# Patient Record
Sex: Female | Born: 1950
Health system: Southern US, Community
[De-identification: ages and names within clinical notes are randomized; demographics above are authoritative.]

## PROBLEM LIST (undated history)

## (undated) DIAGNOSIS — F329 Major depressive disorder, single episode, unspecified: Secondary | ICD-10-CM

## (undated) DIAGNOSIS — F4310 Post-traumatic stress disorder, unspecified: Secondary | ICD-10-CM

## (undated) DIAGNOSIS — I10 Essential (primary) hypertension: Secondary | ICD-10-CM

## (undated) DIAGNOSIS — N189 Chronic kidney disease, unspecified: Secondary | ICD-10-CM

## (undated) DIAGNOSIS — K859 Acute pancreatitis without necrosis or infection, unspecified: Secondary | ICD-10-CM

## (undated) DIAGNOSIS — R001 Bradycardia, unspecified: Secondary | ICD-10-CM

## (undated) DIAGNOSIS — I509 Heart failure, unspecified: Secondary | ICD-10-CM

## (undated) DIAGNOSIS — I495 Sick sinus syndrome: Secondary | ICD-10-CM

## (undated) DIAGNOSIS — C801 Malignant (primary) neoplasm, unspecified: Secondary | ICD-10-CM

## (undated) DIAGNOSIS — F32A Depression, unspecified: Secondary | ICD-10-CM

## (undated) HISTORY — DX: Major depressive disorder, single episode, unspecified: F32.9

## (undated) HISTORY — DX: Chronic kidney disease, unspecified: N18.9

## (undated) HISTORY — PX: APPENDECTOMY: SHX54

## (undated) HISTORY — DX: Post-traumatic stress disorder, unspecified: F43.10

## (undated) HISTORY — DX: Depression, unspecified: F32.A

## (undated) HISTORY — DX: Sick sinus syndrome: I49.5

## (undated) HISTORY — PX: ABDOMINAL HYSTERECTOMY: SHX81

---

## 2011-03-08 HISTORY — PX: PACEMAKER IMPLANT: EP1218

## 2014-07-26 DIAGNOSIS — N10 Acute tubulo-interstitial nephritis: Secondary | ICD-10-CM | POA: Insufficient documentation

## 2015-07-06 DIAGNOSIS — M2041 Other hammer toe(s) (acquired), right foot: Secondary | ICD-10-CM | POA: Diagnosis not present

## 2015-07-06 DIAGNOSIS — M79671 Pain in right foot: Secondary | ICD-10-CM | POA: Diagnosis not present

## 2015-07-06 DIAGNOSIS — M7741 Metatarsalgia, right foot: Secondary | ICD-10-CM | POA: Diagnosis not present

## 2015-07-07 DIAGNOSIS — M79671 Pain in right foot: Secondary | ICD-10-CM | POA: Diagnosis not present

## 2015-07-09 DIAGNOSIS — I495 Sick sinus syndrome: Secondary | ICD-10-CM | POA: Diagnosis not present

## 2015-07-09 DIAGNOSIS — I10 Essential (primary) hypertension: Secondary | ICD-10-CM | POA: Diagnosis not present

## 2015-08-04 DIAGNOSIS — F419 Anxiety disorder, unspecified: Secondary | ICD-10-CM | POA: Diagnosis not present

## 2015-08-04 DIAGNOSIS — Z950 Presence of cardiac pacemaker: Secondary | ICD-10-CM | POA: Diagnosis not present

## 2015-08-04 DIAGNOSIS — Z720 Tobacco use: Secondary | ICD-10-CM | POA: Diagnosis not present

## 2015-08-04 DIAGNOSIS — N200 Calculus of kidney: Secondary | ICD-10-CM | POA: Diagnosis not present

## 2015-08-04 DIAGNOSIS — F329 Major depressive disorder, single episode, unspecified: Secondary | ICD-10-CM | POA: Diagnosis not present

## 2015-08-04 DIAGNOSIS — I10 Essential (primary) hypertension: Secondary | ICD-10-CM | POA: Diagnosis not present

## 2015-08-04 DIAGNOSIS — M545 Low back pain: Secondary | ICD-10-CM | POA: Diagnosis not present

## 2015-08-04 DIAGNOSIS — M25532 Pain in left wrist: Secondary | ICD-10-CM | POA: Diagnosis not present

## 2015-09-08 DIAGNOSIS — F419 Anxiety disorder, unspecified: Secondary | ICD-10-CM | POA: Diagnosis not present

## 2015-09-08 DIAGNOSIS — R739 Hyperglycemia, unspecified: Secondary | ICD-10-CM | POA: Diagnosis not present

## 2015-09-08 DIAGNOSIS — F319 Bipolar disorder, unspecified: Secondary | ICD-10-CM | POA: Diagnosis not present

## 2015-09-08 DIAGNOSIS — N200 Calculus of kidney: Secondary | ICD-10-CM | POA: Diagnosis not present

## 2015-09-08 DIAGNOSIS — I10 Essential (primary) hypertension: Secondary | ICD-10-CM | POA: Diagnosis not present

## 2015-10-01 DIAGNOSIS — M899 Disorder of bone, unspecified: Secondary | ICD-10-CM | POA: Diagnosis not present

## 2015-10-01 DIAGNOSIS — M79671 Pain in right foot: Secondary | ICD-10-CM | POA: Diagnosis not present

## 2016-01-29 DIAGNOSIS — M25522 Pain in left elbow: Secondary | ICD-10-CM | POA: Diagnosis not present

## 2016-01-29 DIAGNOSIS — M545 Low back pain: Secondary | ICD-10-CM | POA: Diagnosis not present

## 2016-01-29 DIAGNOSIS — Z950 Presence of cardiac pacemaker: Secondary | ICD-10-CM | POA: Diagnosis not present

## 2016-01-29 DIAGNOSIS — E785 Hyperlipidemia, unspecified: Secondary | ICD-10-CM | POA: Diagnosis not present

## 2016-01-29 DIAGNOSIS — M25562 Pain in left knee: Secondary | ICD-10-CM | POA: Diagnosis not present

## 2016-01-29 DIAGNOSIS — T148 Other injury of unspecified body region: Secondary | ICD-10-CM | POA: Diagnosis not present

## 2016-01-29 DIAGNOSIS — W010XXA Fall on same level from slipping, tripping and stumbling without subsequent striking against object, initial encounter: Secondary | ICD-10-CM | POA: Diagnosis not present

## 2016-01-29 DIAGNOSIS — M25532 Pain in left wrist: Secondary | ICD-10-CM | POA: Diagnosis not present

## 2016-01-29 DIAGNOSIS — Z743 Need for continuous supervision: Secondary | ICD-10-CM | POA: Diagnosis not present

## 2016-01-29 DIAGNOSIS — F17210 Nicotine dependence, cigarettes, uncomplicated: Secondary | ICD-10-CM | POA: Diagnosis not present

## 2016-01-29 DIAGNOSIS — F419 Anxiety disorder, unspecified: Secondary | ICD-10-CM | POA: Diagnosis not present

## 2016-01-29 DIAGNOSIS — I10 Essential (primary) hypertension: Secondary | ICD-10-CM | POA: Diagnosis not present

## 2016-01-29 DIAGNOSIS — M25511 Pain in right shoulder: Secondary | ICD-10-CM | POA: Diagnosis not present

## 2016-01-29 DIAGNOSIS — T1490 Injury, unspecified: Secondary | ICD-10-CM | POA: Diagnosis not present

## 2016-02-07 DIAGNOSIS — M545 Low back pain: Secondary | ICD-10-CM | POA: Diagnosis not present

## 2016-02-07 DIAGNOSIS — J0100 Acute maxillary sinusitis, unspecified: Secondary | ICD-10-CM | POA: Diagnosis not present

## 2016-02-07 DIAGNOSIS — M25522 Pain in left elbow: Secondary | ICD-10-CM | POA: Diagnosis not present

## 2016-02-07 DIAGNOSIS — R109 Unspecified abdominal pain: Secondary | ICD-10-CM | POA: Diagnosis not present

## 2016-02-07 DIAGNOSIS — R4182 Altered mental status, unspecified: Secondary | ICD-10-CM | POA: Diagnosis not present

## 2016-02-07 DIAGNOSIS — S63502A Unspecified sprain of left wrist, initial encounter: Secondary | ICD-10-CM | POA: Diagnosis not present

## 2016-02-07 DIAGNOSIS — Z950 Presence of cardiac pacemaker: Secondary | ICD-10-CM | POA: Diagnosis not present

## 2016-02-07 DIAGNOSIS — F17200 Nicotine dependence, unspecified, uncomplicated: Secondary | ICD-10-CM | POA: Diagnosis not present

## 2016-02-07 DIAGNOSIS — I10 Essential (primary) hypertension: Secondary | ICD-10-CM | POA: Diagnosis not present

## 2016-02-07 DIAGNOSIS — W19XXXA Unspecified fall, initial encounter: Secondary | ICD-10-CM | POA: Diagnosis not present

## 2016-02-07 DIAGNOSIS — M25532 Pain in left wrist: Secondary | ICD-10-CM | POA: Diagnosis not present

## 2016-02-18 DIAGNOSIS — Z23 Encounter for immunization: Secondary | ICD-10-CM | POA: Diagnosis not present

## 2016-03-10 DIAGNOSIS — R079 Chest pain, unspecified: Secondary | ICD-10-CM | POA: Diagnosis not present

## 2016-03-10 DIAGNOSIS — F17210 Nicotine dependence, cigarettes, uncomplicated: Secondary | ICD-10-CM | POA: Diagnosis not present

## 2016-03-10 DIAGNOSIS — I10 Essential (primary) hypertension: Secondary | ICD-10-CM | POA: Diagnosis not present

## 2016-03-10 DIAGNOSIS — R0602 Shortness of breath: Secondary | ICD-10-CM | POA: Diagnosis not present

## 2016-03-24 DIAGNOSIS — I10 Essential (primary) hypertension: Secondary | ICD-10-CM | POA: Diagnosis not present

## 2016-03-24 DIAGNOSIS — R079 Chest pain, unspecified: Secondary | ICD-10-CM | POA: Diagnosis not present

## 2016-03-24 DIAGNOSIS — R0602 Shortness of breath: Secondary | ICD-10-CM | POA: Diagnosis not present

## 2016-03-29 DIAGNOSIS — R918 Other nonspecific abnormal finding of lung field: Secondary | ICD-10-CM | POA: Diagnosis not present

## 2016-03-29 DIAGNOSIS — I10 Essential (primary) hypertension: Secondary | ICD-10-CM | POA: Insufficient documentation

## 2016-03-29 DIAGNOSIS — Z8541 Personal history of malignant neoplasm of cervix uteri: Secondary | ICD-10-CM | POA: Insufficient documentation

## 2016-03-29 DIAGNOSIS — M79602 Pain in left arm: Secondary | ICD-10-CM | POA: Diagnosis not present

## 2016-03-29 DIAGNOSIS — N200 Calculus of kidney: Secondary | ICD-10-CM | POA: Insufficient documentation

## 2016-03-29 DIAGNOSIS — Z950 Presence of cardiac pacemaker: Secondary | ICD-10-CM | POA: Diagnosis not present

## 2016-04-06 DIAGNOSIS — Z1211 Encounter for screening for malignant neoplasm of colon: Secondary | ICD-10-CM | POA: Diagnosis not present

## 2016-04-06 DIAGNOSIS — R933 Abnormal findings on diagnostic imaging of other parts of digestive tract: Secondary | ICD-10-CM | POA: Diagnosis not present

## 2016-04-06 DIAGNOSIS — R1084 Generalized abdominal pain: Secondary | ICD-10-CM | POA: Diagnosis not present

## 2016-04-07 DIAGNOSIS — R0602 Shortness of breath: Secondary | ICD-10-CM | POA: Diagnosis not present

## 2016-04-07 DIAGNOSIS — Z950 Presence of cardiac pacemaker: Secondary | ICD-10-CM | POA: Diagnosis not present

## 2016-04-07 DIAGNOSIS — R918 Other nonspecific abnormal finding of lung field: Secondary | ICD-10-CM | POA: Diagnosis not present

## 2016-04-07 DIAGNOSIS — I10 Essential (primary) hypertension: Secondary | ICD-10-CM | POA: Diagnosis not present

## 2016-04-07 DIAGNOSIS — Z01810 Encounter for preprocedural cardiovascular examination: Secondary | ICD-10-CM | POA: Diagnosis not present

## 2016-04-14 DIAGNOSIS — Z79891 Long term (current) use of opiate analgesic: Secondary | ICD-10-CM | POA: Diagnosis not present

## 2016-05-04 DIAGNOSIS — R933 Abnormal findings on diagnostic imaging of other parts of digestive tract: Secondary | ICD-10-CM | POA: Diagnosis not present

## 2016-05-04 DIAGNOSIS — R1084 Generalized abdominal pain: Secondary | ICD-10-CM | POA: Diagnosis not present

## 2016-05-07 DIAGNOSIS — R109 Unspecified abdominal pain: Secondary | ICD-10-CM | POA: Diagnosis not present

## 2016-05-07 DIAGNOSIS — Z87442 Personal history of urinary calculi: Secondary | ICD-10-CM | POA: Diagnosis not present

## 2016-05-07 DIAGNOSIS — K859 Acute pancreatitis without necrosis or infection, unspecified: Secondary | ICD-10-CM | POA: Diagnosis not present

## 2016-05-07 DIAGNOSIS — K861 Other chronic pancreatitis: Secondary | ICD-10-CM | POA: Diagnosis not present

## 2016-05-07 DIAGNOSIS — M5136 Other intervertebral disc degeneration, lumbar region: Secondary | ICD-10-CM | POA: Diagnosis present

## 2016-05-07 DIAGNOSIS — I2510 Atherosclerotic heart disease of native coronary artery without angina pectoris: Secondary | ICD-10-CM | POA: Diagnosis present

## 2016-05-07 DIAGNOSIS — K8681 Exocrine pancreatic insufficiency: Secondary | ICD-10-CM | POA: Diagnosis not present

## 2016-05-07 DIAGNOSIS — K529 Noninfective gastroenteritis and colitis, unspecified: Secondary | ICD-10-CM | POA: Diagnosis not present

## 2016-05-07 DIAGNOSIS — C539 Malignant neoplasm of cervix uteri, unspecified: Secondary | ICD-10-CM | POA: Diagnosis not present

## 2016-05-07 DIAGNOSIS — R918 Other nonspecific abnormal finding of lung field: Secondary | ICD-10-CM | POA: Diagnosis present

## 2016-05-07 DIAGNOSIS — F419 Anxiety disorder, unspecified: Secondary | ICD-10-CM | POA: Diagnosis present

## 2016-05-07 DIAGNOSIS — K8689 Other specified diseases of pancreas: Secondary | ICD-10-CM | POA: Diagnosis not present

## 2016-05-07 DIAGNOSIS — R079 Chest pain, unspecified: Secondary | ICD-10-CM | POA: Diagnosis not present

## 2016-05-07 DIAGNOSIS — N2889 Other specified disorders of kidney and ureter: Secondary | ICD-10-CM | POA: Diagnosis not present

## 2016-05-07 DIAGNOSIS — K589 Irritable bowel syndrome without diarrhea: Secondary | ICD-10-CM | POA: Diagnosis not present

## 2016-05-07 DIAGNOSIS — F17200 Nicotine dependence, unspecified, uncomplicated: Secondary | ICD-10-CM | POA: Diagnosis present

## 2016-05-07 DIAGNOSIS — I10 Essential (primary) hypertension: Secondary | ICD-10-CM | POA: Diagnosis present

## 2016-05-07 DIAGNOSIS — R51 Headache: Secondary | ICD-10-CM | POA: Diagnosis not present

## 2016-05-07 DIAGNOSIS — Z90710 Acquired absence of both cervix and uterus: Secondary | ICD-10-CM | POA: Diagnosis not present

## 2016-05-24 DIAGNOSIS — I10 Essential (primary) hypertension: Secondary | ICD-10-CM | POA: Diagnosis not present

## 2016-05-24 DIAGNOSIS — N200 Calculus of kidney: Secondary | ICD-10-CM | POA: Diagnosis not present

## 2016-05-24 DIAGNOSIS — R109 Unspecified abdominal pain: Secondary | ICD-10-CM | POA: Diagnosis not present

## 2016-05-25 DIAGNOSIS — Z882 Allergy status to sulfonamides status: Secondary | ICD-10-CM | POA: Diagnosis not present

## 2016-05-25 DIAGNOSIS — F17200 Nicotine dependence, unspecified, uncomplicated: Secondary | ICD-10-CM | POA: Diagnosis not present

## 2016-05-25 DIAGNOSIS — S99911A Unspecified injury of right ankle, initial encounter: Secondary | ICD-10-CM | POA: Diagnosis not present

## 2016-05-25 DIAGNOSIS — X501XXA Overexertion from prolonged static or awkward postures, initial encounter: Secondary | ICD-10-CM | POA: Diagnosis not present

## 2016-05-25 DIAGNOSIS — S93601A Unspecified sprain of right foot, initial encounter: Secondary | ICD-10-CM | POA: Diagnosis not present

## 2016-05-25 DIAGNOSIS — S93401A Sprain of unspecified ligament of right ankle, initial encounter: Secondary | ICD-10-CM | POA: Diagnosis not present

## 2016-05-25 DIAGNOSIS — Z8541 Personal history of malignant neoplasm of cervix uteri: Secondary | ICD-10-CM | POA: Diagnosis not present

## 2016-05-25 DIAGNOSIS — Y92410 Unspecified street and highway as the place of occurrence of the external cause: Secondary | ICD-10-CM | POA: Diagnosis not present

## 2016-05-25 DIAGNOSIS — S99921A Unspecified injury of right foot, initial encounter: Secondary | ICD-10-CM | POA: Diagnosis not present

## 2016-06-16 DIAGNOSIS — K8689 Other specified diseases of pancreas: Secondary | ICD-10-CM | POA: Diagnosis not present

## 2016-06-16 DIAGNOSIS — Z8719 Personal history of other diseases of the digestive system: Secondary | ICD-10-CM | POA: Diagnosis not present

## 2016-06-16 DIAGNOSIS — K859 Acute pancreatitis without necrosis or infection, unspecified: Secondary | ICD-10-CM | POA: Diagnosis not present

## 2016-06-22 DIAGNOSIS — R933 Abnormal findings on diagnostic imaging of other parts of digestive tract: Secondary | ICD-10-CM | POA: Diagnosis not present

## 2016-06-22 DIAGNOSIS — R1084 Generalized abdominal pain: Secondary | ICD-10-CM | POA: Diagnosis not present

## 2016-06-27 DIAGNOSIS — R109 Unspecified abdominal pain: Secondary | ICD-10-CM | POA: Diagnosis not present

## 2016-06-27 DIAGNOSIS — Z01810 Encounter for preprocedural cardiovascular examination: Secondary | ICD-10-CM | POA: Diagnosis not present

## 2016-06-27 DIAGNOSIS — R918 Other nonspecific abnormal finding of lung field: Secondary | ICD-10-CM | POA: Diagnosis not present

## 2016-06-27 DIAGNOSIS — I10 Essential (primary) hypertension: Secondary | ICD-10-CM | POA: Diagnosis not present

## 2016-06-27 DIAGNOSIS — E7800 Pure hypercholesterolemia, unspecified: Secondary | ICD-10-CM | POA: Diagnosis not present

## 2016-06-27 DIAGNOSIS — R0602 Shortness of breath: Secondary | ICD-10-CM | POA: Diagnosis not present

## 2016-06-27 DIAGNOSIS — Z01818 Encounter for other preprocedural examination: Secondary | ICD-10-CM | POA: Diagnosis not present

## 2016-07-01 DIAGNOSIS — I10 Essential (primary) hypertension: Secondary | ICD-10-CM | POA: Diagnosis not present

## 2016-07-01 DIAGNOSIS — Z8541 Personal history of malignant neoplasm of cervix uteri: Secondary | ICD-10-CM | POA: Diagnosis not present

## 2016-07-01 DIAGNOSIS — F17210 Nicotine dependence, cigarettes, uncomplicated: Secondary | ICD-10-CM | POA: Diagnosis not present

## 2016-07-01 DIAGNOSIS — K8689 Other specified diseases of pancreas: Secondary | ICD-10-CM | POA: Diagnosis not present

## 2016-07-01 DIAGNOSIS — K2950 Unspecified chronic gastritis without bleeding: Secondary | ICD-10-CM | POA: Diagnosis not present

## 2016-07-01 DIAGNOSIS — K838 Other specified diseases of biliary tract: Secondary | ICD-10-CM | POA: Diagnosis not present

## 2016-07-01 DIAGNOSIS — Z950 Presence of cardiac pacemaker: Secondary | ICD-10-CM | POA: Diagnosis not present

## 2016-07-01 DIAGNOSIS — Z90710 Acquired absence of both cervix and uterus: Secondary | ICD-10-CM | POA: Diagnosis not present

## 2016-07-01 DIAGNOSIS — K2970 Gastritis, unspecified, without bleeding: Secondary | ICD-10-CM | POA: Diagnosis not present

## 2016-07-11 DIAGNOSIS — M79602 Pain in left arm: Secondary | ICD-10-CM | POA: Diagnosis not present

## 2016-07-11 DIAGNOSIS — M25529 Pain in unspecified elbow: Secondary | ICD-10-CM | POA: Diagnosis not present

## 2016-07-11 DIAGNOSIS — M79609 Pain in unspecified limb: Secondary | ICD-10-CM | POA: Diagnosis not present

## 2016-07-11 DIAGNOSIS — M545 Low back pain: Secondary | ICD-10-CM | POA: Diagnosis not present

## 2016-08-01 DIAGNOSIS — I10 Essential (primary) hypertension: Secondary | ICD-10-CM | POA: Diagnosis not present

## 2016-08-01 DIAGNOSIS — K869 Disease of pancreas, unspecified: Secondary | ICD-10-CM | POA: Diagnosis not present

## 2016-08-01 DIAGNOSIS — R1033 Periumbilical pain: Secondary | ICD-10-CM | POA: Diagnosis not present

## 2016-08-01 DIAGNOSIS — N200 Calculus of kidney: Secondary | ICD-10-CM | POA: Diagnosis not present

## 2016-08-08 DIAGNOSIS — R200 Anesthesia of skin: Secondary | ICD-10-CM | POA: Diagnosis not present

## 2016-08-08 DIAGNOSIS — M25529 Pain in unspecified elbow: Secondary | ICD-10-CM | POA: Diagnosis not present

## 2016-08-08 DIAGNOSIS — M545 Low back pain: Secondary | ICD-10-CM | POA: Diagnosis not present

## 2016-08-08 DIAGNOSIS — M779 Enthesopathy, unspecified: Secondary | ICD-10-CM | POA: Diagnosis not present

## 2016-10-07 ENCOUNTER — Ambulatory Visit (INDEPENDENT_AMBULATORY_CARE_PROVIDER_SITE_OTHER)
Admission: EM | Admit: 2016-10-07 | Discharge: 2016-10-07 | Disposition: A | Discharge status: Home / Self Care | Payer: Medicare Other | Source: Home / Self Care | Attending: Family Medicine | Admitting: Family Medicine

## 2016-10-07 ENCOUNTER — Encounter: Payer: Self-pay | Admitting: *Deleted

## 2016-10-07 ENCOUNTER — Emergency Department
Admission: EM | Admit: 2016-10-07 | Discharge: 2016-10-07 | Disposition: A | Discharge status: Home / Self Care | Payer: Medicare Other | Attending: Emergency Medicine | Admitting: Emergency Medicine

## 2016-10-07 DIAGNOSIS — Z951 Presence of aortocoronary bypass graft: Secondary | ICD-10-CM | POA: Insufficient documentation

## 2016-10-07 DIAGNOSIS — F17210 Nicotine dependence, cigarettes, uncomplicated: Secondary | ICD-10-CM | POA: Insufficient documentation

## 2016-10-07 DIAGNOSIS — Z950 Presence of cardiac pacemaker: Secondary | ICD-10-CM | POA: Insufficient documentation

## 2016-10-07 DIAGNOSIS — G8929 Other chronic pain: Secondary | ICD-10-CM

## 2016-10-07 DIAGNOSIS — I10 Essential (primary) hypertension: Secondary | ICD-10-CM | POA: Insufficient documentation

## 2016-10-07 DIAGNOSIS — K859 Acute pancreatitis without necrosis or infection, unspecified: Secondary | ICD-10-CM | POA: Insufficient documentation

## 2016-10-07 DIAGNOSIS — B9789 Other viral agents as the cause of diseases classified elsewhere: Secondary | ICD-10-CM

## 2016-10-07 DIAGNOSIS — R05 Cough: Secondary | ICD-10-CM | POA: Insufficient documentation

## 2016-10-07 DIAGNOSIS — Z79899 Other long term (current) drug therapy: Secondary | ICD-10-CM | POA: Diagnosis not present

## 2016-10-07 DIAGNOSIS — Z859 Personal history of malignant neoplasm, unspecified: Secondary | ICD-10-CM | POA: Diagnosis not present

## 2016-10-07 DIAGNOSIS — R0981 Nasal congestion: Secondary | ICD-10-CM | POA: Insufficient documentation

## 2016-10-07 DIAGNOSIS — K8500 Idiopathic acute pancreatitis without necrosis or infection: Secondary | ICD-10-CM | POA: Insufficient documentation

## 2016-10-07 DIAGNOSIS — J069 Acute upper respiratory infection, unspecified: Secondary | ICD-10-CM

## 2016-10-07 DIAGNOSIS — R109 Unspecified abdominal pain: Secondary | ICD-10-CM

## 2016-10-07 DIAGNOSIS — R1030 Lower abdominal pain, unspecified: Secondary | ICD-10-CM | POA: Diagnosis present

## 2016-10-07 HISTORY — DX: Essential (primary) hypertension: I10

## 2016-10-07 HISTORY — DX: Acute pancreatitis without necrosis or infection, unspecified: K85.90

## 2016-10-07 HISTORY — DX: Malignant (primary) neoplasm, unspecified: C80.1

## 2016-10-07 LAB — URINALYSIS, COMPLETE (UACMP) WITH MICROSCOPIC
BACTERIA UA: NONE SEEN
BILIRUBIN URINE: NEGATIVE
Bacteria, UA: NONE SEEN
Bilirubin Urine: NEGATIVE
Glucose, UA: NEGATIVE mg/dL
Glucose, UA: NEGATIVE mg/dL
HGB URINE DIPSTICK: NEGATIVE
HGB URINE DIPSTICK: NEGATIVE
Ketones, ur: NEGATIVE mg/dL
Ketones, ur: NEGATIVE mg/dL
Leukocytes, UA: NEGATIVE
Leukocytes, UA: NEGATIVE
Nitrite: NEGATIVE
Nitrite: NEGATIVE
PH: 5 (ref 5.0–8.0)
PROTEIN: NEGATIVE mg/dL
Protein, ur: 100 mg/dL — AB
Specific Gravity, Urine: 1.015 (ref 1.005–1.030)
Specific Gravity, Urine: 1.021 (ref 1.005–1.030)
WBC UA: NONE SEEN WBC/hpf (ref 0–5)
pH: 6 (ref 5.0–8.0)

## 2016-10-07 LAB — COMPREHENSIVE METABOLIC PANEL
ALT: 21 U/L (ref 14–54)
ANION GAP: 6 (ref 5–15)
AST: 25 U/L (ref 15–41)
Albumin: 3.9 g/dL (ref 3.5–5.0)
Alkaline Phosphatase: 89 U/L (ref 38–126)
BUN: 16 mg/dL (ref 6–20)
CHLORIDE: 107 mmol/L (ref 101–111)
CO2: 28 mmol/L (ref 22–32)
Calcium: 9.2 mg/dL (ref 8.9–10.3)
Creatinine, Ser: 0.98 mg/dL (ref 0.44–1.00)
GFR, EST NON AFRICAN AMERICAN: 58 mL/min — AB (ref >60)
Glucose, Bld: 93 mg/dL (ref 65–99)
POTASSIUM: 3.7 mmol/L (ref 3.5–5.1)
Sodium: 141 mmol/L (ref 135–145)
Total Bilirubin: 0.5 mg/dL (ref 0.3–1.2)
Total Protein: 7.2 g/dL (ref 6.5–8.1)

## 2016-10-07 LAB — CBC
HEMATOCRIT: 42.2 % (ref 35.0–47.0)
HEMOGLOBIN: 14.2 g/dL (ref 12.0–16.0)
MCH: 30 pg (ref 26.0–34.0)
MCHC: 33.7 g/dL (ref 32.0–36.0)
MCV: 89.1 fL (ref 80.0–100.0)
Platelets: 240 10*3/uL (ref 150–440)
RBC: 4.74 MIL/uL (ref 3.80–5.20)
RDW: 13.9 % (ref 11.5–14.5)
WBC: 7.8 10*3/uL (ref 3.6–11.0)

## 2016-10-07 LAB — LIPASE, BLOOD: LIPASE: 61 U/L — AB (ref 11–51)

## 2016-10-07 MED ORDER — HYDROMORPHONE HCL 1 MG/ML IJ SOLN
1.0000 mg | Freq: Once | INTRAMUSCULAR | Status: AC
  Administered 2016-10-07: 1 mg via INTRAMUSCULAR
  Filled 2016-10-07 (×2): qty 1

## 2016-10-07 MED ORDER — OXYCODONE-ACETAMINOPHEN 5-325 MG PO TABS: 2.0000 | ORAL_TABLET | Freq: Four times a day (QID) | ORAL | 0 refills | Status: DC | PRN

## 2016-10-07 NOTE — ED Provider Notes (Signed)
Lori Orr       Time seen: ----------------------------------------- 9:06 PM on 10/07/2016 -----------------------------------------     I have reviewed the triage vital signs and the nursing notes.   HISTORY   Chief Complaint Abdominal Pain    HPI Lori Orr is a 66 y.o. female who presents to the ED for abdominal pain. Patient reports a history of pancreatitis she currently has some lower abdominal pain. She was in an urgent care today and sent here for further evaluation. She has had some nausea and vomiting but denies any currently. Patient was hospitalized recently in [place] pancreatitis. She sees a GI Dr. Marlis Orr where she is from. She denies fevers, chills or other complaints. Currently pain is 10 out of 10.   Past Medical History:  Diagnosis Date  . Cancer ([place])   . Hypertension   . Pacemaker   . Pancreatitis     There are no active problems to display for this patient.   Past Surgical History:  Procedure Laterality Date  . ABDOMINAL HYSTERECTOMY    . APPENDECTOMY    . CARDIAC SURGERY      Allergies Sulfa antibiotics  Social History Social History  Substance Use Topics  . Smoking status: Current Every Day Smoker    Packs/day: 0.50    Types: Cigarettes  . Smokeless tobacco: Never Used  . Alcohol use No    Review of Systems Constitutional: Negative for fever. Eyes: Negative for vision changes ENT:  Negative for congestion, sore throat Cardiovascular: Negative for chest pain. Respiratory: Negative for shortness of breath. Gastrointestinal: Positive for abdominal pain, vomiting Genitourinary: Negative for dysuria. Musculoskeletal: Negative for back pain. Skin: Negative for rash. Neurological: Negative for headaches, focal weakness or numbness.  All systems negative/normal/unremarkable except as stated in the HPI  ____________________________________________   PHYSICAL  EXAM:  VITAL SIGNS: ED Triage Vitals [10/07/16 1840]  Enc Vitals Group     BP 138/73     Pulse Rate (!) 58     Resp 20     Temp 98.4 F (36.9 C)     Temp Source Oral     SpO2 97 %     Weight      Height      Head Circumference      Peak Flow      Pain Score 10     Pain Loc      Pain Edu?      Excl. in [place]?     Constitutional: Alert and oriented. Mild distress Eyes: Conjunctivae are normal. PERRL. Normal extraocular movements. ENT   Head: Normocephalic and atraumatic.   Nose: No congestion/rhinnorhea.   Mouth/Throat: Mucous membranes are moist.   Neck: No stridor. Cardiovascular: Normal rate, regular rhythm. No murmurs, rubs, or gallops. Respiratory: Normal respiratory effort without tachypnea nor retractions. Breath sounds are clear and equal bilaterally. No wheezes/rales/rhonchi. Gastrointestinal: Mild abdominal tenderness, no rebound or guarding. Normal bowel sounds. Musculoskeletal: Nontender with normal range of motion in extremities. No lower extremity tenderness nor edema. Neurologic:  Normal speech and language. No gross focal neurologic deficits are appreciated.  Skin:  Skin is warm, dry and intact. No rash noted. Psychiatric: Mood and affect are normal. Speech and behavior are normal.  ____________________________________________  ED COURSE:  Pertinent labs & imaging results that were available during my care of the patient were reviewed by me and considered in my medical decision making (see chart for details). Patient presents for abdominal pain, we will  assess with labs and imaging as indicated.   Procedures ____________________________________________   LABS (pertinent positives/negatives)  Labs Reviewed  LIPASE, BLOOD - Abnormal; Notable for the following:       Result Value   Lipase 61 (*)    All other components within normal limits  COMPREHENSIVE METABOLIC PANEL - Abnormal; Notable for the following:    GFR calc non Af Amer 58 (*)     All other components within normal limits  URINALYSIS, COMPLETE (UACMP) WITH MICROSCOPIC - Abnormal; Notable for the following:    Color, Urine YELLOW (*)    APPearance CLEAR (*)    Protein, ur 100 (*)    Squamous Epithelial / LPF 0-5 (*)    All other components within normal limits  CBC    ____________________________________________  FINAL ASSESSMENT AND PLAN  Mild pancreatitis  Plan: Patient's labs were dictated above. Patient had presented for abdominal pain with known history of pancreatitis for which she sees gastroenterology. Her labs are consistent with mild pancreatitis at this time. Patient states she has not nor has she ever had alcohol abuse or had significant alcohol intake. I will prescribe a short supply pain medicine and refer her to local GI for follow-up.   Lori Newport, MD   Orr: This Orr was generated in part or whole with voice recognition software. Voice recognition is usually quite accurate but there are transcription errors that can and very often do occur. I apologize for any typographical errors that were not detected and corrected.     Lori Newport, MD 10/07/16 2108

## 2016-10-07 NOTE — ED Notes (Signed)
Called patient x 3, no answer 

## 2016-10-07 NOTE — ED Triage Notes (Signed)
Patient has had symptoms of nasal congestion, drainage, and lower abdominal pain for over a month. Patient has extensive cardiac and pancreatitis.

## 2016-10-07 NOTE — ED Triage Notes (Signed)
Pt states hx of pancreatitis. Pt states lower abd pain. Seen at [facility] today and sent here. Pt states some emesis. States she saw blood in urine, but UC stated no blood in urine. Pt states was hospitalized recently for pancreatitis. Had colonoscopy scheduled but canceled d/t family issues. Sees GI doc in [place] where she is from. Alert, oriented, ambulatory. Has taken tylenol, ibuprofen.

## 2016-10-07 NOTE — ED Provider Notes (Signed)
[facility]    CSN: [id number] Arrival date & time: 10/07/16  1444     History   Chief Complaint Chief Complaint  Patient presents with  . Abdominal Pain  . Nasal Congestion    HPI Carlene Bickley is a 66 y.o. female.   The history is provided by the patient.  URI  Presenting symptoms: congestion, fatigue and rhinorrhea   Severity:  Moderate Onset quality:  Sudden Duration:  1 week (abdominal pain symptoms are chronic (for months)) Timing:  Constant Chronicity:  New Relieved by:  Nothing Worsened by:  Nothing Associated symptoms: no arthralgias and no headaches   Associated symptoms comment:  Abdominal pains Risk factors: not elderly, no chronic cardiac disease, no chronic kidney disease, no chronic respiratory disease, no diabetes mellitus, no immunosuppression, no recent illness, no recent travel and no sick contacts     Past Medical History:  Diagnosis Date  . Cancer ([place])   . Hypertension   . Pacemaker   . Pancreatitis     There are no active problems to display for this patient.   Past Surgical History:  Procedure Laterality Date  . ABDOMINAL HYSTERECTOMY    . APPENDECTOMY    . CARDIAC SURGERY      OB History    No data available       Home Medications    Prior to Admission medications   Medication Sig Start Date End Date Taking? Authorizing Provider  amLODipine (NORVASC) 10 MG tablet Take 10 mg by mouth daily.   Yes Historical Provider, MD  lisinopril (PRINIVIL,ZESTRIL) 40 MG tablet Take 40 mg by mouth daily.   Yes Historical Provider, MD  metoprolol (TOPROL-XL) 200 MG 24 hr tablet Take 200 mg by mouth daily.   Yes Historical Provider, MD    Family History Family History  Problem Relation Age of Onset  . Dementia Mother   . Heart attack Father     Social History Social History  Substance Use Topics  . Smoking status: Current Every Day Smoker    Packs/day: 0.50    Types: Cigarettes  . Smokeless tobacco: Never Used  .  Alcohol use No     Allergies   Sulfa antibiotics   Review of Systems Review of Systems  Constitutional: Positive for fatigue.  HENT: Positive for congestion and rhinorrhea.   Musculoskeletal: Negative for arthralgias.  Neurological: Negative for headaches.     Physical Exam Triage Vital Signs ED Triage Vitals  Enc Vitals Group     BP 10/07/16 1459 140/79     Pulse Rate 10/07/16 1459 60     Resp 10/07/16 1459 16     Temp 10/07/16 1459 98.5 F (36.9 C)     Temp Source 10/07/16 1459 Oral     SpO2 10/07/16 1459 98 %     Weight 10/07/16 1500 140 lb (63.5 kg)     Height 10/07/16 1500 5\' 2"  (1.575 m)     Head Circumference --      Peak Flow --      Pain Score 10/07/16 1503 9     Pain Loc --      Pain Edu? --      Excl. in [place]? --    No data found.   Updated Vital Signs BP 140/79 (BP Location: Left Arm)   Pulse 60   Temp 98.5 F (36.9 C) (Oral)   Resp 16   Ht 5\' 2"  (1.575 m)   Wt 140 lb (63.5 kg)  SpO2 98%   BMI 25.61 kg/m   Visual Acuity Right Eye Distance:   Left Eye Distance:   Bilateral Distance:    Right Eye Near:   Left Eye Near:    Bilateral Near:     Physical Exam  Constitutional: She is oriented to person, place, and time. She appears well-developed and well-nourished. No distress.  HENT:  Head: Normocephalic.  Right Ear: Tympanic membrane, external ear and ear canal normal.  Left Ear: Tympanic membrane, external ear and ear canal normal.  Nose: Nose normal.  Mouth/Throat: Oropharynx is clear and moist and mucous membranes are normal.  Eyes: Conjunctivae and EOM are normal. Pupils are equal, round, and reactive to light. Right eye exhibits no discharge. Left eye exhibits no discharge. No scleral icterus.  Neck: Normal range of motion. Neck supple. No JVD present. No tracheal deviation present. No thyromegaly present.  Cardiovascular: Normal rate, regular rhythm, normal heart sounds and intact distal pulses.   No murmur heard. Pulmonary/Chest:  Effort normal and breath sounds normal. No stridor. No respiratory distress. She has no wheezes. She has no rales. She exhibits no tenderness.  Abdominal: Soft. Bowel sounds are normal. She exhibits no distension and no mass. There is no tenderness. There is no rebound and no guarding.  Musculoskeletal: She exhibits no edema or tenderness.  Lymphadenopathy:    She has no cervical adenopathy.  Neurological: She is alert and oriented to person, place, and time. She has normal reflexes.  Skin: Skin is warm and dry. No rash noted. She is not diaphoretic. No erythema. No pallor.  Nursing note and vitals reviewed.    UC Treatments / Results  Labs (all labs ordered are listed, but only abnormal results are displayed) Labs Reviewed  URINALYSIS, COMPLETE (UACMP) WITH MICROSCOPIC - Abnormal; Notable for the following:       Result Value   Squamous Epithelial / LPF 0-5 (*)    All other components within normal limits    EKG  EKG Interpretation None       Radiology No results found.  Procedures Procedures (including critical care time)  Medications Ordered in UC Medications - No data to display   Initial Impression / Assessment and Plan / UC Course  I have reviewed the triage vital signs and the nursing notes.  Pertinent labs & imaging results that were available during my care of the patient were reviewed by me and considered in my medical decision making (see chart for details).       Final Clinical Impressions(s) / UC Diagnoses   Final diagnoses:  Viral URI with cough  Chronic abdominal pain    New Prescriptions Discharge Medication List as of 10/07/2016  3:41 PM     1. Labs results and diagnosis reviewed with patient 2. Recommend supportive treatment with otc acetaminophen, fluids,rest 3. Follow-up prn if symptoms worsen or don't improve   Norval Gable, MD 10/07/16 1715

## 2016-10-07 NOTE — ED Notes (Signed)
Pt discharged to home.  Family member driving.  Discharge instructions reviewed.  Verbalized understanding.  No questions or concerns at this time.  Teach back verified.  Pt in NAD.  No items left in ED.   

## 2016-11-17 ENCOUNTER — Ambulatory Visit: Payer: Medicare Other | Admitting: Gastroenterology

## 2016-11-17 ENCOUNTER — Encounter: Payer: Self-pay | Admitting: Gastroenterology

## 2016-12-30 DIAGNOSIS — E785 Hyperlipidemia, unspecified: Secondary | ICD-10-CM | POA: Diagnosis not present

## 2016-12-30 DIAGNOSIS — F17210 Nicotine dependence, cigarettes, uncomplicated: Secondary | ICD-10-CM | POA: Diagnosis not present

## 2016-12-30 DIAGNOSIS — F0390 Unspecified dementia without behavioral disturbance: Secondary | ICD-10-CM | POA: Diagnosis not present

## 2016-12-30 DIAGNOSIS — R109 Unspecified abdominal pain: Secondary | ICD-10-CM | POA: Diagnosis not present

## 2016-12-30 DIAGNOSIS — I10 Essential (primary) hypertension: Secondary | ICD-10-CM | POA: Diagnosis not present

## 2016-12-30 DIAGNOSIS — F319 Bipolar disorder, unspecified: Secondary | ICD-10-CM | POA: Diagnosis not present

## 2016-12-30 DIAGNOSIS — R1013 Epigastric pain: Secondary | ICD-10-CM | POA: Diagnosis not present

## 2016-12-30 DIAGNOSIS — N200 Calculus of kidney: Secondary | ICD-10-CM | POA: Diagnosis not present

## 2016-12-30 DIAGNOSIS — F418 Other specified anxiety disorders: Secondary | ICD-10-CM | POA: Diagnosis not present

## 2016-12-30 DIAGNOSIS — E039 Hypothyroidism, unspecified: Secondary | ICD-10-CM | POA: Diagnosis not present

## 2017-01-05 DIAGNOSIS — Z79899 Other long term (current) drug therapy: Secondary | ICD-10-CM | POA: Diagnosis not present

## 2017-01-05 DIAGNOSIS — I10 Essential (primary) hypertension: Secondary | ICD-10-CM | POA: Diagnosis not present

## 2017-01-05 DIAGNOSIS — R1012 Left upper quadrant pain: Secondary | ICD-10-CM | POA: Diagnosis not present

## 2017-01-05 DIAGNOSIS — R079 Chest pain, unspecified: Secondary | ICD-10-CM | POA: Diagnosis not present

## 2017-01-05 DIAGNOSIS — F0390 Unspecified dementia without behavioral disturbance: Secondary | ICD-10-CM | POA: Diagnosis not present

## 2017-01-05 DIAGNOSIS — Z87442 Personal history of urinary calculi: Secondary | ICD-10-CM | POA: Diagnosis not present

## 2017-01-05 DIAGNOSIS — F17210 Nicotine dependence, cigarettes, uncomplicated: Secondary | ICD-10-CM | POA: Diagnosis not present

## 2017-01-05 DIAGNOSIS — E785 Hyperlipidemia, unspecified: Secondary | ICD-10-CM | POA: Diagnosis not present

## 2017-01-05 DIAGNOSIS — E039 Hypothyroidism, unspecified: Secondary | ICD-10-CM | POA: Diagnosis not present

## 2017-01-05 DIAGNOSIS — E278 Other specified disorders of adrenal gland: Secondary | ICD-10-CM | POA: Diagnosis not present

## 2017-01-05 DIAGNOSIS — Z950 Presence of cardiac pacemaker: Secondary | ICD-10-CM | POA: Diagnosis not present

## 2017-01-05 DIAGNOSIS — R109 Unspecified abdominal pain: Secondary | ICD-10-CM | POA: Diagnosis not present

## 2017-01-05 DIAGNOSIS — K861 Other chronic pancreatitis: Secondary | ICD-10-CM | POA: Diagnosis not present

## 2017-01-05 DIAGNOSIS — N200 Calculus of kidney: Secondary | ICD-10-CM | POA: Diagnosis not present

## 2017-01-05 DIAGNOSIS — R918 Other nonspecific abnormal finding of lung field: Secondary | ICD-10-CM | POA: Diagnosis not present

## 2017-01-05 DIAGNOSIS — F419 Anxiety disorder, unspecified: Secondary | ICD-10-CM | POA: Diagnosis not present

## 2017-01-05 DIAGNOSIS — R0789 Other chest pain: Secondary | ICD-10-CM | POA: Diagnosis not present

## 2017-01-05 DIAGNOSIS — R112 Nausea with vomiting, unspecified: Secondary | ICD-10-CM | POA: Diagnosis not present

## 2017-01-05 DIAGNOSIS — E279 Disorder of adrenal gland, unspecified: Secondary | ICD-10-CM | POA: Diagnosis not present

## 2017-01-06 DIAGNOSIS — K861 Other chronic pancreatitis: Secondary | ICD-10-CM | POA: Diagnosis not present

## 2017-01-06 DIAGNOSIS — R918 Other nonspecific abnormal finding of lung field: Secondary | ICD-10-CM | POA: Diagnosis not present

## 2017-01-06 DIAGNOSIS — E279 Disorder of adrenal gland, unspecified: Secondary | ICD-10-CM | POA: Diagnosis not present

## 2017-01-06 DIAGNOSIS — R079 Chest pain, unspecified: Secondary | ICD-10-CM | POA: Diagnosis not present

## 2017-01-06 DIAGNOSIS — N200 Calculus of kidney: Secondary | ICD-10-CM | POA: Diagnosis not present

## 2017-01-06 DIAGNOSIS — R109 Unspecified abdominal pain: Secondary | ICD-10-CM | POA: Diagnosis not present

## 2017-01-07 DIAGNOSIS — K861 Other chronic pancreatitis: Secondary | ICD-10-CM | POA: Diagnosis not present

## 2017-01-07 DIAGNOSIS — N200 Calculus of kidney: Secondary | ICD-10-CM | POA: Diagnosis not present

## 2017-01-07 DIAGNOSIS — R079 Chest pain, unspecified: Secondary | ICD-10-CM | POA: Diagnosis not present

## 2017-01-07 DIAGNOSIS — E279 Disorder of adrenal gland, unspecified: Secondary | ICD-10-CM | POA: Diagnosis not present

## 2017-01-07 DIAGNOSIS — R918 Other nonspecific abnormal finding of lung field: Secondary | ICD-10-CM | POA: Diagnosis not present

## 2017-01-07 DIAGNOSIS — R109 Unspecified abdominal pain: Secondary | ICD-10-CM | POA: Diagnosis not present

## 2017-01-08 DIAGNOSIS — F0390 Unspecified dementia without behavioral disturbance: Secondary | ICD-10-CM | POA: Diagnosis not present

## 2017-01-08 DIAGNOSIS — F17210 Nicotine dependence, cigarettes, uncomplicated: Secondary | ICD-10-CM | POA: Diagnosis not present

## 2017-01-08 DIAGNOSIS — E039 Hypothyroidism, unspecified: Secondary | ICD-10-CM | POA: Diagnosis not present

## 2017-01-08 DIAGNOSIS — Z87442 Personal history of urinary calculi: Secondary | ICD-10-CM | POA: Diagnosis not present

## 2017-01-08 DIAGNOSIS — Z90710 Acquired absence of both cervix and uterus: Secondary | ICD-10-CM | POA: Diagnosis not present

## 2017-01-08 DIAGNOSIS — I2510 Atherosclerotic heart disease of native coronary artery without angina pectoris: Secondary | ICD-10-CM | POA: Diagnosis not present

## 2017-01-08 DIAGNOSIS — G8929 Other chronic pain: Secondary | ICD-10-CM | POA: Diagnosis not present

## 2017-01-08 DIAGNOSIS — Z950 Presence of cardiac pacemaker: Secondary | ICD-10-CM | POA: Diagnosis not present

## 2017-01-08 DIAGNOSIS — F319 Bipolar disorder, unspecified: Secondary | ICD-10-CM | POA: Diagnosis not present

## 2017-01-08 DIAGNOSIS — Z882 Allergy status to sulfonamides status: Secondary | ICD-10-CM | POA: Diagnosis not present

## 2017-01-08 DIAGNOSIS — M545 Low back pain: Secondary | ICD-10-CM | POA: Diagnosis not present

## 2017-01-08 DIAGNOSIS — E785 Hyperlipidemia, unspecified: Secondary | ICD-10-CM | POA: Diagnosis not present

## 2017-01-08 DIAGNOSIS — M25532 Pain in left wrist: Secondary | ICD-10-CM | POA: Diagnosis not present

## 2017-01-08 DIAGNOSIS — R109 Unspecified abdominal pain: Secondary | ICD-10-CM | POA: Diagnosis not present

## 2017-01-08 DIAGNOSIS — R1013 Epigastric pain: Secondary | ICD-10-CM | POA: Diagnosis not present

## 2017-01-08 DIAGNOSIS — N179 Acute kidney failure, unspecified: Secondary | ICD-10-CM | POA: Diagnosis not present

## 2017-01-08 DIAGNOSIS — E7800 Pure hypercholesterolemia, unspecified: Secondary | ICD-10-CM | POA: Diagnosis not present

## 2017-01-08 DIAGNOSIS — F419 Anxiety disorder, unspecified: Secondary | ICD-10-CM | POA: Diagnosis not present

## 2017-01-08 DIAGNOSIS — K861 Other chronic pancreatitis: Secondary | ICD-10-CM | POA: Diagnosis not present

## 2017-01-08 DIAGNOSIS — I10 Essential (primary) hypertension: Secondary | ICD-10-CM | POA: Diagnosis not present

## 2017-01-09 DIAGNOSIS — Z950 Presence of cardiac pacemaker: Secondary | ICD-10-CM | POA: Diagnosis not present

## 2017-01-09 DIAGNOSIS — R1013 Epigastric pain: Secondary | ICD-10-CM | POA: Diagnosis not present

## 2017-01-09 DIAGNOSIS — I10 Essential (primary) hypertension: Secondary | ICD-10-CM | POA: Diagnosis not present

## 2017-01-09 DIAGNOSIS — N179 Acute kidney failure, unspecified: Secondary | ICD-10-CM | POA: Diagnosis not present

## 2017-01-09 DIAGNOSIS — K861 Other chronic pancreatitis: Secondary | ICD-10-CM | POA: Diagnosis not present

## 2017-01-10 DIAGNOSIS — K861 Other chronic pancreatitis: Secondary | ICD-10-CM | POA: Diagnosis not present

## 2017-01-10 DIAGNOSIS — I10 Essential (primary) hypertension: Secondary | ICD-10-CM | POA: Diagnosis not present

## 2017-01-10 DIAGNOSIS — N179 Acute kidney failure, unspecified: Secondary | ICD-10-CM | POA: Diagnosis not present

## 2017-01-10 DIAGNOSIS — R1013 Epigastric pain: Secondary | ICD-10-CM | POA: Diagnosis not present

## 2017-01-10 DIAGNOSIS — R109 Unspecified abdominal pain: Secondary | ICD-10-CM | POA: Diagnosis not present

## 2017-01-10 DIAGNOSIS — Z950 Presence of cardiac pacemaker: Secondary | ICD-10-CM | POA: Diagnosis not present

## 2017-01-27 ENCOUNTER — Emergency Department: Payer: Medicare Other

## 2017-01-27 ENCOUNTER — Emergency Department
Admission: EM | Admit: 2017-01-27 | Discharge: 2017-01-27 | Disposition: A | Discharge status: Home / Self Care | Payer: Medicare Other | Attending: Student in an Organized Health Care Education/Training Program | Admitting: Student in an Organized Health Care Education/Training Program

## 2017-01-27 ENCOUNTER — Encounter: Payer: Self-pay | Admitting: Radiology

## 2017-01-27 DIAGNOSIS — R1084 Generalized abdominal pain: Secondary | ICD-10-CM | POA: Insufficient documentation

## 2017-01-27 DIAGNOSIS — Z950 Presence of cardiac pacemaker: Secondary | ICD-10-CM | POA: Insufficient documentation

## 2017-01-27 DIAGNOSIS — F17210 Nicotine dependence, cigarettes, uncomplicated: Secondary | ICD-10-CM | POA: Diagnosis not present

## 2017-01-27 DIAGNOSIS — I10 Essential (primary) hypertension: Secondary | ICD-10-CM | POA: Diagnosis not present

## 2017-01-27 DIAGNOSIS — Z79899 Other long term (current) drug therapy: Secondary | ICD-10-CM | POA: Diagnosis not present

## 2017-01-27 DIAGNOSIS — R1013 Epigastric pain: Secondary | ICD-10-CM | POA: Diagnosis present

## 2017-01-27 DIAGNOSIS — R079 Chest pain, unspecified: Secondary | ICD-10-CM | POA: Diagnosis not present

## 2017-01-27 DIAGNOSIS — N200 Calculus of kidney: Secondary | ICD-10-CM | POA: Diagnosis not present

## 2017-01-27 DIAGNOSIS — R1012 Left upper quadrant pain: Secondary | ICD-10-CM | POA: Diagnosis not present

## 2017-01-27 LAB — BASIC METABOLIC PANEL
ANION GAP: 5 (ref 5–15)
BUN: 16 mg/dL (ref 6–20)
CHLORIDE: 109 mmol/L (ref 101–111)
CO2: 27 mmol/L (ref 22–32)
CREATININE: 0.63 mg/dL (ref 0.44–1.00)
Calcium: 9 mg/dL (ref 8.9–10.3)
GFR calc non Af Amer: >60 mL/min (ref >60)
Glucose, Bld: 109 mg/dL — ABNORMAL HIGH (ref 65–99)
Potassium: 3.8 mmol/L (ref 3.5–5.1)
SODIUM: 141 mmol/L (ref 135–145)

## 2017-01-27 LAB — HEPATIC FUNCTION PANEL
ALT: 14 U/L (ref 14–54)
AST: 20 U/L (ref 15–41)
Albumin: 3.5 g/dL (ref 3.5–5.0)
Alkaline Phosphatase: 66 U/L (ref 38–126)
BILIRUBIN TOTAL: 0.4 mg/dL (ref 0.3–1.2)
Bilirubin, Direct: <0.1 mg/dL — ABNORMAL LOW (ref 0.1–0.5)
Total Protein: 6.2 g/dL — ABNORMAL LOW (ref 6.5–8.1)

## 2017-01-27 LAB — TROPONIN I: Troponin I: <0.03 ng/mL (ref <0.03)

## 2017-01-27 LAB — CBC
HCT: 40.5 % (ref 35.0–47.0)
HEMOGLOBIN: 13.7 g/dL (ref 12.0–16.0)
MCH: 30.3 pg (ref 26.0–34.0)
MCHC: 33.8 g/dL (ref 32.0–36.0)
MCV: 89.7 fL (ref 80.0–100.0)
PLATELETS: 197 10*3/uL (ref 150–440)
RBC: 4.52 MIL/uL (ref 3.80–5.20)
RDW: 12.9 % (ref 11.5–14.5)
WBC: 7.9 10*3/uL (ref 3.6–11.0)

## 2017-01-27 LAB — URINALYSIS, COMPLETE (UACMP) WITH MICROSCOPIC
BACTERIA UA: NONE SEEN
BILIRUBIN URINE: NEGATIVE
Glucose, UA: NEGATIVE mg/dL
HGB URINE DIPSTICK: NEGATIVE
KETONES UR: NEGATIVE mg/dL
NITRITE: NEGATIVE
PROTEIN: NEGATIVE mg/dL
SPECIFIC GRAVITY, URINE: 1.008 (ref 1.005–1.030)
pH: 7 (ref 5.0–8.0)

## 2017-01-27 LAB — LIPASE, BLOOD: Lipase: 41 U/L (ref 11–51)

## 2017-01-27 MED ORDER — IBUPROFEN 600 MG PO TABS
ORAL_TABLET | ORAL | Status: AC
  Filled 2017-01-27: qty 1

## 2017-01-27 MED ORDER — SODIUM CHLORIDE 0.9 % IV BOLUS (SEPSIS)
1000.0000 mL | Freq: Once | INTRAVENOUS | Status: AC
Start: 2017-01-27 — End: 2017-01-27
  Administered 2017-01-27: 1000 mL via INTRAVENOUS

## 2017-01-27 MED ORDER — ONDANSETRON HCL 4 MG/2ML IJ SOLN
4.0000 mg | Freq: Once | INTRAMUSCULAR | Status: AC
  Administered 2017-01-27: 4 mg via INTRAVENOUS
  Filled 2017-01-27: qty 2

## 2017-01-27 MED ORDER — TRAMADOL HCL 50 MG PO TABS: 50.0000 mg | ORAL_TABLET | Freq: Four times a day (QID) | ORAL | 0 refills | Status: DC | PRN

## 2017-01-27 MED ORDER — HYDROCODONE-ACETAMINOPHEN 5-325 MG PO TABS
1.0000 | ORAL_TABLET | Freq: Once | ORAL | Status: AC
  Administered 2017-01-27: 1 via ORAL
  Filled 2017-01-27: qty 1

## 2017-01-27 MED ORDER — PROMETHAZINE HCL 12.5 MG PO TABS: 12.5000 mg | ORAL_TABLET | Freq: Four times a day (QID) | ORAL | 0 refills | Status: DC | PRN

## 2017-01-27 MED ORDER — MORPHINE SULFATE (PF) 4 MG/ML IV SOLN: 4.0000 mg | INTRAVENOUS | Status: DC | PRN

## 2017-01-27 MED ORDER — FENTANYL CITRATE (PF) 100 MCG/2ML IJ SOLN
50.0000 ug | INTRAMUSCULAR | Status: DC | PRN
  Administered 2017-01-27: 50 ug via INTRAVENOUS
  Filled 2017-01-27: qty 2

## 2017-01-27 MED ORDER — IOPAMIDOL (ISOVUE-300) INJECTION 61%
100.0000 mL | Freq: Once | INTRAVENOUS | Status: AC | PRN
  Administered 2017-01-27: 100 mL via INTRAVENOUS

## 2017-01-27 NOTE — ED Provider Notes (Signed)
[facility] Emergency Department Provider Note    First MD Initiated Contact with Patient 01/27/17 1720     (approximate)  I have reviewed the triage vital signs and the nursing notes.   HISTORY  Chief Complaint Chest Pain and Abdominal Pain    HPI Lori Orr is a 66 y.o. female presents with 3 days of diffuse epigastric pain radiating through to her back associated with nausea and nonbloody nonbilious emesis. Also has had some shortness of breath and chest pain with this after the vomiting. Patient states she has a history of chronic pancreatitis and this feels similar to this. Has not had any relief of the pain for the past 3 days since she came to the ER. Since that she just recently moved back to the area from [place] to live with her daughter. Review of medical records patient was here in April for identical symptoms and then said that she was moving to the area to live with her daughter permanently.   Past Medical History:  Diagnosis Date  . Cancer ([place])   . Hypertension   . Pacemaker   . Pancreatitis    Family History  Problem Relation Age of Onset  . Dementia Mother   . Heart attack Father    Past Surgical History:  Procedure Laterality Date  . ABDOMINAL HYSTERECTOMY    . APPENDECTOMY    . CARDIAC SURGERY     Patient Active Problem List   Diagnosis Date Noted  . Cardiac pacemaker in situ 03/29/2016  . History of malignant neoplasm of cervix 03/29/2016  . Hypertensive disorder 03/29/2016  . Kidney stone 03/29/2016  . Multiple nodules of lung 03/29/2016      Prior to Admission medications   Medication Sig Start Date End Date Taking? Authorizing Provider  amLODipine (NORVASC) 10 MG tablet Take 10 mg by mouth daily.    [provider]  gabapentin (NEURONTIN) 300 MG capsule gabapentin 300 mg capsule    [provider]  HYDROcodone-acetaminophen (NORCO) 10-325 MG tablet hydrocodone 10 mg-acetaminophen 325 mg  tablet    [provider]  isosorbide dinitrate (ISORDIL) 10 MG tablet isosorbide dinitrate 10 mg tablet    [provider]  lisinopril (PRINIVIL,ZESTRIL) 40 MG tablet Take 40 mg by mouth daily.    [provider]  metoprolol (TOPROL-XL) 200 MG 24 hr tablet Take 200 mg by mouth daily.    [provider]  oxyCODONE-acetaminophen (PERCOCET) 5-325 MG tablet Take 2 tablets by mouth every 6 (six) hours as needed for moderate pain or severe pain. 10/07/16   Earleen Newport, MD  traMADol Veatrice Bourbon) 50 MG tablet take 1 tablet by mouth every 6 hours if needed for severe pain 09/14/16   [provider]    Allergies Sulfa antibiotics    Social History Social History  Substance Use Topics  . Smoking status: Current Every Day Smoker    Packs/day: 0.50    Types: Cigarettes  . Smokeless tobacco: Never Used  . Alcohol use No    Review of Systems Patient denies headaches, rhinorrhea, blurry vision, numbness, shortness of breath, chest pain, edema, cough, abdominal pain, nausea, vomiting, diarrhea, dysuria, fevers, rashes or hallucinations unless otherwise stated above in HPI. ____________________________________________   PHYSICAL EXAM:  VITAL SIGNS: Vitals:   01/27/17 1937 01/27/17 2105  BP: (!) 177/91 (!) 208/74  Pulse: (!) 58 60  Resp: 16 18  Temp:    SpO2: 98% 100%    Constitutional: Alert and oriented.  in no acute distress. Eyes: Conjunctivae are normal.  Head: Atraumatic. Nose: No congestion/rhinnorhea. Mouth/Throat: Mucous membranes are moist.   Neck: No stridor. Painless ROM.  Cardiovascular: Normal rate, regular rhythm. Grossly normal heart sounds.  Good peripheral circulation. Respiratory: Normal respiratory effort.  No retractions. Lungs CTAB. Gastrointestinal: Soft with mild ttp in LUQ. No distention. No abdominal bruits. No CVA tenderness. Genitourinary:  Musculoskeletal: No lower extremity tenderness nor edema.  No joint  effusions. Neurologic:  Normal speech and language. No gross focal neurologic deficits are appreciated. No facial droop Skin:  Skin is warm, dry and intact. No rash noted. Psychiatric: Mood and affect are normal. Speech and behavior are normal.  ____________________________________________   LABS (all labs ordered are listed, but only abnormal results are displayed)  Results for orders placed or performed during the hospital encounter of 01/27/17 (from the past 24 hour(s))  Basic metabolic panel     Status: Abnormal   Collection Time: 01/27/17  7:29 PM  Result Value Ref Range   Sodium 141 135 - 145 mmol/L   Potassium 3.8 3.5 - 5.1 mmol/L   Chloride 109 101 - 111 mmol/L   CO2 27 22 - 32 mmol/L   Glucose, Bld 109 (H) 65 - 99 mg/dL   BUN 16 6 - 20 mg/dL   Creatinine, Ser 0.63 0.44 - 1.00 mg/dL   Calcium 9.0 8.9 - 10.3 mg/dL   GFR calc non Af Amer >60 >60 mL/min   GFR calc Af Amer >60 >60 mL/min   Anion gap 5 5 - 15  CBC     Status: None   Collection Time: 01/27/17  7:29 PM  Result Value Ref Range   WBC 7.9 3.6 - 11.0 K/uL   RBC 4.52 3.80 - 5.20 MIL/uL   Hemoglobin 13.7 12.0 - 16.0 g/dL   HCT 40.5 35.0 - 47.0 %   MCV 89.7 80.0 - 100.0 fL   MCH 30.3 26.0 - 34.0 pg   MCHC 33.8 32.0 - 36.0 g/dL   RDW 12.9 11.5 - 14.5 %   Platelets 197 150 - 440 K/uL  Troponin I     Status: None   Collection Time: 01/27/17  7:29 PM  Result Value Ref Range   Troponin I <0.03 <0.03 ng/mL  Hepatic function panel     Status: Abnormal   Collection Time: 01/27/17  7:29 PM  Result Value Ref Range   Total Protein 6.2 (L) 6.5 - 8.1 g/dL   Albumin 3.5 3.5 - 5.0 g/dL   AST 20 15 - 41 U/L   ALT 14 14 - 54 U/L   Alkaline Phosphatase 66 38 - 126 U/L   Total Bilirubin 0.4 0.3 - 1.2 mg/dL   Bilirubin, Direct <0.1 (L) 0.1 - 0.5 mg/dL   Indirect Bilirubin NOT CALCULATED 0.3 - 0.9 mg/dL  Lipase, blood     Status: None   Collection Time: 01/27/17  7:29 PM  Result Value Ref Range   Lipase 41 11 - 51 U/L    Urinalysis, Complete w Microscopic     Status: Abnormal   Collection Time: 01/27/17  8:23 PM  Result Value Ref Range   Color, Urine STRAW (A) YELLOW   APPearance CLEAR (A) CLEAR   Specific Gravity, Urine 1.008 1.005 - 1.030   pH 7.0 5.0 - 8.0   Glucose, UA NEGATIVE NEGATIVE mg/dL   Hgb urine dipstick NEGATIVE NEGATIVE   Bilirubin Urine NEGATIVE NEGATIVE   Ketones, ur NEGATIVE NEGATIVE mg/dL   Protein, ur  NEGATIVE NEGATIVE mg/dL   Nitrite NEGATIVE NEGATIVE   Leukocytes, UA SMALL (A) NEGATIVE   RBC / HPF 0-5 0 - 5 RBC/hpf   WBC, UA 0-5 0 - 5 WBC/hpf   Bacteria, UA NONE SEEN NONE SEEN   Squamous Epithelial / LPF 0-5 (A) NONE SEEN   ____________________________________________  EKG My review and personal interpretation at Time: 17:26   Indication: chest pain  Rate: 60  Rhythm: a paced Axis: normal Other: normal intervals, no stemi ____________________________________________  RADIOLOGY  I personally reviewed all radiographic images ordered to evaluate for the above acute complaints and reviewed radiology reports and findings.  These findings were personally discussed with the patient.  Please see medical record for radiology report.  ____________________________________________   PROCEDURES  Procedure(s) performed:  Procedures    Critical Care performed: no ____________________________________________   INITIAL IMPRESSION / ASSESSMENT AND PLAN / ED COURSE  Pertinent labs & imaging results that were available during my care of the patient were reviewed by me and considered in my medical decision making (see chart for details).  DDX: pancreatitis, sbo, enteritis, gastritis, unlikely acs, pna, ptx   Lori Orr is a 66 y.o. who presents to the ED with chest and abdominal pain as described above. Patient afebrile and hemodynamic stable. Extensive workup ordered based on patient's complaints and age and risk factors. Blood work is reassuring. CT imaging shows no  evidence of acute intra-abdominal process. Discussed results of CT with patient. She is not having any evidence of ACS. She tolerating oral hydration. Workup at this point is very reassuring. I do feel patient is stable for follow-up with PCP.      ____________________________________________   FINAL CLINICAL IMPRESSION(S) / ED DIAGNOSES  Final diagnoses:  Generalized abdominal pain      NEW MEDICATIONS STARTED DURING THIS VISIT:  New Prescriptions   No medications on file     Note:  This document was prepared using Dragon voice recognition software and may include unintentional dictation errors.    Merlyn Lot, MD 01/27/17 2350

## 2017-01-27 NOTE — ED Triage Notes (Signed)
PT to [facility] reporting 3 days of LUQ pain with hx of pancreatitis. PT reports NVD with increased pain when eating and upon palpation. Pt reports she called EMS because today she started having left sided CP with SOB, lightheadedness and diaphoresis. PT denies having a cough and is no longer SOB upon arrival to ED. No fever at this time.

## 2017-01-27 NOTE — Discharge Instructions (Signed)
You have been seen in the emergency department for emergency care. It is important that you contact your own doctor, specialist or the closest clinic for follow-up care. Please bring this instruction sheet, all medications and X-ray copies with you when you are seen for follow-up care.  Determining the exact cause for all patients with abdominal pain is extremely difficult in the emergency department. Our primary focus is to rule-out immediate life-threatening diseases. If no immediate source of pain is found the definitive diagnosis frequently needs to be determined over time.Many times your primary care physician can determine the cause by following the symptoms over time. Sometimes, specialist are required such as Gastroenterologists, Gynecologists, Urologists or Surgeons. Please return immediately to the Emergency Department for fever>101, Vomiting or Intractable Pain. You should return to the emergency department or see your primary care provider in 12-24hrs if your pain is no better and sooner if your pain becomes worse.      IMPRESSION:  1. No CT evidence for acute intra-abdominal or pelvic abnormality.  2. 18 mm right adrenal gland mass, possible adenoma but suggest  nonemergent adrenal CT for further evaluation  3. Nonobstructing stone lower pole left kidney.  4. Multiple hypodense lesions in the left kidney most of which are  consistent with cysts. Midpole lesion is indeterminate and MRI,  nonemergent, could be obtained for further evaluation  5. Small right base lung nodules. No follow-up needed if patient is  low-risk (and has no known or suspected primary neoplasm).  Non-contrast chest CT can be considered in 12 months if patient is  high-risk. This recommendation follows the consensus statement:  Guidelines for Management of Incidental Pulmonary Nodules Detected  on CT Images: From the Fleischner Society 2017; Radiology 2017;  284:228-243.  6. Nonspecific 3 mm hypodense focus in  the spleen

## 2017-01-27 NOTE — ED Notes (Signed)
RN called lab to get an update on blood work and was told no blood could be found but they would keep looking. RN sent second round of blood to lab for testing and updated pt and doctor.

## 2017-01-27 NOTE — ED Notes (Signed)
IV charted in error 

## 2017-02-25 ENCOUNTER — Encounter: Payer: Self-pay | Admitting: Medical Oncology

## 2017-02-25 ENCOUNTER — Emergency Department: Payer: Medicare Other

## 2017-02-25 ENCOUNTER — Observation Stay
Admission: EM | Admit: 2017-02-25 | Discharge: 2017-02-26 | Disposition: A | Discharge status: Home / Self Care | Payer: Medicare Other | Attending: Internal Medicine | Admitting: Internal Medicine

## 2017-02-25 DIAGNOSIS — I110 Hypertensive heart disease with heart failure: Secondary | ICD-10-CM | POA: Diagnosis not present

## 2017-02-25 DIAGNOSIS — Z8541 Personal history of malignant neoplasm of cervix uteri: Secondary | ICD-10-CM | POA: Insufficient documentation

## 2017-02-25 DIAGNOSIS — R911 Solitary pulmonary nodule: Secondary | ICD-10-CM | POA: Diagnosis not present

## 2017-02-25 DIAGNOSIS — Z950 Presence of cardiac pacemaker: Secondary | ICD-10-CM | POA: Insufficient documentation

## 2017-02-25 DIAGNOSIS — R778 Other specified abnormalities of plasma proteins: Secondary | ICD-10-CM

## 2017-02-25 DIAGNOSIS — Z79899 Other long term (current) drug therapy: Secondary | ICD-10-CM | POA: Diagnosis not present

## 2017-02-25 DIAGNOSIS — I700 Atherosclerosis of aorta: Secondary | ICD-10-CM | POA: Diagnosis not present

## 2017-02-25 DIAGNOSIS — R112 Nausea with vomiting, unspecified: Secondary | ICD-10-CM | POA: Diagnosis not present

## 2017-02-25 DIAGNOSIS — R1013 Epigastric pain: Principal | ICD-10-CM | POA: Insufficient documentation

## 2017-02-25 DIAGNOSIS — Z90710 Acquired absence of both cervix and uterus: Secondary | ICD-10-CM | POA: Diagnosis not present

## 2017-02-25 DIAGNOSIS — R7989 Other specified abnormal findings of blood chemistry: Secondary | ICD-10-CM | POA: Diagnosis not present

## 2017-02-25 DIAGNOSIS — R1032 Left lower quadrant pain: Secondary | ICD-10-CM | POA: Diagnosis not present

## 2017-02-25 DIAGNOSIS — R748 Abnormal levels of other serum enzymes: Secondary | ICD-10-CM | POA: Diagnosis not present

## 2017-02-25 DIAGNOSIS — Z882 Allergy status to sulfonamides status: Secondary | ICD-10-CM | POA: Diagnosis not present

## 2017-02-25 DIAGNOSIS — F17210 Nicotine dependence, cigarettes, uncomplicated: Secondary | ICD-10-CM | POA: Diagnosis not present

## 2017-02-25 DIAGNOSIS — R0789 Other chest pain: Secondary | ICD-10-CM | POA: Insufficient documentation

## 2017-02-25 DIAGNOSIS — N289 Disorder of kidney and ureter, unspecified: Secondary | ICD-10-CM | POA: Insufficient documentation

## 2017-02-25 DIAGNOSIS — I10 Essential (primary) hypertension: Secondary | ICD-10-CM | POA: Diagnosis not present

## 2017-02-25 DIAGNOSIS — J449 Chronic obstructive pulmonary disease, unspecified: Secondary | ICD-10-CM | POA: Insufficient documentation

## 2017-02-25 DIAGNOSIS — R9431 Abnormal electrocardiogram [ECG] [EKG]: Secondary | ICD-10-CM | POA: Diagnosis not present

## 2017-02-25 DIAGNOSIS — R279 Unspecified lack of coordination: Secondary | ICD-10-CM | POA: Diagnosis not present

## 2017-02-25 DIAGNOSIS — I5030 Unspecified diastolic (congestive) heart failure: Secondary | ICD-10-CM | POA: Insufficient documentation

## 2017-02-25 DIAGNOSIS — K861 Other chronic pancreatitis: Secondary | ICD-10-CM | POA: Insufficient documentation

## 2017-02-25 HISTORY — DX: Bradycardia, unspecified: R00.1

## 2017-02-25 HISTORY — DX: Heart failure, unspecified: I50.9

## 2017-02-25 LAB — COMPREHENSIVE METABOLIC PANEL
ALK PHOS: 71 U/L (ref 38–126)
ALT: 22 U/L (ref 14–54)
ANION GAP: 6 (ref 5–15)
AST: 24 U/L (ref 15–41)
Albumin: 4.1 g/dL (ref 3.5–5.0)
BILIRUBIN TOTAL: 0.7 mg/dL (ref 0.3–1.2)
BUN: 23 mg/dL — ABNORMAL HIGH (ref 6–20)
CALCIUM: 9.5 mg/dL (ref 8.9–10.3)
CO2: 28 mmol/L (ref 22–32)
Chloride: 107 mmol/L (ref 101–111)
Creatinine, Ser: 0.79 mg/dL (ref 0.44–1.00)
GLUCOSE: 102 mg/dL — AB (ref 65–99)
Potassium: 4.3 mmol/L (ref 3.5–5.1)
Sodium: 141 mmol/L (ref 135–145)
TOTAL PROTEIN: 7.5 g/dL (ref 6.5–8.1)

## 2017-02-25 LAB — URINALYSIS, COMPLETE (UACMP) WITH MICROSCOPIC
Bilirubin Urine: NEGATIVE
Glucose, UA: NEGATIVE mg/dL
Hgb urine dipstick: NEGATIVE
Ketones, ur: NEGATIVE mg/dL
Nitrite: NEGATIVE
PH: 7 (ref 5.0–8.0)
Protein, ur: NEGATIVE mg/dL
SPECIFIC GRAVITY, URINE: 1.004 — AB (ref 1.005–1.030)

## 2017-02-25 LAB — CBC
HCT: 48.6 % — ABNORMAL HIGH (ref 35.0–47.0)
HEMOGLOBIN: 16.2 g/dL — AB (ref 12.0–16.0)
MCH: 29.9 pg (ref 26.0–34.0)
MCHC: 33.4 g/dL (ref 32.0–36.0)
MCV: 89.4 fL (ref 80.0–100.0)
Platelets: 238 10*3/uL (ref 150–440)
RBC: 5.44 MIL/uL — AB (ref 3.80–5.20)
RDW: 12.9 % (ref 11.5–14.5)
WBC: 8.1 10*3/uL (ref 3.6–11.0)

## 2017-02-25 LAB — TROPONIN I
TROPONIN I: 0.04 ng/mL — AB (ref <0.03)
Troponin I: <0.03 ng/mL (ref <0.03)

## 2017-02-25 LAB — LIPASE, BLOOD: Lipase: 33 U/L (ref 11–51)

## 2017-02-25 MED ORDER — SODIUM CHLORIDE 0.9 % IV SOLN
INTRAVENOUS | Status: DC
  Administered 2017-02-25: 1000 mL via INTRAVENOUS
  Administered 2017-02-26: 01:00:00 via INTRAVENOUS

## 2017-02-25 MED ORDER — MORPHINE SULFATE (PF) 2 MG/ML IV SOLN
2.0000 mg | Freq: Once | INTRAVENOUS | Status: AC
  Administered 2017-02-25: 2 mg via INTRAVENOUS
  Filled 2017-02-25: qty 1

## 2017-02-25 MED ORDER — ACETAMINOPHEN 325 MG PO TABS: 650.0000 mg | ORAL_TABLET | Freq: Four times a day (QID) | ORAL | Status: DC | PRN

## 2017-02-25 MED ORDER — ACETAMINOPHEN 650 MG RE SUPP: 650.0000 mg | Freq: Four times a day (QID) | RECTAL | Status: DC | PRN

## 2017-02-25 MED ORDER — ONDANSETRON HCL 4 MG PO TABS: 4.0000 mg | ORAL_TABLET | Freq: Four times a day (QID) | ORAL | Status: DC | PRN

## 2017-02-25 MED ORDER — IOPAMIDOL (ISOVUE-300) INJECTION 61%
75.0000 mL | Freq: Once | INTRAVENOUS | Status: AC | PRN
  Administered 2017-02-25: 75 mL via INTRAVENOUS

## 2017-02-25 MED ORDER — LISINOPRIL 20 MG PO TABS
40.0000 mg | ORAL_TABLET | Freq: Every day | ORAL | Status: DC
  Administered 2017-02-25 – 2017-02-26 (×2): 40 mg via ORAL
  Filled 2017-02-25 (×2): qty 2

## 2017-02-25 MED ORDER — ONDANSETRON HCL 4 MG/2ML IJ SOLN
4.0000 mg | Freq: Once | INTRAMUSCULAR | Status: AC
  Administered 2017-02-25: 4 mg via INTRAVENOUS
  Filled 2017-02-25: qty 2

## 2017-02-25 MED ORDER — HEPARIN SODIUM (PORCINE) 5000 UNIT/ML IJ SOLN
5000.0000 [IU] | Freq: Three times a day (TID) | INTRAMUSCULAR | Status: DC
  Administered 2017-02-25 – 2017-02-26 (×2): 5000 [IU] via SUBCUTANEOUS
  Filled 2017-02-25 (×2): qty 1

## 2017-02-25 MED ORDER — IOPAMIDOL (ISOVUE-300) INJECTION 61%
15.0000 mL | INTRAVENOUS | Status: AC
  Administered 2017-02-25: 30 mL via ORAL

## 2017-02-25 MED ORDER — SODIUM CHLORIDE 0.9 % IV BOLUS (SEPSIS)
500.0000 mL | Freq: Once | INTRAVENOUS | Status: AC
  Administered 2017-02-25: 500 mL via INTRAVENOUS

## 2017-02-25 MED ORDER — ASPIRIN EC 81 MG PO TBEC
81.0000 mg | DELAYED_RELEASE_TABLET | Freq: Every day | ORAL | Status: DC
  Administered 2017-02-26: 81 mg via ORAL
  Filled 2017-02-25: qty 1

## 2017-02-25 MED ORDER — PANTOPRAZOLE SODIUM 40 MG PO TBEC
40.0000 mg | DELAYED_RELEASE_TABLET | Freq: Two times a day (BID) | ORAL | Status: DC
  Administered 2017-02-25 – 2017-02-26 (×2): 40 mg via ORAL
  Filled 2017-02-25 (×2): qty 1

## 2017-02-25 MED ORDER — ASPIRIN 81 MG PO CHEW
324.0000 mg | CHEWABLE_TABLET | Freq: Once | ORAL | Status: AC
  Administered 2017-02-25: 324 mg via ORAL
  Filled 2017-02-25: qty 4

## 2017-02-25 MED ORDER — MORPHINE SULFATE (PF) 2 MG/ML IV SOLN
2.0000 mg | INTRAVENOUS | Status: DC | PRN
  Administered 2017-02-25 – 2017-02-26 (×5): 2 mg via INTRAVENOUS
  Filled 2017-02-25 (×5): qty 1

## 2017-02-25 MED ORDER — DOCUSATE SODIUM 100 MG PO CAPS
100.0000 mg | ORAL_CAPSULE | Freq: Two times a day (BID) | ORAL | Status: DC
  Administered 2017-02-25 – 2017-02-26 (×2): 100 mg via ORAL
  Filled 2017-02-25 (×2): qty 1

## 2017-02-25 MED ORDER — BISACODYL 10 MG RE SUPP
10.0000 mg | Freq: Every day | RECTAL | Status: DC | PRN
Start: 2017-02-25 — End: 2017-02-26

## 2017-02-25 MED ORDER — NITROGLYCERIN 0.4 MG SL SUBL: 0.4000 mg | SUBLINGUAL_TABLET | SUBLINGUAL | Status: DC | PRN

## 2017-02-25 MED ORDER — CYCLOBENZAPRINE HCL 10 MG PO TABS: 5.0000 mg | ORAL_TABLET | Freq: Three times a day (TID) | ORAL | Status: DC | PRN

## 2017-02-25 MED ORDER — METOPROLOL SUCCINATE ER 100 MG PO TB24
200.0000 mg | ORAL_TABLET | Freq: Every day | ORAL | Status: DC
  Administered 2017-02-25 – 2017-02-26 (×2): 200 mg via ORAL
  Filled 2017-02-25 (×2): qty 2

## 2017-02-25 MED ORDER — MORPHINE SULFATE (PF) 4 MG/ML IV SOLN
4.0000 mg | Freq: Once | INTRAVENOUS | Status: AC
  Administered 2017-02-25: 4 mg via INTRAVENOUS
  Filled 2017-02-25: qty 1

## 2017-02-25 MED ORDER — AMLODIPINE BESYLATE 10 MG PO TABS
10.0000 mg | ORAL_TABLET | Freq: Every day | ORAL | Status: DC
  Administered 2017-02-25 – 2017-02-26 (×2): 10 mg via ORAL
  Filled 2017-02-25 (×2): qty 1

## 2017-02-25 MED ORDER — NITROGLYCERIN 2 % TD OINT
1.0000 [in_us] | TOPICAL_OINTMENT | Freq: Four times a day (QID) | TRANSDERMAL | Status: DC
  Filled 2017-02-25: qty 1

## 2017-02-25 MED ORDER — ONDANSETRON HCL 4 MG/2ML IJ SOLN
4.0000 mg | Freq: Four times a day (QID) | INTRAMUSCULAR | Status: DC | PRN
  Administered 2017-02-25: 4 mg via INTRAVENOUS
  Filled 2017-02-25: qty 2

## 2017-02-25 NOTE — H&P (Signed)
History and Physical    Lori Orr BPZ:[id number] DOB: February 26, 1951 DOA: 02/25/2017  Referring physician: Dr. Jacqualine Code PCP: Patient, No Pcp Per  Specialists: none  Chief Complaint: abdominal pain with N/V  HPI: Lori Orr is a 66 y.o. female has a past medical history significant for CHB s/p pacer, HTN, COPD, and chronic pancreatitis who presents to ER with worsening of her chronic abdominal pain associated with N/V. While in the ER, pt also c/o "heart pains". Lipase and abdominal CT stable. BP elevated. Troponin mildly elevated and EKG with inferior and lateral T-wave inversion. She is now admitted. Currently without CP.  Review of Systems: The patient denies anorexia, fever, weight loss,, vision loss, decreased hearing, hoarseness, syncope, dyspnea on exertion, peripheral edema, balance deficits, hemoptysis, melena, hematochezia, severe indigestion/heartburn, hematuria, incontinence, genital sores, muscle weakness, suspicious skin lesions, transient blindness, difficulty walking, depression, unusual weight change, abnormal bleeding, enlarged lymph nodes, angioedema, and breast masses.   Past Medical History:  Diagnosis Date  . Cancer ([place])   . CHF (congestive heart failure) ([place])   . Hypertension   . Pacemaker   . Pancreatitis    Past Surgical History:  Procedure Laterality Date  . ABDOMINAL HYSTERECTOMY    . APPENDECTOMY    . CARDIAC SURGERY     Social History:  reports that she has been smoking Cigarettes.  She has been smoking about 0.50 packs per day. She has never used smokeless tobacco. She reports that she does not drink alcohol or use drugs.  Allergies  Allergen Reactions  . Sulfa Antibiotics Rash    Family History  Problem Relation Age of Onset  . Dementia Mother   . Heart attack Father     Prior to Admission medications   Medication Sig Start Date End Date Taking? Authorizing Provider  cyclobenzaprine (FLEXERIL) 5 MG tablet Take 1 tablet by mouth daily.  01/29/16  Yes [provider]  ibuprofen (ADVIL,MOTRIN) 600 MG tablet Take 1 tablet by mouth 3 (three) times daily as needed. 01/29/16  Yes [provider]  amLODipine (NORVASC) 10 MG tablet Take 10 mg by mouth daily.    [provider]  lisinopril (PRINIVIL,ZESTRIL) 40 MG tablet Take 40 mg by mouth daily.    [provider]  metoprolol (TOPROL-XL) 200 MG 24 hr tablet Take 200 mg by mouth daily.    [provider]  oxyCODONE-acetaminophen (PERCOCET) 5-325 MG tablet Take 2 tablets by mouth every 6 (six) hours as needed for moderate pain or severe pain. Patient not taking: Reported on 01/27/2017 10/07/16   Earleen Newport, MD  promethazine (PHENERGAN) 12.5 MG tablet Take 1 tablet (12.5 mg total) by mouth every 6 (six) hours as needed for nausea or vomiting. 01/27/17   Merlyn Lot, MD  traMADol (ULTRAM) 50 MG tablet Take 1 tablet (50 mg total) by mouth every 6 (six) hours as needed. 01/27/17 01/27/18  Merlyn Lot, MD   Physical Exam: Vitals:   02/25/17 1133 02/25/17 1200 02/25/17 1300 02/25/17 1500  BP: (!) 181/108 (!) 186/109 (!) 157/95 (!) 159/88  Pulse: 60 60 (!) 59 (!) 59  Resp: 18  15 16   Temp: 98.3 F (36.8 C)     TempSrc: Oral     SpO2: 99% 97% 95% 96%  Weight:      Height:         General:  No apparent distress, WDWN, [place]/AT  Eyes: PERRL, EOMI, no scleral icterus, conjunctiva clear  ENT: moist oropharynx without exudate, TM's benign, dentition  fair  Neck: supple, no lymphadenopathy.No bruits or thyromegaly  Cardiovascular: regular rate without MRG; 2+ peripheral pulses, no JVD, no peripheral edema  Respiratory: CTA biL, good air movement without wheezing, rhonchi or crackled. Respiratory effort normal  Abdomen: soft, non tender to palpation, positive bowel sounds, no guarding, no rebound  Skin: no rashes or lesions  Musculoskeletal: normal bulk and tone, no joint swelling  Psychiatric: normal mood and affect,  A&OX3  Neurologic: CN 2-12 grossly intact, Motor strength 5/5 in all 4 groups with symmetric DTR's and non-focal sensory exam  Labs on Admission:  Basic Metabolic Panel:  Recent Labs Lab 02/25/17 1140  NA 141  K 4.3  CL 107  CO2 28  GLUCOSE 102*  BUN 23*  CREATININE 0.79  CALCIUM 9.5   Liver Function Tests:  Recent Labs Lab 02/25/17 1140  AST 24  ALT 22  ALKPHOS 71  BILITOT 0.7  PROT 7.5  ALBUMIN 4.1    Recent Labs Lab 02/25/17 1140  LIPASE 33   No results for input(s): AMMONIA in the last 168 hours. CBC:  Recent Labs Lab 02/25/17 1140  WBC 8.1  HGB 16.2*  HCT 48.6*  MCV 89.4  PLT 238   Cardiac Enzymes:  Recent Labs Lab 02/25/17 1140  TROPONINI 0.04*    BNP (last 3 results) No results for input(s): BNP in the last 8760 hours.  ProBNP (last 3 results) No results for input(s): PROBNP in the last 8760 hours.  CBG: No results for input(s): GLUCAP in the last 168 hours.  Radiological Exams on Admission: Ct Abdomen Pelvis W Contrast  Result Date: 02/25/2017 CLINICAL DATA:  Left lower quadrant pain. Patient states she has pancreatitis. EXAM: CT ABDOMEN AND PELVIS WITH CONTRAST TECHNIQUE: Multidetector CT imaging of the abdomen and pelvis was performed using the standard protocol following bolus administration of intravenous contrast. CONTRAST:  6mL ISOVUE-300 IOPAMIDOL (ISOVUE-300) INJECTION 61% COMPARISON:  January 27, 2017 FINDINGS: Lower chest: A nodule abutting the right hemidiaphragm on series 4, image 9 measures 5 mm, unchanged. Possible nodule in the left lung base measuring 4 mm, not included on the previous study. No acute abnormalities identified in the lung bases. Cardiomegaly is noted. Hepatobiliary: Hepatic steatosis. The liver, gallbladder, and portal vein are otherwise normal. Pancreas: Dilatation of the pancreatic duct, primarily within the head, neck, and proximal body of the pancreas is more pronounced in the interval measuring up to 6  mm. There is a stone within or adjacent to the pancreatic duct of the tail on coronal image 25 which is stable. There is a calcification in the head of the pancreas on coronal image 31 either within or adjacent to the distal pancreatic duct. Spleen: Normal in size without focal abnormality. Adrenals/Urinary Tract: The nodule in the right adrenal gland measures 18 mm on both studies with an attenuation of 45 Hounsfield units. The left adrenal gland is normal. A cortical calcification is seen in the left kidney. Vascular calcifications are associated with the left greater than right kidney. There is a stone in the lower pole left kidney. A tiny probable cyst is seen in the right kidney. Multiple cysts are seen on the left. There is a lesion on series 2, image 31 which is stable. It is indeterminate however with an attenuation of greater than 20 Hounsfield units. Both kidneys demonstrate an extrarenal pelvis with no acute hydronephrosis or perinephric stranding. The ureters are normal in appearance. The bladder is unremarkable. Stomach/Bowel: The stomach and small bowel are normal. Scattered  colonic diverticula are identified without diverticulitis. Patient is status post appendectomy. Vascular/Lymphatic: Atherosclerotic changes are seen in the nonaneurysmal aorta, iliac vessels common femoral vessels. No adenopathy. Reproductive: Status post hysterectomy. No adnexal masses. Other: No free air or free fluid. Musculoskeletal: Degenerative changes are seen in the lumbar spine. IMPRESSION: 1. No evidence of acute pancreatitis. The pancreatic duct is dilated however extending from the distal body into the head. This dilatation has worsened in the interval. There are 2 tiny calcifications either in or immediately adjacent to the pancreatic duct which are stable. No definitive cause for the increasing caliber of the pancreatic duct in the interval. 2. Stable 5 mm nodule in the right lung base. No follow-up needed if patient  is low-risk. Non-contrast chest CT can be considered in 12 months if patient is high-risk. This recommendation follows the consensus statement: Guidelines for Management of Incidental Pulmonary Nodules Detected on CT Images: From the Fleischner Society 2017; Radiology 2017; 284:228-243. 3. Indeterminate left renal lesion described on previous CT scan is stable. Recommend MRI for further characterization as previously recommended. 4. Stable right adrenal nodule. Recommend attention to this nodule on the MRI to assess the renal lesion. Aortic Atherosclerosis (ICD10-I70.0). Electronically Signed   By: Dorise Bullion III M.D   On: 02/25/2017 14:47    EKG: Independently reviewed.  Assessment/Plan Active Problems:   Hypertensive disorder   Chronic pancreatitis (HCC)   Atypical chest pain   Abnormal EKG   Will observe on floor with telemetry and follow enzymes. Clear liquid diet, advance as tolerated. Consult Cardiology and GI. Plan D/C home if enzymes negative for outpatient w/u  Diet: clear liquids Fluids: NS@100  DVT Prophylaxis: SQ Heparin  Code Status: FULL  Family Communication: none  Disposition Plan: home  Time spent: 50 min

## 2017-02-25 NOTE — ED Notes (Signed)
Patient transported to CT 

## 2017-02-25 NOTE — ED Notes (Signed)
Pt provided turkey sandwich tray upon request. 

## 2017-02-25 NOTE — ED Triage Notes (Signed)
Pt reports that she began having abd pain with nausea and vomiting yesterday. Hx of pancreatitis and feels similar.

## 2017-02-25 NOTE — ED Notes (Signed)
Cardiologist to bedside at this time.

## 2017-02-25 NOTE — ED Notes (Signed)
Pt ambulatory to toilet without difficulty. 

## 2017-02-25 NOTE — ED Provider Notes (Signed)
[facility] Emergency Department Provider Note  ____________________________________________   First MD Initiated Contact with Patient 02/25/17 1151     (approximate)  I have reviewed the triage vital signs and the nursing notes.   HISTORY  Chief Complaint Abdominal Pain and Emesis    HPI Lori Orr is a 66 y.o. female here for evaluation of upper abdominal pain  Patient reports she has a history of chronic pancreatitis. She was seen and treated in [place] previous, since then she's had flareups from time to time. Reports same symptoms today. Reports on Thursday she began expressing pain in her upper abdomen. She associates nausea and occasional vomiting of yellow acid with it. Last vomited this morning. Continues to move her bowels normally and pass gas. Reports she does not know what caused her pancreatitis in the past, and gastroenterologist in [place] do not know either. She does not drink alcohol.  Reports moderate nausea, ongoing moderate to severe sharp pain in the upper abdomen. Points to her epigastrium. Denies chest pain or trouble breathing   Past Medical History:  Diagnosis Date  . Bradycardia   . Cancer ([place])   . CHF (congestive heart failure) ([place])   . Hypertension   . Pancreatitis     Patient Active Problem List   Diagnosis Date Noted  . Chronic pancreatitis ([place]) 02/25/2017  . Atypical chest pain 02/25/2017  . Abnormal EKG 02/25/2017  . Chest pain 02/25/2017  . Cardiac pacemaker in situ 03/29/2016  . History of malignant neoplasm of cervix 03/29/2016  . Hypertensive disorder 03/29/2016  . Kidney stone 03/29/2016  . Multiple nodules of lung 03/29/2016    Past Surgical History:  Procedure Laterality Date  . ABDOMINAL HYSTERECTOMY    . APPENDECTOMY    . PACEMAKER IMPLANT     [employer]     Prior to Admission medications   Medication Sig Start Date End Date Taking? Authorizing Provider  amLODipine (NORVASC)  10 MG tablet Take 10 mg by mouth daily.   Yes [provider]  lisinopril (PRINIVIL,ZESTRIL) 40 MG tablet Take 40 mg by mouth daily.   Yes [provider]  metoprolol (TOPROL-XL) 200 MG 24 hr tablet Take 200 mg by mouth daily.   Yes [provider]  promethazine (PHENERGAN) 12.5 MG tablet Take 1 tablet (12.5 mg total) by mouth every 6 (six) hours as needed for nausea or vomiting. 01/27/17  Yes Merlyn Lot, MD  cyclobenzaprine (FLEXERIL) 5 MG tablet Take 1 tablet by mouth daily. 01/29/16   [provider]  ibuprofen (ADVIL,MOTRIN) 600 MG tablet Take 1 tablet by mouth 3 (three) times daily as needed. 01/29/16   [provider]  oxyCODONE-acetaminophen (PERCOCET) 5-325 MG tablet Take 2 tablets by mouth every 6 (six) hours as needed for moderate pain or severe pain. Patient not taking: Reported on 01/27/2017 10/07/16   Earleen Newport, MD  traMADol (ULTRAM) 50 MG tablet Take 1 tablet (50 mg total) by mouth every 6 (six) hours as needed. Patient not taking: Reported on 02/25/2017 01/27/17 01/27/18  Merlyn Lot, MD    Allergies Sulfa antibiotics  Family History  Problem Relation Age of Onset  . Dementia Mother   . Heart attack Father        Died age 54  . Heart disease Sister        Congential  . Heart disease Sister        "Some kind of heart surgery."    Social History Social History  Substance Use Topics  . Smoking status: Current Every Day Smoker    Packs/day: 0.50    Types: Cigarettes  . Smokeless tobacco: Never Used  . Alcohol use No    Review of Systems Constitutional: No fever/chills Eyes: No visual changes. ENT: No sore throat. Cardiovascular: Denies chest pain. Respiratory: Denies shortness of breath. Gastrointestinal:   No diarrhea.  No constipation. Genitourinary: Negative for dysuria. Musculoskeletal: Negative for back pain. Skin: Negative for rash. Neurological: Negative for headaches, focal weakness or  numbness.    ____________________________________________   PHYSICAL EXAM:  VITAL SIGNS: ED Triage Vitals  Enc Vitals Group     BP 02/25/17 1133 (!) 181/108     Pulse Rate 02/25/17 1133 60     Resp 02/25/17 1133 18     Temp 02/25/17 1133 98.3 F (36.8 C)     Temp Source 02/25/17 1133 Oral     SpO2 02/25/17 1133 99 %     Weight 02/25/17 1130 105 lb (47.6 kg)     Height 02/25/17 1130 5\' 2"  (1.575 m)     Head Circumference --      Peak Flow --      Pain Score 02/25/17 1129 10     Pain Loc --      Pain Edu? --      Excl. in [place]? --     Constitutional: Alert and oriented. Well appearing and in no acute distress. Eyes: Conjunctivae are normal. Head: Atraumatic. Nose: No congestion/rhinnorhea. Mouth/Throat: Mucous membranes are moist. Neck: No stridor.   Cardiovascular: Normal rate, regular rhythm. Grossly normal heart sounds.  Good peripheral circulation. Respiratory: Normal respiratory effort.  No retractions. Lungs CTAB. Gastrointestinal: Soft and mild to moderate tenderness in the epigastrium without rebound or guarding. No tenderness in the lower abdomen No distention. Musculoskeletal: No lower extremity tenderness nor edema. Neurologic:  Normal speech and language. No gross focal neurologic deficits are appreciated.  Skin:  Skin is warm, dry and intact. No rash noted. Psychiatric: Mood and affect are normal. Speech and behavior are normal.  ____________________________________________   LABS (all labs ordered are listed, but only abnormal results are displayed)  Labs Reviewed  COMPREHENSIVE METABOLIC PANEL - Abnormal; Notable for the following:       Result Value   Glucose, Bld 102 (*)    BUN 23 (*)    All other components within normal limits  CBC - Abnormal; Notable for the following:    RBC 5.44 (*)    Hemoglobin 16.2 (*)    HCT 48.6 (*)    All other components within normal limits  URINALYSIS, COMPLETE (UACMP) WITH MICROSCOPIC - Abnormal; Notable for the  following:    Color, Urine STRAW (*)    APPearance CLEAR (*)    Specific Gravity, Urine 1.004 (*)    Leukocytes, UA MODERATE (*)    Bacteria, UA RARE (*)    Squamous Epithelial / LPF 6-30 (*)    All other components within normal limits  TROPONIN I - Abnormal; Notable for the following:    Troponin I 0.04 (*)    All other components within normal limits  URINE CULTURE  LIPASE, BLOOD  TROPONIN I  TROPONIN I  COMPREHENSIVE METABOLIC PANEL  CBC  LIPASE, BLOOD  TROPONIN I   ____________________________________________  EKG  reviewed and interpreted by me atrial pacing, heart rate 60 Mild ST abnormality versus repolarization seen notably in V2 and V3 She has new T-wave inversions and in inferior and lateral distribution Compared  with previous, ST abnormality noted in V2 and V3 appears chronic, but slightly more prominent in V2 today, T-wave inversions in inferior and lateral region are new  This EKG was reviewed with Dr. Percival Spanish. ____________________________________________  RADIOLOGY  Ct Abdomen Pelvis W Contrast  Result Date: 02/25/2017 CLINICAL DATA:  Left lower quadrant pain. Patient states she has pancreatitis. EXAM: CT ABDOMEN AND PELVIS WITH CONTRAST TECHNIQUE: Multidetector CT imaging of the abdomen and pelvis was performed using the standard protocol following bolus administration of intravenous contrast. CONTRAST:  14mL ISOVUE-300 IOPAMIDOL (ISOVUE-300) INJECTION 61% COMPARISON:  January 27, 2017 FINDINGS: Lower chest: A nodule abutting the right hemidiaphragm on series 4, image 9 measures 5 mm, unchanged. Possible nodule in the left lung base measuring 4 mm, not included on the previous study. No acute abnormalities identified in the lung bases. Cardiomegaly is noted. Hepatobiliary: Hepatic steatosis. The liver, gallbladder, and portal vein are otherwise normal. Pancreas: Dilatation of the pancreatic duct, primarily within the head, neck, and proximal body of the pancreas  is more pronounced in the interval measuring up to 6 mm. There is a stone within or adjacent to the pancreatic duct of the tail on coronal image 25 which is stable. There is a calcification in the head of the pancreas on coronal image 31 either within or adjacent to the distal pancreatic duct. Spleen: Normal in size without focal abnormality. Adrenals/Urinary Tract: The nodule in the right adrenal gland measures 18 mm on both studies with an attenuation of 45 Hounsfield units. The left adrenal gland is normal. A cortical calcification is seen in the left kidney. Vascular calcifications are associated with the left greater than right kidney. There is a stone in the lower pole left kidney. A tiny probable cyst is seen in the right kidney. Multiple cysts are seen on the left. There is a lesion on series 2, image 31 which is stable. It is indeterminate however with an attenuation of greater than 20 Hounsfield units. Both kidneys demonstrate an extrarenal pelvis with no acute hydronephrosis or perinephric stranding. The ureters are normal in appearance. The bladder is unremarkable. Stomach/Bowel: The stomach and small bowel are normal. Scattered colonic diverticula are identified without diverticulitis. Patient is status post appendectomy. Vascular/Lymphatic: Atherosclerotic changes are seen in the nonaneurysmal aorta, iliac vessels common femoral vessels. No adenopathy. Reproductive: Status post hysterectomy. No adnexal masses. Other: No free air or free fluid. Musculoskeletal: Degenerative changes are seen in the lumbar spine. IMPRESSION: 1. No evidence of acute pancreatitis. The pancreatic duct is dilated however extending from the distal body into the head. This dilatation has worsened in the interval. There are 2 tiny calcifications either in or immediately adjacent to the pancreatic duct which are stable. No definitive cause for the increasing caliber of the pancreatic duct in the interval. 2. Stable 5 mm nodule in  the right lung base. No follow-up needed if patient is low-risk. Non-contrast chest CT can be considered in 12 months if patient is high-risk. This recommendation follows the consensus statement: Guidelines for Management of Incidental Pulmonary Nodules Detected on CT Images: From the Fleischner Society 2017; Radiology 2017; 284:228-243. 3. Indeterminate left renal lesion described on previous CT scan is stable. Recommend MRI for further characterization as previously recommended. 4. Stable right adrenal nodule. Recommend attention to this nodule on the MRI to assess the renal lesion. Aortic Atherosclerosis (ICD10-I70.0). Electronically Signed   By: Dorise Bullion III M.D   On: 02/25/2017 14:47    ____________________________________________   PROCEDURES  Procedure(s)  performed: None  Procedures  Critical Care performed: No  ____________________________________________   INITIAL IMPRESSION / ASSESSMENT AND PLAN / ED COURSE  Pertinent labs & imaging results that were available during my care of the patient were reviewed by me and considered in my medical decision making (see chart for details).  Differential diagnosis includes but is not limited to, abdominal perforation, aortic dissection, cholecystitis, appendicitis, diverticulitis, colitis, esophagitis/gastritis, kidney stone, pyelonephritis, urinary tract infection, aortic aneurysm. All are considered in decision and treatment plan. Based upon the patient's presentation and risk factors, patient has a history of chronic pancreatic disease and this symptomatology sounds consistent with previous. We will further evaluate with labs, proceed with CT scan of further evaluate for acute pancreatic or intra-abdominal pathology. Denies any cardiac or pulmonary symptoms  Of note, patient's EKG does appear abnormal. However she complains of no chest pain.   Clinical Course as of Feb 25 2149  Sat Feb 25, 2017  1423 EKG changes noted from  previous. Notably inversions now in inferior and lateral distribution. V2 and V3 appear consistent with previous.  Troponin is minimally elevated at 0.04. She continues to deny any chest pain or trouble breathing. Modest improvement reported in upper abdominal pain. Ct pending  [MQ]  1510 Discussed with Dr. Percival Spanish, advises to trend troponins, give aspirin. Reviewed EKG with him and priors, he reviewed via [facility] and notes not consitent with a STEMI. I would agree. Advises admit for rule out.  at present, patient reports pain is mild, still in the epigastrium. Continues to deny any chest symptoms or chest pain  CT reviewed, pancreatic ductal dilatation is noted but LFTs are normal. I doubt acute ductal obstruction present at this time, but will continue to be monitored as an inpatient  [MQ]    Clinical Course User Index [MQ] Delman Kitten, MD     ____________________________________________   FINAL CLINICAL IMPRESSION(S) / ED DIAGNOSES  Final diagnoses:  Epigastric abdominal pain  EKG abnormalities  Troponin level elevated  Lung nodule      NEW MEDICATIONS STARTED DURING THIS VISIT:  Current Discharge Medication List       Note:  This document was prepared using Dragon voice recognition software and may include unintentional dictation errors.     Delman Kitten, MD 02/25/17 2150

## 2017-02-25 NOTE — Consult Note (Signed)
CARDIOLOGY CONSULT NOTE  Patient ID: Lori Orr MRN: [id number] DOB/AGE: [birth year] 66 y.o.  Admit date: 02/25/2017 Primary Physician   Changing to primary MD here,  was in [facility] Cardiologist  Cardiologist in [place] Complaint   Abdominal pain Requesting  Dr. Jacqualine Code  HPI:  The patient presented with abdominal pain.  She thinks this feels like her previous pancreatitis. She was noted however, to have an abnormal EKG and minimally elevated troponin and we are consulted.  She denies any new chest pain.  She does get some fleeting chest pain at times.  However, this is chronic and not associated with other cardiovascular complaints.  She is able to walk and be active with out bringing on chest pain or SOB.  The pain she is having currently is a lower to mid abdominal pain.  She has had no change in her bowel movements.  She felt chilled but does not report fevers.  The patient denies any new symptoms such as neck or arm discomfort. There has been no new shortness of breath, PND or orthopnea. There have been no reported palpitations, presyncope or syncope.  Of note the patient has a past history of pacemaker for "slow heart rate".  This was placed in [place].  She was told that she has had an MI but does not report a cath.  There might have been a stress test in the past.     Past Medical History:  Diagnosis Date  . Cancer ([place])   . CHF (congestive heart failure) ([place])   . Hypertension   . Pacemaker   . Pancreatitis     Past Surgical History:  Procedure Laterality Date  . ABDOMINAL HYSTERECTOMY    . APPENDECTOMY    . CARDIAC SURGERY      Allergies  Allergen Reactions  . Sulfa Antibiotics Rash   Prior to Admission medications   Medication Sig Start Date End Date Taking? Authorizing Provider  amLODipine (NORVASC) 10 MG tablet Take 10 mg by mouth daily.   Yes [provider]  lisinopril (PRINIVIL,ZESTRIL) 40 MG tablet Take 40 mg by mouth daily.   Yes  [provider]  metoprolol (TOPROL-XL) 200 MG 24 hr tablet Take 200 mg by mouth daily.   Yes [provider]  promethazine (PHENERGAN) 12.5 MG tablet Take 1 tablet (12.5 mg total) by mouth every 6 (six) hours as needed for nausea or vomiting. 01/27/17  Yes Merlyn Lot, MD  cyclobenzaprine (FLEXERIL) 5 MG tablet Take 1 tablet by mouth daily. 01/29/16   [provider]  ibuprofen (ADVIL,MOTRIN) 600 MG tablet Take 1 tablet by mouth 3 (three) times daily as needed. 01/29/16   [provider]  oxyCODONE-acetaminophen (PERCOCET) 5-325 MG tablet Take 2 tablets by mouth every 6 (six) hours as needed for moderate pain or severe pain. Patient not taking: Reported on 01/27/2017 10/07/16   Earleen Newport, MD  traMADol (ULTRAM) 50 MG tablet Take 1 tablet (50 mg total) by mouth every 6 (six) hours as needed. Patient not taking: Reported on 02/25/2017 01/27/17 01/27/18  Merlyn Lot, MD     (Not in a hospital admission) Family History  Problem Relation Age of Onset  . Dementia Mother   . Heart attack Father     Social History   Social History  . Marital status: Divorced    Spouse name: N/A  . Number of children: N/A  . Years of education: N/A   Occupational History  . Not on file.  Social History Main Topics  . Smoking status: Current Every Day Smoker    Packs/day: 0.50    Types: Cigarettes  . Smokeless tobacco: Never Used  . Alcohol use No  . Drug use: No  . Sexual activity: Not on file   Other Topics Concern  . Not on file   Social History Narrative  . No narrative on file     ROS:    As stated in the HPI and negative for all other systems.  Physical Exam: Blood pressure (!) 159/88, pulse (!) 59, temperature 98.3 F (36.8 C), temperature source Oral, resp. rate 16, height 5\' 2"  (1.575 m), weight 105 lb (47.6 kg), SpO2 96 %.  GENERAL:  Well appearing HEENT:  Pupils equal round and reactive, fundi not visualized, oral mucosa  unremarkable NECK:  No jugular venous distention, waveform within normal limits, carotid upstroke brisk and symmetric, no bruits, no thyromegaly LYMPHATICS:  No cervical, inguinal adenopathy LUNGS:  Clear to auscultation bilaterally BACK:  No CVA tenderness CHEST:  Unremarkable HEART:  PMI not displaced or sustained,S1 and S2 within normal limits, no S3, no S4, no clicks, no rubs, no murmurs ABD:  Flat, positive bowel sounds normal in frequency in pitch, no bruits, no rebound, no guarding, no midline pulsatile mass, no hepatomegaly, no splenomegaly, mild abdominal pain to deep palpation.  EXT:  2 plus pulses throughout, no edema, no cyanosis no clubbing SKIN:  No rashes no nodules NEURO:  Cranial nerves II through XII grossly intact, motor grossly intact throughout PSYCH:  Cognitively intact, oriented to person place and time  Labs: Lab Results  Component Value Date   BUN 23 (H) 02/25/2017   Lab Results  Component Value Date   CREATININE 0.79 02/25/2017   Lab Results  Component Value Date   NA 141 02/25/2017   K 4.3 02/25/2017   CL 107 02/25/2017   CO2 28 02/25/2017   Lab Results  Component Value Date   TROPONINI 0.04 ([place]) 02/25/2017   Lab Results  Component Value Date   WBC 8.1 02/25/2017   HGB 16.2 (H) 02/25/2017   HCT 48.6 (H) 02/25/2017   MCV 89.4 02/25/2017   PLT 238 02/25/2017   No results found for: CHOL, HDL, LDLCALC, LDLDIRECT, TRIG, CHOLHDL Lab Results  Component Value Date   ALT 22 02/25/2017   AST 24 02/25/2017   ALKPHOS 71 02/25/2017   BILITOT 0.7 02/25/2017      Radiology:   CXR:   No active cardiopulmonary disease.   EKG:   Atrial paced rhythm, inferior and lateral T wave inversion consider ischemia.    ASSESSMENT AND PLAN:   ABNORMAL EKG:  She has no symptoms consistent with ACS.  She has no chest pain.  The troponin is minimally elevated but not diagnostic.  Given this the EKG findings are non specific.  She can be ruled out.  Check an echo.   If this is all normal we can consider out patient stress testing.  We need to try to get her out patient records.  PACEMAKER:  She reports that she is overdue for follow up.  We will arrange follow up in our clinic.   SignedMinus Breeding 02/25/2017, 3:17 PM

## 2017-02-25 NOTE — Plan of Care (Signed)
Problem: Education: Goal: Knowledge of [facility] Education information/materials will improve Outcome: Progressing Patient is pleasant and states understanding.   Problem: Safety: Goal: Ability to remain free from injury will improve Outcome: Progressing Patient has been free from injury  Problem: Health Behavior/Discharge Planning: Goal: Ability to manage health-related needs will improve Outcome: Progressing Patient is progressing in this area.   Problem: Pain Managment: Goal: General experience of comfort will improve Outcome: Progressing Patient is progressing in this area.   Problem: Physical Regulation: Goal: Ability to maintain clinical measurements within normal limits will improve Outcome: Progressing Patient is progressing in this area.  Goal: Will remain free from infection Outcome: Progressing Patient is progressing in this area.   Problem: Skin Integrity: Goal: Risk for impaired skin integrity will decrease Outcome: Progressing Patient is progressing in this area.   Problem: Tissue Perfusion: Goal: Risk factors for ineffective tissue perfusion will decrease Outcome: Progressing Patient is progressing in this area.   Problem: Activity: Goal: Risk for activity intolerance will decrease Outcome: Progressing Patient is progressing in this area.   Problem: Fluid Volume: Goal: Ability to maintain a balanced intake and output will improve Outcome: Progressing Patient is progressing in this area.   Problem: Nutrition: Goal: Adequate nutrition will be maintained Outcome: Progressing Patient is progressing in this area.   Problem: Bowel/Gastric: Goal: Will not experience complications related to bowel motility Outcome: Progressing Patient is progressing in this area.   Comments: Patient is improving. Patient is pleasant. Experiences no chest pain. Patient refused nitro due to no pain in chest. Patient complaining of abdomen pain. Meds given.

## 2017-02-26 ENCOUNTER — Encounter: Payer: Self-pay | Admitting: Gastroenterology

## 2017-02-26 DIAGNOSIS — R748 Abnormal levels of other serum enzymes: Secondary | ICD-10-CM | POA: Diagnosis not present

## 2017-02-26 DIAGNOSIS — R109 Unspecified abdominal pain: Secondary | ICD-10-CM | POA: Diagnosis not present

## 2017-02-26 LAB — COMPREHENSIVE METABOLIC PANEL
ALK PHOS: 60 U/L (ref 38–126)
ALT: 17 U/L (ref 14–54)
ANION GAP: 4 — AB (ref 5–15)
AST: 22 U/L (ref 15–41)
Albumin: 3.4 g/dL — ABNORMAL LOW (ref 3.5–5.0)
BILIRUBIN TOTAL: 0.5 mg/dL (ref 0.3–1.2)
BUN: 13 mg/dL (ref 6–20)
CALCIUM: 8.4 mg/dL — AB (ref 8.9–10.3)
CO2: 27 mmol/L (ref 22–32)
Chloride: 110 mmol/L (ref 101–111)
Creatinine, Ser: 0.63 mg/dL (ref 0.44–1.00)
Glucose, Bld: 116 mg/dL — ABNORMAL HIGH (ref 65–99)
Potassium: 4 mmol/L (ref 3.5–5.1)
Sodium: 141 mmol/L (ref 135–145)
TOTAL PROTEIN: 6.3 g/dL — AB (ref 6.5–8.1)

## 2017-02-26 LAB — CBC
HCT: 41.2 % (ref 35.0–47.0)
HEMOGLOBIN: 13.8 g/dL (ref 12.0–16.0)
MCH: 30.1 pg (ref 26.0–34.0)
MCHC: 33.5 g/dL (ref 32.0–36.0)
MCV: 89.6 fL (ref 80.0–100.0)
PLATELETS: 198 10*3/uL (ref 150–440)
RBC: 4.59 MIL/uL (ref 3.80–5.20)
RDW: 13.2 % (ref 11.5–14.5)
WBC: 7.4 10*3/uL (ref 3.6–11.0)

## 2017-02-26 LAB — TROPONIN I
Troponin I: <0.03 ng/mL (ref <0.03)
Troponin I: <0.03 ng/mL (ref <0.03)

## 2017-02-26 LAB — LIPASE, BLOOD: LIPASE: 218 U/L — AB (ref 11–51)

## 2017-02-26 MED ORDER — TRAMADOL HCL 50 MG PO TABS: 50.0000 mg | ORAL_TABLET | Freq: Four times a day (QID) | ORAL | 0 refills | Status: DC | PRN

## 2017-02-26 NOTE — Progress Notes (Signed)
Pt instructed on discharge instructions and medications and verbalized understanding, prescriptions given to pt, IV catheters discontinued, tips intact, telemetry monitor removed, pt left via wheelchair in stable condition with no s/s of distress or discomfort. Harlene Ramus, RN

## 2017-02-26 NOTE — Discharge Summary (Signed)
[facility] [place] NAME: Lori Orr    MR#:  [id number]  DATE OF BIRTH:  12/01/1950  DATE OF ADMISSION:  02/25/2017 ADMITTING PHYSICIAN: Idelle Crouch, MD  DATE OF DISCHARGE: 02/26/2017  PRIMARY CARE PHYSICIAN: Patient, No Pcp Per    ADMISSION DIAGNOSIS:  Epigastric abdominal pain [R10.13] EKG abnormalities [R94.31] Lung nodule [R91.1] Troponin level elevated [R74.8]  DISCHARGE DIAGNOSIS:  Active Problems:   Abdominal pain Elevated troponin   SECONDARY DIAGNOSIS:   Past Medical History:  Diagnosis Date  . Bradycardia   . Cancer ([place])   . CHF (congestive heart failure) ([place])   . Hypertension   . Pancreatitis     HOSPITAL COURSE:   66 year old female with chronic pancreatitis, diastolic heart failure and pacemaker placement who presented to the emergency room with abdominal pain and incidentally found to have a troponin of 0.4.  1. Abdominal pain: Patient underwent CT scan which did not show evidence of acute pancreatitis. Abdominal pain has improved despite an elevated lipase level this morning. Patient was tolerating her diet this morning when I went to see her. She does not need GI consultation. I did discuss case briefly with the GI physician. Does not appear to be acute pancreatitis flare despite slightly elevated lipase level this morning as patient is tolerating her diet without nausea, vomiting or abdominal pain.   2. Elevated troponin: Her first troponin was 0.04 subsequently the other troponins were within normal limits. Her EKG showed atrial paced rhythm. She will follow-up with cardiology as an outpatient.  3. Essential hypertension: Continue lisinopril and Norvasc and metoprolol   4. Stable 5 mm nodule in the right lung base. No follow-up needed if patient is low-risk. Non-contrast chest CT can be considered in 12 months if patient is high-risk. This recommendation follows the consensus statement:  Guidelines for Management of Incidental Pulmonary Nodules Detected on CT Images: From the Fleischner Society 2017; Radiology 2017; 284:228-243.  5. Indeterminate left renal lesion described on previous CT scan is stable. Recommend MRI for further characterization as previously Recommended. She has a follow-up with primary care physician on the 24th of this month. She does not know who the primary care physician is. She will request these records when she arrives to the MDs office.   DISCHARGE CONDITIONS AND DIET:   Stable for discharge on heart healthy diet  CONSULTS OBTAINED:  Treatment Team:  Minus Breeding, MD  DRUG ALLERGIES:   Allergies  Allergen Reactions  . Sulfa Antibiotics Rash    DISCHARGE MEDICATIONS:   Current Discharge Medication List    CONTINUE these medications which have CHANGED   Details  traMADol (ULTRAM) 50 MG tablet Take 1 tablet (50 mg total) by mouth every 6 (six) hours as needed. Qty: 15 tablet, Refills: 0      CONTINUE these medications which have NOT CHANGED   Details  amLODipine (NORVASC) 10 MG tablet Take 10 mg by mouth daily.    lisinopril (PRINIVIL,ZESTRIL) 40 MG tablet Take 40 mg by mouth daily.    metoprolol (TOPROL-XL) 200 MG 24 hr tablet Take 200 mg by mouth daily.    promethazine (PHENERGAN) 12.5 MG tablet Take 1 tablet (12.5 mg total) by mouth every 6 (six) hours as needed for nausea or vomiting. Qty: 12 tablet, Refills: 0    cyclobenzaprine (FLEXERIL) 5 MG tablet Take 1 tablet by mouth daily.      STOP taking these medications     ibuprofen (ADVIL,MOTRIN) 600  MG tablet      oxyCODONE-acetaminophen (PERCOCET) 5-325 MG tablet           Today   CHIEF COMPLAINT:  Doing well this morning. Tolerated diet without abdominal pain, nausea or vomiting.   VITAL SIGNS:  Blood pressure 123/71, pulse 60, temperature 98.7 F (37.1 C), temperature source Oral, resp. rate 18, height 5\' 2"  (1.575 m), weight 53.8 kg (118 lb 8 oz),  SpO2 93 %.   REVIEW OF SYSTEMS:  Review of Systems  Constitutional: Negative.  Negative for chills, fever and malaise/fatigue.  HENT: Negative.  Negative for ear discharge, ear pain, hearing loss, nosebleeds and sore throat.   Eyes: Negative.  Negative for blurred vision and pain.  Respiratory: Negative.  Negative for cough, hemoptysis, shortness of breath and wheezing.   Cardiovascular: Negative.  Negative for chest pain, palpitations and leg swelling.  Gastrointestinal: Negative.  Negative for abdominal pain, blood in stool, diarrhea, nausea and vomiting.  Genitourinary: Negative.  Negative for dysuria.  Musculoskeletal: Negative.  Negative for back pain.  Skin: Negative.   Neurological: Negative for dizziness, tremors, speech change, focal weakness, seizures and headaches.  Endo/Heme/Allergies: Negative.  Does not bruise/bleed easily.  Psychiatric/Behavioral: Negative.  Negative for depression, hallucinations and suicidal ideas.     PHYSICAL EXAMINATION:  GENERAL:  66 y.o.-year-old patient lying in the bed with no acute distress.  NECK:  Supple, no jugular venous distention. No thyroid enlargement, no tenderness.  LUNGS: Normal breath sounds bilaterally, no wheezing, rales,rhonchi  No use of accessory muscles of respiration.  CARDIOVASCULAR: S1, S2 normal. No murmurs, rubs, or gallops.  ABDOMEN: Soft, non-tender, non-distended. Bowel sounds present. No organomegaly or mass.  EXTREMITIES: No pedal edema, cyanosis, or clubbing.  PSYCHIATRIC: The patient is alert and oriented x 3.  SKIN: No obvious rash, lesion, or ulcer.   DATA REVIEW:   CBC  Recent Labs Lab 02/26/17 0446  WBC 7.4  HGB 13.8  HCT 41.2  PLT 198    Chemistries   Recent Labs Lab 02/26/17 0446  NA 141  K 4.0  CL 110  CO2 27  GLUCOSE 116*  BUN 13  CREATININE 0.63  CALCIUM 8.4*  AST 22  ALT 17  ALKPHOS 60  BILITOT 0.5    Cardiac Enzymes  Recent Labs Lab 02/25/17 1734 02/25/17 2358  02/26/17 0446  TROPONINI <0.03 <0.03 <0.03    Microbiology Results  @[email or web address] @  RADIOLOGY:  Ct Abdomen Pelvis W Contrast  Result Date: 02/25/2017 CLINICAL DATA:  Left lower quadrant pain. Patient states she has pancreatitis. EXAM: CT ABDOMEN AND PELVIS WITH CONTRAST TECHNIQUE: Multidetector CT imaging of the abdomen and pelvis was performed using the standard protocol following bolus administration of intravenous contrast. CONTRAST:  36mL ISOVUE-300 IOPAMIDOL (ISOVUE-300) INJECTION 61% COMPARISON:  January 27, 2017 FINDINGS: Lower chest: A nodule abutting the right hemidiaphragm on series 4, image 9 measures 5 mm, unchanged. Possible nodule in the left lung base measuring 4 mm, not included on the previous study. No acute abnormalities identified in the lung bases. Cardiomegaly is noted. Hepatobiliary: Hepatic steatosis. The liver, gallbladder, and portal vein are otherwise normal. Pancreas: Dilatation of the pancreatic duct, primarily within the head, neck, and proximal body of the pancreas is more pronounced in the interval measuring up to 6 mm. There is a stone within or adjacent to the pancreatic duct of the tail on coronal image 25 which is stable. There is a calcification in the head of the pancreas on coronal image 31 either within  or adjacent to the distal pancreatic duct. Spleen: Normal in size without focal abnormality. Adrenals/Urinary Tract: The nodule in the right adrenal gland measures 18 mm on both studies with an attenuation of 45 Hounsfield units. The left adrenal gland is normal. A cortical calcification is seen in the left kidney. Vascular calcifications are associated with the left greater than right kidney. There is a stone in the lower pole left kidney. A tiny probable cyst is seen in the right kidney. Multiple cysts are seen on the left. There is a lesion on series 2, image 31 which is stable. It is indeterminate however with an attenuation of greater than 20 Hounsfield units.  Both kidneys demonstrate an extrarenal pelvis with no acute hydronephrosis or perinephric stranding. The ureters are normal in appearance. The bladder is unremarkable. Stomach/Bowel: The stomach and small bowel are normal. Scattered colonic diverticula are identified without diverticulitis. Patient is status post appendectomy. Vascular/Lymphatic: Atherosclerotic changes are seen in the nonaneurysmal aorta, iliac vessels common femoral vessels. No adenopathy. Reproductive: Status post hysterectomy. No adnexal masses. Other: No free air or free fluid. Musculoskeletal: Degenerative changes are seen in the lumbar spine. IMPRESSION: 1. No evidence of acute pancreatitis. The pancreatic duct is dilated however extending from the distal body into the head. This dilatation has worsened in the interval. There are 2 tiny calcifications either in or immediately adjacent to the pancreatic duct which are stable. No definitive cause for the increasing caliber of the pancreatic duct in the interval. 2. Stable 5 mm nodule in the right lung base. No follow-up needed if patient is low-risk. Non-contrast chest CT can be considered in 12 months if patient is high-risk. This recommendation follows the consensus statement: Guidelines for Management of Incidental Pulmonary Nodules Detected on CT Images: From the Fleischner Society 2017; Radiology 2017; 284:228-243. 3. Indeterminate left renal lesion described on previous CT scan is stable. Recommend MRI for further characterization as previously recommended. 4. Stable right adrenal nodule. Recommend attention to this nodule on the MRI to assess the renal lesion. Aortic Atherosclerosis (ICD10-I70.0). Electronically Signed   By: Dorise Bullion III M.D   On: 02/25/2017 14:47      Current Discharge Medication List    CONTINUE these medications which have CHANGED   Details  traMADol (ULTRAM) 50 MG tablet Take 1 tablet (50 mg total) by mouth every 6 (six) hours as needed. Qty: 15  tablet, Refills: 0      CONTINUE these medications which have NOT CHANGED   Details  amLODipine (NORVASC) 10 MG tablet Take 10 mg by mouth daily.    lisinopril (PRINIVIL,ZESTRIL) 40 MG tablet Take 40 mg by mouth daily.    metoprolol (TOPROL-XL) 200 MG 24 hr tablet Take 200 mg by mouth daily.    promethazine (PHENERGAN) 12.5 MG tablet Take 1 tablet (12.5 mg total) by mouth every 6 (six) hours as needed for nausea or vomiting. Qty: 12 tablet, Refills: 0    cyclobenzaprine (FLEXERIL) 5 MG tablet Take 1 tablet by mouth daily.      STOP taking these medications     ibuprofen (ADVIL,MOTRIN) 600 MG tablet      oxyCODONE-acetaminophen (PERCOCET) 5-325 MG tablet           Management plans discussed with the patient and she is in agreement. Stable for discharge home  Patient should follow up with pcp and cardiology  CODE STATUS:     Code Status Orders        Start  Ordered   02/25/17 1654  Full code  Continuous     02/25/17 1653    Code Status History    Date Active Date Inactive Code Status Order ID Comments User Context   This patient has a current code status but no historical code status.    Advance Directive Documentation     Most Recent Value  Type of Advance Directive  Healthcare Power of Attorney, Living will  Pre-existing out of facility DNR order (yellow form or pink MOST form)  -  "MOST" Form in Place?  -      TOTAL TIME TAKING CARE OF THIS PATIENT: 37 minutes.    Note: This dictation was prepared with Dragon dictation along with smaller phrase technology. Any transcriptional errors that result from this process are unintentional.  Blanca Carreon M.D on 02/26/2017 at 9:15 AM  Between 7am to 6pm - Pager - [phone] After 6pm go to [email or web address] - password [email or web address] [facility]  Office  [phone]  CC: Primary care physician; Patient, No Pcp Per

## 2017-02-27 LAB — URINE CULTURE: Special Requests: NORMAL

## 2017-03-06 ENCOUNTER — Ambulatory Visit: Payer: Self-pay | Admitting: Family Medicine

## 2017-03-13 ENCOUNTER — Ambulatory Visit (INDEPENDENT_AMBULATORY_CARE_PROVIDER_SITE_OTHER): Payer: Medicare Other | Admitting: Family Medicine

## 2017-03-13 ENCOUNTER — Encounter: Payer: Self-pay | Admitting: Family Medicine

## 2017-03-13 VITALS — BP 132/76 | HR 60 | Temp 98.4°F | Resp 16 | Ht 62.0 in | Wt 117.4 lb

## 2017-03-13 DIAGNOSIS — H9202 Otalgia, left ear: Secondary | ICD-10-CM

## 2017-03-13 DIAGNOSIS — H60392 Other infective otitis externa, left ear: Secondary | ICD-10-CM | POA: Diagnosis not present

## 2017-03-13 MED ORDER — AMOXICILLIN-POT CLAVULANATE 875-125 MG PO TABS: 1.0000 | ORAL_TABLET | Freq: Two times a day (BID) | ORAL | 0 refills | Status: DC

## 2017-03-13 MED ORDER — CYCLOBENZAPRINE HCL 10 MG PO TABS: 10.0000 mg | ORAL_TABLET | Freq: Three times a day (TID) | ORAL | 0 refills | Status: DC | PRN

## 2017-03-13 MED ORDER — IPRATROPIUM BROMIDE 0.06 % NA SOLN: 2.0000 | Freq: Four times a day (QID) | NASAL | 0 refills | Status: DC

## 2017-03-13 MED ORDER — CIPROFLOXACIN-DEXAMETHASONE 0.3-0.1 % OT SUSP: 4.0000 [drp] | Freq: Two times a day (BID) | OTIC | 0 refills | Status: DC

## 2017-03-13 NOTE — Patient Instructions (Addendum)
Thank you for coming to the clinic today.  1.  You have otitis externa, which is infection of ear canal. Sometimes this affects the inner ear or ear drum, but your ear drum looks healthy but does have fluid behind it  Treatment is Ciprodex (topical antibiotic / steroid ear drops), 4 drops per dose, 2 times a day for 7 days, only inside Left ear, lay on your right side with left ear facing up to put drops in and stay that way for 10 to 15 min to allow medicine to sink in.  Also I started you on Augmentin as well oral antibiotic for twice daily for 7 days  Do not put anything into ear including Q-tips or any liquids. The drainage will come out eventually.  AVOID swimming or submerging ear. Try to avoid getting any water inside ear canal (especially during showering).  Start Cyclobenzapine (Flexeril) 10mg  tablets (muscle relaxant) - start with half (cut) to one whole pill at night for muscle relaxant - may make you sedated or sleepy (be careful driving or working on this) if tolerated you can take half to whole tab 2 to 3 times daily or every 8 hours as needed  If you develop significant pain, loss of hearing, fevers/chills, spreading redness on skin or new symptoms, please return sooner.  If your symptoms are not improving, recommend to follow-up in 7 to 10 days to re-check L ear otitis externa infection.  Please schedule a Follow-up Appointment to: Return in about 1 week (around 03/20/2017) for as scheduled for new patient, re-check L ear.  If you have any other questions or concerns, please feel free to call the clinic or send a message through [place]. You may also schedule an earlier appointment if necessary.  Additionally, you may be receiving a survey about your experience at our clinic within a few days to 1 week by e-mail or mail. We value your feedback.  Nobie Putnam, DO [place]

## 2017-03-13 NOTE — Progress Notes (Signed)
Subjective:    Patient ID: Lori Orr, female    DOB: 10-11-1950, 66 y.o.   MRN: [id number]  Lori Orr is a 66 y.o. female presenting on 03/13/2017 for Ear Pain (Left side swollen very painful onset 4 days )  Moved from [place] >3 weeks ago. Previously visited family in [place] periodically.  HPI   Here today for acute visit only, since not officially established with clinic for new patient encounter yet, we agreed to work her in as an acute visit today.  LEFT EAR PAIN / OTITIS EXTERNA - Reports new complaint her Left ear hurting for past 3-4 days (mostly constant without relief), now with worsening and not improved. Seemed worse with certain movements, occasional inner ear symptoms feel dizzy. Noticed swelling and debris of external ear canal. Only trigger possibly water from shower and stated was in cold air - Has not been on any antibiotics recently - Prior history on Flexeril PRN with some relief, need new rx - Last seen at [facility] admit 9/15, given rx Tramadol PRN on dischare - Admits  - Denies fever but admits feeling warm. Denies chills, nausea vomiting hearing loss  Additional history - not focus of acute visit today - S/p Pacemaker, followed by Cardiology next visit 10/28 - History of nephrolithiasis, history of surgical treatment for stone before, was told to avoid NSAID - History of Chronic Pancreatitits - Last hospitalization 02/25/17   No flowsheet data found.  Social History  Substance Use Topics  . Smoking status: Current Every Day Smoker    Packs/day: 0.50    Types: Cigarettes  . Smokeless tobacco: Current User  . Alcohol use No    Review of Systems Per HPI unless specifically indicated above     Objective:    BP 132/76   Pulse 60   Temp 98.4 F (36.9 C) (Oral)   Resp 16   Ht 5\' 2"  (1.575 m)   Wt 117 lb 6.4 oz (53.3 kg)   SpO2 100%   BMI 21.47 kg/m   Wt Readings from Last 3 Encounters:  03/13/17 117 lb 6.4 oz (53.3 kg)  02/26/17 118 lb 8 oz  (53.8 kg)  10/07/16 140 lb (63.5 kg)    Physical Exam  Constitutional: She is oriented to person, place, and time. She appears well-developed and well-nourished. No distress.  Chronically ill appearing thin 66 year old female, mildly uncomfortable, cooperative  HENT:  Head: Normocephalic and atraumatic.  Mouth/Throat: Oropharynx is clear and moist.  Mild tender to L external ear and tragus, localized radiation of pain inferior to ear. Mastoid process non tender.  Frontal / maxillary sinuses non-tender. Nares patent without purulence or edema.  Left TM with notable edema and some erythema with debris of external ear canal, L TM limited view without obvious erythema or purulence, notable opaque effusion. Right TM mild clear effusion with pressure without bulging, no erythema.  Oropharynx clear without erythema, exudates, edema or asymmetry.  Poor dentition has only partial lower teeth in place. No denture  Eyes: Conjunctivae are normal. Right eye exhibits no discharge. Left eye exhibits no discharge.  Neck: Normal range of motion. Neck supple. No thyromegaly present.  Mild L>R tenderness without lymphadenopathy  Cardiovascular: Normal rate, regular rhythm, normal heart sounds and intact distal pulses.   No murmur heard. Pulmonary/Chest: Effort normal. No respiratory distress. She has no wheezes. She has no rales.  Lymphadenopathy:    She has no cervical adenopathy.  Neurological: She is alert and oriented to  person, place, and time.  Skin: Skin is warm and dry. No rash noted. She is not diaphoretic. No erythema.  Psychiatric: She has a normal mood and affect. Her behavior is normal.  Well groomed, good eye contact, normal speech and thoughts  Nursing note and vitals reviewed.  Results for orders placed or performed during the hospital encounter of 02/25/17  Urine Culture  Result Value Ref Range   Specimen Description URINE, CLEAN CATCH    Special Requests Normal    Culture MULTIPLE  SPECIES PRESENT, SUGGEST RECOLLECTION (A)    Report Status 02/27/2017 FINAL   Lipase, blood  Result Value Ref Range   Lipase 33 11 - 51 U/L  Comprehensive metabolic panel  Result Value Ref Range   Sodium 141 135 - 145 mmol/L   Potassium 4.3 3.5 - 5.1 mmol/L   Chloride 107 101 - 111 mmol/L   CO2 28 22 - 32 mmol/L   Glucose, Bld 102 (H) 65 - 99 mg/dL   BUN 23 (H) 6 - 20 mg/dL   Creatinine, Ser 0.79 0.44 - 1.00 mg/dL   Calcium 9.5 8.9 - 10.3 mg/dL   Total Protein 7.5 6.5 - 8.1 g/dL   Albumin 4.1 3.5 - 5.0 g/dL   AST 24 15 - 41 U/L   ALT 22 14 - 54 U/L   Alkaline Phosphatase 71 38 - 126 U/L   Total Bilirubin 0.7 0.3 - 1.2 mg/dL   GFR calc non Af Amer >60 >60 mL/min   GFR calc Af Amer >60 >60 mL/min   Anion gap 6 5 - 15  CBC  Result Value Ref Range   WBC 8.1 3.6 - 11.0 K/uL   RBC 5.44 (H) 3.80 - 5.20 MIL/uL   Hemoglobin 16.2 (H) 12.0 - 16.0 g/dL   HCT 48.6 (H) 35.0 - 47.0 %   MCV 89.4 80.0 - 100.0 fL   MCH 29.9 26.0 - 34.0 pg   MCHC 33.4 32.0 - 36.0 g/dL   RDW 12.9 11.5 - 14.5 %   Platelets 238 150 - 440 K/uL  Urinalysis, Complete w Microscopic  Result Value Ref Range   Color, Urine STRAW (A) YELLOW   APPearance CLEAR (A) CLEAR   Specific Gravity, Urine 1.004 (L) 1.005 - 1.030   pH 7.0 5.0 - 8.0   Glucose, UA NEGATIVE NEGATIVE mg/dL   Hgb urine dipstick NEGATIVE NEGATIVE   Bilirubin Urine NEGATIVE NEGATIVE   Ketones, ur NEGATIVE NEGATIVE mg/dL   Protein, ur NEGATIVE NEGATIVE mg/dL   Nitrite NEGATIVE NEGATIVE   Leukocytes, UA MODERATE (A) NEGATIVE   RBC / HPF 0-5 0 - 5 RBC/hpf   WBC, UA 6-30 0 - 5 WBC/hpf   Bacteria, UA RARE (A) NONE SEEN   Squamous Epithelial / LPF 6-30 (A) NONE SEEN   Mucus PRESENT   Troponin I  Result Value Ref Range   Troponin I 0.04 (HH) <0.03 ng/mL  Troponin I  Result Value Ref Range   Troponin I <0.03 <0.03 ng/mL  Comprehensive metabolic panel  Result Value Ref Range   Sodium 141 135 - 145 mmol/L   Potassium 4.0 3.5 - 5.1 mmol/L    Chloride 110 101 - 111 mmol/L   CO2 27 22 - 32 mmol/L   Glucose, Bld 116 (H) 65 - 99 mg/dL   BUN 13 6 - 20 mg/dL   Creatinine, Ser 0.63 0.44 - 1.00 mg/dL   Calcium 8.4 (L) 8.9 - 10.3 mg/dL   Total Protein 6.3 (L) 6.5 -  8.1 g/dL   Albumin 3.4 (L) 3.5 - 5.0 g/dL   AST 22 15 - 41 U/L   ALT 17 14 - 54 U/L   Alkaline Phosphatase 60 38 - 126 U/L   Total Bilirubin 0.5 0.3 - 1.2 mg/dL   GFR calc non Af Amer >60 >60 mL/min   GFR calc Af Amer >60 >60 mL/min   Anion gap 4 (L) 5 - 15  CBC  Result Value Ref Range   WBC 7.4 3.6 - 11.0 K/uL   RBC 4.59 3.80 - 5.20 MIL/uL   Hemoglobin 13.8 12.0 - 16.0 g/dL   HCT 41.2 35.0 - 47.0 %   MCV 89.6 80.0 - 100.0 fL   MCH 30.1 26.0 - 34.0 pg   MCHC 33.5 32.0 - 36.0 g/dL   RDW 13.2 11.5 - 14.5 %   Platelets 198 150 - 440 K/uL  Lipase, blood  Result Value Ref Range   Lipase 218 (H) 11 - 51 U/L  Troponin I  Result Value Ref Range   Troponin I <0.03 <0.03 ng/mL  Troponin I  Result Value Ref Range   Troponin I <0.03 <0.03 ng/mL      Assessment & Plan:   Problem List Items Addressed This Visit    None    Visit Diagnoses    Left ear pain    -  Primary   Other infective acute otitis externa of left ear      Clinically consistent with L ear otitis externa with debris in ext ear canal, with edema, erythema, and infection  unable to view TM. No inciting injury or swimming. No prior ear infections.  - Concerns include potential Left AOM. Questionable systemic symptoms, also with sinusitis symptoms of concern - Not tender over mastoid  Plan: - Start Ciprodex otic antibiotic/steroid drops Left ear 4 drops BID x 7 days - Given sinusitis and some wheezing, unable to confirm entire L TM, will proceed with oral antibiotic coverage as well. Augmentin - Start Atrovent nasal spray decongestant 2 sprays in each nostril up to 4 times daily for 7 days, avoid oral decongestant to raise BP - Advised cannot rx opiates or tramadol today due to first visit, acute  only, she was already given short-term tramadol by ED 2 weeks ago, due to her limitations on meds AVOID NSAIDs intolerance / contraindicated. Allergy to Tylenol (Avoid), will rx refill Flexeril for now - Avoid water in ear, no Q-tips. - Follow-up 1 week ear re-check if not improve or worse pain consider ENT, scheduled for new patient eval at that time     Relevant Medications   ciprofloxacin-dexamethasone (CIPRODEX) OTIC suspension   amoxicillin-clavulanate (AUGMENTIN) 875-125 MG tablet      Meds ordered this encounter  Medications  . famotidine (PEPCID) 20 MG tablet    Sig: famotidine 20 mg tablet  . ciprofloxacin-dexamethasone (CIPRODEX) OTIC suspension    Sig: Place 4 drops into the left ear 2 (two) times daily.    Dispense:  7.5 mL    Refill:  0  . amoxicillin-clavulanate (AUGMENTIN) 875-125 MG tablet    Sig: Take 1 tablet by mouth 2 (two) times daily. For 7 days    Dispense:  14 tablet    Refill:  0  . cyclobenzaprine (FLEXERIL) 10 MG tablet    Sig: Take 1 tablet (10 mg total) by mouth 3 (three) times daily as needed for muscle spasms.    Dispense:  30 tablet    Refill:  0  .  ipratropium (ATROVENT) 0.06 % nasal spray    Sig: Place 2 sprays into both nostrils 4 (four) times daily. For up to 5-7 days then stop.    Dispense:  15 mL    Refill:  0    Follow up plan: Return in about 1 week (around 03/20/2017) for as scheduled for new patient, re-check L ear.   Nobie Putnam, [facility] 03/13/2017, 11:36 PM

## 2017-03-20 ENCOUNTER — Ambulatory Visit: Payer: Self-pay | Admitting: Family Medicine

## 2017-03-31 DIAGNOSIS — Z23 Encounter for immunization: Secondary | ICD-10-CM | POA: Diagnosis not present

## 2017-04-02 DIAGNOSIS — R197 Diarrhea, unspecified: Secondary | ICD-10-CM | POA: Diagnosis not present

## 2017-04-02 DIAGNOSIS — R112 Nausea with vomiting, unspecified: Secondary | ICD-10-CM | POA: Diagnosis not present

## 2017-04-03 ENCOUNTER — Emergency Department
Admission: EM | Admit: 2017-04-03 | Discharge: 2017-04-03 | Disposition: A | Discharge status: Home / Self Care | Payer: Medicare Other | Attending: Emergency Medicine | Admitting: Emergency Medicine

## 2017-04-03 ENCOUNTER — Encounter: Payer: Self-pay | Admitting: Emergency Medicine

## 2017-04-03 DIAGNOSIS — K529 Noninfective gastroenteritis and colitis, unspecified: Secondary | ICD-10-CM | POA: Diagnosis not present

## 2017-04-03 DIAGNOSIS — Z8541 Personal history of malignant neoplasm of cervix uteri: Secondary | ICD-10-CM | POA: Diagnosis not present

## 2017-04-03 DIAGNOSIS — I130 Hypertensive heart and chronic kidney disease with heart failure and stage 1 through stage 4 chronic kidney disease, or unspecified chronic kidney disease: Secondary | ICD-10-CM | POA: Insufficient documentation

## 2017-04-03 DIAGNOSIS — N189 Chronic kidney disease, unspecified: Secondary | ICD-10-CM | POA: Insufficient documentation

## 2017-04-03 DIAGNOSIS — I509 Heart failure, unspecified: Secondary | ICD-10-CM | POA: Diagnosis not present

## 2017-04-03 DIAGNOSIS — Z950 Presence of cardiac pacemaker: Secondary | ICD-10-CM | POA: Diagnosis not present

## 2017-04-03 DIAGNOSIS — F17210 Nicotine dependence, cigarettes, uncomplicated: Secondary | ICD-10-CM | POA: Insufficient documentation

## 2017-04-03 DIAGNOSIS — Z79899 Other long term (current) drug therapy: Secondary | ICD-10-CM | POA: Insufficient documentation

## 2017-04-03 DIAGNOSIS — I10 Essential (primary) hypertension: Secondary | ICD-10-CM | POA: Diagnosis not present

## 2017-04-03 DIAGNOSIS — R1013 Epigastric pain: Secondary | ICD-10-CM | POA: Diagnosis present

## 2017-04-03 LAB — COMPREHENSIVE METABOLIC PANEL
ALK PHOS: 68 U/L (ref 38–126)
ALT: 21 U/L (ref 14–54)
ANION GAP: 10 (ref 5–15)
AST: 29 U/L (ref 15–41)
Albumin: 3.6 g/dL (ref 3.5–5.0)
BILIRUBIN TOTAL: 0.5 mg/dL (ref 0.3–1.2)
BUN: 18 mg/dL (ref 6–20)
CALCIUM: 9 mg/dL (ref 8.9–10.3)
CO2: 22 mmol/L (ref 22–32)
Chloride: 109 mmol/L (ref 101–111)
Creatinine, Ser: 0.71 mg/dL (ref 0.44–1.00)
Glucose, Bld: 178 mg/dL — ABNORMAL HIGH (ref 65–99)
Potassium: 3.2 mmol/L — ABNORMAL LOW (ref 3.5–5.1)
SODIUM: 141 mmol/L (ref 135–145)
TOTAL PROTEIN: 6.5 g/dL (ref 6.5–8.1)

## 2017-04-03 LAB — CBC WITH DIFFERENTIAL/PLATELET
BASOS ABS: 0 10*3/uL (ref 0–0.1)
BASOS PCT: 1 %
EOS ABS: 0.2 10*3/uL (ref 0–0.7)
Eosinophils Relative: 3 %
HEMATOCRIT: 40.1 % (ref 35.0–47.0)
HEMOGLOBIN: 13.2 g/dL (ref 12.0–16.0)
Lymphocytes Relative: 34 %
Lymphs Abs: 2.1 10*3/uL (ref 1.0–3.6)
MCH: 28.8 pg (ref 26.0–34.0)
MCHC: 32.9 g/dL (ref 32.0–36.0)
MCV: 87.5 fL (ref 80.0–100.0)
MONO ABS: 0.7 10*3/uL (ref 0.2–0.9)
Monocytes Relative: 11 %
NEUTROS ABS: 3.1 10*3/uL (ref 1.4–6.5)
Neutrophils Relative %: 51 %
Platelets: 197 10*3/uL (ref 150–440)
RBC: 4.58 MIL/uL (ref 3.80–5.20)
RDW: 13.5 % (ref 11.5–14.5)
WBC: 6.1 10*3/uL (ref 3.6–11.0)

## 2017-04-03 LAB — URINALYSIS, COMPLETE (UACMP) WITH MICROSCOPIC
Bilirubin Urine: NEGATIVE
GLUCOSE, UA: NEGATIVE mg/dL
Hgb urine dipstick: NEGATIVE
Ketones, ur: NEGATIVE mg/dL
NITRITE: NEGATIVE
PH: 6 (ref 5.0–8.0)
Protein, ur: NEGATIVE mg/dL
Specific Gravity, Urine: 1.013 (ref 1.005–1.030)

## 2017-04-03 LAB — LIPASE, BLOOD: LIPASE: 39 U/L (ref 11–51)

## 2017-04-03 MED ORDER — ONDANSETRON HCL 4 MG/2ML IJ SOLN
4.0000 mg | Freq: Once | INTRAMUSCULAR | Status: AC
  Administered 2017-04-03: 4 mg via INTRAVENOUS
  Filled 2017-04-03: qty 2

## 2017-04-03 MED ORDER — CIPROFLOXACIN HCL 500 MG PO TABS: 500.0000 mg | ORAL_TABLET | Freq: Two times a day (BID) | ORAL | 0 refills | Status: DC

## 2017-04-03 MED ORDER — DICYCLOMINE HCL 10 MG PO CAPS
ORAL_CAPSULE | ORAL | Status: AC
  Filled 2017-04-03: qty 1

## 2017-04-03 MED ORDER — POTASSIUM CHLORIDE 20 MEQ PO PACK
40.0000 meq | PACK | Freq: Two times a day (BID) | ORAL | Status: DC
  Administered 2017-04-03: 40 meq via ORAL
  Filled 2017-04-03: qty 2

## 2017-04-03 MED ORDER — METRONIDAZOLE 500 MG PO TABS: 500.0000 mg | ORAL_TABLET | Freq: Two times a day (BID) | ORAL | 0 refills | Status: DC

## 2017-04-03 MED ORDER — DICYCLOMINE HCL 10 MG PO CAPS
10.0000 mg | ORAL_CAPSULE | Freq: Once | ORAL | Status: AC
  Administered 2017-04-03: 10 mg via ORAL

## 2017-04-03 MED ORDER — MORPHINE SULFATE (PF) 2 MG/ML IV SOLN
2.0000 mg | Freq: Once | INTRAVENOUS | Status: AC
  Administered 2017-04-03: 2 mg via INTRAVENOUS
  Filled 2017-04-03: qty 1

## 2017-04-03 MED ORDER — DICYCLOMINE HCL 10 MG PO CAPS: 10.0000 mg | ORAL_CAPSULE | Freq: Three times a day (TID) | ORAL | 0 refills | Status: DC

## 2017-04-03 NOTE — ED Provider Notes (Signed)
[facility] Emergency Department Provider Note   First MD Initiated Contact with Patient 04/03/17 0030     (approximate)  I have reviewed the triage vital signs and the nursing notes.   HISTORY  Chief Complaint Abdominal Pain; Emesis; and Diarrhea    HPI Lori [place] is a 66 y.o. female with below list of chronic medical conditions including pancreatitis presents to the emergency department withepigastric abdominal pain associated with vomiting which started yesterday. Patient states her current pain score is 10 out of 10. Patient denies any fever afebrile on presentation. Patient also admits to diarrhea. Patient denies any EtOH ingestion. Patient denies any urinary symptoms.   Past Medical History:  Diagnosis Date  . Bradycardia   . Cancer ([place])   . CHF (congestive heart failure) ([place])   . Chronic kidney disease   . Depression   . Hypertension   . Pancreatitis   . SSS (sick sinus syndrome) ([place])    PPM implant 03/08/11 - [employer] [phone] - SN [id number]    Patient Active Problem List   Diagnosis Date Noted  . Chronic pancreatitis ([place]) 02/25/2017  . Atypical chest pain 02/25/2017  . Abnormal EKG 02/25/2017  . Chest pain 02/25/2017  . Cardiac pacemaker in situ 03/29/2016  . History of malignant neoplasm of cervix 03/29/2016  . Hypertensive disorder 03/29/2016  . Kidney stone 03/29/2016  . Multiple nodules of lung 03/29/2016    Past Surgical History:  Procedure Laterality Date  . ABDOMINAL HYSTERECTOMY    . APPENDECTOMY    . PACEMAKER IMPLANT  03/08/2011   [employer] [id number] [phone]) - Implanted For SSS    Prior to Admission medications   Medication Sig Start Date End Date Taking? Authorizing Provider  amLODipine (NORVASC) 10 MG tablet Take 10 mg by mouth daily.    [provider]  amoxicillin-clavulanate (AUGMENTIN) 875-125 MG tablet Take 1 tablet by mouth 2 (two) times daily. For 7 days 03/13/17   Olin Hauser, DO  ciprofloxacin (CIPRO) 500 MG tablet Take 1 tablet (500 mg total) by mouth 2 (two) times daily. 04/03/17 04/13/17  Gregor Hams, MD  ciprofloxacin-dexamethasone (CIPRODEX) OTIC suspension Place 4 drops into the left ear 2 (two) times daily. 03/13/17   Karamalegos, Devonne Doughty, DO  cyclobenzaprine (FLEXERIL) 10 MG tablet Take 1 tablet (10 mg total) by mouth 3 (three) times daily as needed for muscle spasms. 03/13/17   Karamalegos, Devonne Doughty, DO  dicyclomine (BENTYL) 10 MG capsule Take 1 capsule (10 mg total) by mouth 4 (four) times daily -  before meals and at bedtime. 04/03/17 04/17/17  Gregor Hams, MD  famotidine (PEPCID) 20 MG tablet famotidine 20 mg tablet    [provider]  ipratropium (ATROVENT) 0.06 % nasal spray Place 2 sprays into both nostrils 4 (four) times daily. For up to 5-7 days then stop. 03/13/17   Karamalegos, Devonne Doughty, DO  lisinopril (PRINIVIL,ZESTRIL) 40 MG tablet Take 40 mg by mouth daily.    [provider]  metoprolol (TOPROL-XL) 200 MG 24 hr tablet Take 200 mg by mouth daily.    [provider]  metroNIDAZOLE (FLAGYL) 500 MG tablet Take 1 tablet (500 mg total) by mouth 2 (two) times daily. 04/03/17 04/13/17  Gregor Hams, MD  promethazine (PHENERGAN) 12.5 MG tablet Take 1 tablet (12.5 mg total) by mouth every 6 (six) hours as needed for nausea or vomiting. 01/27/17   Merlyn Lot, MD    Allergies Sulfa antibiotics  and Acetaminophen  Family History  Problem Relation Age of Onset  . Dementia Mother   . Heart attack Father        Died age 80  . Heart disease Sister        Congential  . Heart disease Sister        "Some kind of heart surgery."    Social History Social History  Substance Use Topics  . Smoking status: Current Every Day Smoker    Packs/day: 0.50    Types: Cigarettes  . Smokeless tobacco: Never Used  . Alcohol use No    Review of Systems Constitutional: No fever/chills Eyes: No visual  changes. ENT: No sore throat. Cardiovascular: Denies chest pain. Respiratory: Denies shortness of breath. Gastrointestinal: positive for abdominal pain and vomiting Genitourinary: Negative for dysuria. Musculoskeletal: Negative for neck pain.  Negative for back pain. Integumentary: Negative for rash. Neurological: Negative for headaches, focal weakness or numbness.   ____________________________________________   PHYSICAL EXAM:  VITAL SIGNS: ED Triage Vitals  Enc Vitals Group     BP 04/03/17 0037 (!) 175/75     Pulse Rate 04/03/17 0037 63     Resp 04/03/17 0037 18     Temp 04/03/17 0037 98 F (36.7 C)     Temp Source 04/03/17 0037 Oral     SpO2 04/03/17 0037 98 %     Weight 04/03/17 0038 53.1 kg (117 lb)     Height 04/03/17 0038 1.575 m (5\' 2" )     Head Circumference --      Peak Flow --      Pain Score 04/03/17 0037 8     Pain Loc --      Pain Edu? --      Excl. in [place]? --     Constitutional: Alert and oriented. apparent discomfort Eyes: Conjunctivae are normal.  Head: Atraumatic. Mouth/Throat: Mucous membranes are moist.  Oropharynx non-erythematous. Neck: No stridor.  Cardiovascular: Normal rate, regular rhythm. Good peripheral circulation. Grossly normal heart sounds. Respiratory: Normal respiratory effort.  No retractions. Lungs CTAB. Gastrointestinal:epigastric tenderness palpation. No distention.  Musculoskeletal: No lower extremity tenderness nor edema. No gross deformities of extremities. Neurologic:  Normal speech and language. No gross focal neurologic deficits are appreciated.  Skin:  Skin is warm, dry and intact. No rash noted. Psychiatric: Mood and affect are normal. Speech and behavior are normal.  ____________________________________________   LABS (all labs ordered are listed, but only abnormal results are displayed)  Labs Reviewed  COMPREHENSIVE METABOLIC PANEL - Abnormal; Notable for the following:       Result Value   Potassium 3.2 (*)     Glucose, Bld 178 (*)    All other components within normal limits  URINALYSIS, COMPLETE (UACMP) WITH MICROSCOPIC - Abnormal; Notable for the following:    Color, Urine YELLOW (*)    APPearance CLEAR (*)    Leukocytes, UA SMALL (*)    Bacteria, UA RARE (*)    Squamous Epithelial / LPF 0-5 (*)    All other components within normal limits  CBC WITH DIFFERENTIAL/PLATELET  LIPASE, BLOOD   ____________________________________________  EKG  ED ECG REPORT I, [place] Mieka Leaton, the attending physician, personally viewed and interpreted this ECG.   Date: 04/04/2017  EKG Time: 12:44 AM  Rate: 58  Rhythm: normal sinus rhythm  Axis: normal  Intervals:normal  ST&T Change: none    Procedures   ____________________________________________   INITIAL IMPRESSION / ASSESSMENT AND PLAN / ED COURSE  As part  of my medical decision making, I reviewed the following data within the electronic MEDICAL RECORD NUMBER 66 year old female presenting with above stated history and physical exam of epigastric abdominal pain and vomiting. concern for possible pancreatitis versus gastroenteritis or other intra-abdominal pathologyLaboratory data unremarkable including lipase. Patient received IV Zofran emergency department with no further vomiting while in ED. Review the patient's chart revealed a normal CT scan performed in August and September of this yearfor similar complaint and a such further CT imaging not performed today    ____________________________________________  FINAL CLINICAL IMPRESSION(S) / ED DIAGNOSES  Final diagnoses:  Colitis     MEDICATIONS GIVEN DURING THIS VISIT:  Medications  morphine 2 MG/ML injection 2 mg (2 mg Intravenous Given 04/03/17 0055)  ondansetron (ZOFRAN) injection 4 mg (4 mg Intravenous Given 04/03/17 0055)  dicyclomine (BENTYL) capsule 10 mg (10 mg Oral Given 04/03/17 0334)     NEW OUTPATIENT MEDICATIONS STARTED DURING THIS VISIT:  Discharge Medication List as of  04/03/2017  3:41 AM    START taking these medications   Details  ciprofloxacin (CIPRO) 500 MG tablet Take 1 tablet (500 mg total) by mouth 2 (two) times daily., Starting Mon 04/03/2017, Until Thu 04/13/2017, Print    dicyclomine (BENTYL) 10 MG capsule Take 1 capsule (10 mg total) by mouth 4 (four) times daily -  before meals and at bedtime., Starting Mon 04/03/2017, Until Mon 04/17/2017, Print    metroNIDAZOLE (FLAGYL) 500 MG tablet Take 1 tablet (500 mg total) by mouth 2 (two) times daily., Starting Mon 04/03/2017, Until Thu 04/13/2017, Print        Discharge Medication List as of 04/03/2017  3:41 AM      Discharge Medication List as of 04/03/2017  3:41 AM       Note:  This document was prepared using Dragon voice recognition software and may include unintentional dictation errors.    Gregor Hams, MD 04/04/17 [phone]

## 2017-04-03 NOTE — ED Triage Notes (Signed)
Patient brought in by ems from home. Patient with complaint of abdominal pain and vomiting that started yesterday and diarrhea that started today.

## 2017-04-09 ENCOUNTER — Emergency Department: Payer: Medicare Other

## 2017-04-09 ENCOUNTER — Emergency Department
Admission: EM | Admit: 2017-04-09 | Discharge: 2017-04-09 | Disposition: A | Discharge status: Home / Self Care | Payer: Medicare Other | Attending: Emergency Medicine | Admitting: Emergency Medicine

## 2017-04-09 DIAGNOSIS — Z79899 Other long term (current) drug therapy: Secondary | ICD-10-CM | POA: Insufficient documentation

## 2017-04-09 DIAGNOSIS — I130 Hypertensive heart and chronic kidney disease with heart failure and stage 1 through stage 4 chronic kidney disease, or unspecified chronic kidney disease: Secondary | ICD-10-CM | POA: Diagnosis not present

## 2017-04-09 DIAGNOSIS — R1013 Epigastric pain: Secondary | ICD-10-CM | POA: Diagnosis not present

## 2017-04-09 DIAGNOSIS — R1012 Left upper quadrant pain: Secondary | ICD-10-CM | POA: Insufficient documentation

## 2017-04-09 DIAGNOSIS — I509 Heart failure, unspecified: Secondary | ICD-10-CM | POA: Insufficient documentation

## 2017-04-09 DIAGNOSIS — R079 Chest pain, unspecified: Secondary | ICD-10-CM | POA: Diagnosis not present

## 2017-04-09 DIAGNOSIS — F17210 Nicotine dependence, cigarettes, uncomplicated: Secondary | ICD-10-CM | POA: Insufficient documentation

## 2017-04-09 DIAGNOSIS — R0602 Shortness of breath: Secondary | ICD-10-CM | POA: Diagnosis not present

## 2017-04-09 DIAGNOSIS — Z950 Presence of cardiac pacemaker: Secondary | ICD-10-CM | POA: Diagnosis not present

## 2017-04-09 DIAGNOSIS — N189 Chronic kidney disease, unspecified: Secondary | ICD-10-CM | POA: Diagnosis not present

## 2017-04-09 LAB — BASIC METABOLIC PANEL
ANION GAP: 6 (ref 5–15)
BUN: 15 mg/dL (ref 6–20)
CHLORIDE: 111 mmol/L (ref 101–111)
CO2: 26 mmol/L (ref 22–32)
Calcium: 9.3 mg/dL (ref 8.9–10.3)
Creatinine, Ser: 0.86 mg/dL (ref 0.44–1.00)
GFR calc Af Amer: >60 mL/min (ref >60)
GLUCOSE: 106 mg/dL — AB (ref 65–99)
POTASSIUM: 3.9 mmol/L (ref 3.5–5.1)
Sodium: 143 mmol/L (ref 135–145)

## 2017-04-09 LAB — CBC
HEMATOCRIT: 42 % (ref 35.0–47.0)
HEMOGLOBIN: 13.8 g/dL (ref 12.0–16.0)
MCH: 29.2 pg (ref 26.0–34.0)
MCHC: 33 g/dL (ref 32.0–36.0)
MCV: 88.6 fL (ref 80.0–100.0)
Platelets: 218 10*3/uL (ref 150–440)
RBC: 4.74 MIL/uL (ref 3.80–5.20)
RDW: 14 % (ref 11.5–14.5)
WBC: 8.5 10*3/uL (ref 3.6–11.0)

## 2017-04-09 LAB — LIPASE, BLOOD: LIPASE: 164 U/L — AB (ref 11–51)

## 2017-04-09 LAB — TROPONIN I: Troponin I: <0.03 ng/mL (ref <0.03)

## 2017-04-09 MED ORDER — DICYCLOMINE HCL 10 MG/ML IM SOLN
20.0000 mg | Freq: Once | INTRAMUSCULAR | Status: AC
  Administered 2017-04-09: 20 mg via INTRAMUSCULAR
  Filled 2017-04-09: qty 2

## 2017-04-09 MED ORDER — ONDANSETRON 4 MG PO TBDP
4.0000 mg | ORAL_TABLET | Freq: Once | ORAL | Status: AC
  Administered 2017-04-09: 4 mg via ORAL
  Filled 2017-04-09: qty 1

## 2017-04-09 MED ORDER — GI COCKTAIL ~~LOC~~
30.0000 mL | Freq: Once | ORAL | Status: DC
  Filled 2017-04-09: qty 30

## 2017-04-09 NOTE — ED Notes (Signed)
Pt to room 2 with hx of pancreatitis - c/o vomiting prior to arrival. Has appt with cardiologist tomorrow - lab work back and enzymes normal.

## 2017-04-09 NOTE — ED Triage Notes (Signed)
Pt came to ED via pov c/o chest pain and abdominal pain w/ n/v. Reports history of pancreatitis. Reports cardiology appointment tomorrow. VS stable.

## 2017-04-09 NOTE — ED Notes (Signed)
Pt refused her gi cocktail from [place] - got dressed and came out yelling and screaming that she was leaving and wants her discharge paperwork.

## 2017-04-09 NOTE — Discharge Instructions (Signed)
Please seek medical attention for any high fevers, chest pain, shortness of breath, change in behavior, persistent vomiting, bloody stool or any other new or concerning symptoms.  

## 2017-04-09 NOTE — ED Provider Notes (Signed)
[facility] Emergency Department Provider Note  ____________________________________________   I have reviewed the triage vital signs and the nursing notes.   HISTORY  Chief Complaint Abdominal pain  History limited by: Not Limited   HPI Lori Orr is a 65 y.o. female who presents to the emergency department today because of abdominal pain.  LOCATION:epigastric left upper quadrant DURATION:started yesterday TIMING: constant SEVERITY: severe QUALITY: sharp CONTEXT: patient states she has a history of chronic pancreatitis. Feels like the pain is the same as her normal pancreatitis pain. Denies any alcohol use. MODIFYING FACTORS: none ASSOCIATED SYMPTOMS: has had associated nausea. Denies fevers  Per medical record review patient has a history of recent hospitalization and multiple emergency department visits for abdominal/chest pain.  Past Medical History:  Diagnosis Date  . Bradycardia   . Cancer ([place])   . CHF (congestive heart failure) ([place])   . Chronic kidney disease   . Depression   . Hypertension   . Pancreatitis   . SSS (sick sinus syndrome) ([place])    PPM implant 03/08/11 - [employer] [phone] - SN [phone]    Patient Active Problem List   Diagnosis Date Noted  . Chronic pancreatitis ([place]) 02/25/2017  . Atypical chest pain 02/25/2017  . Abnormal EKG 02/25/2017  . Chest pain 02/25/2017  . Cardiac pacemaker in situ 03/29/2016  . History of malignant neoplasm of cervix 03/29/2016  . Hypertensive disorder 03/29/2016  . Kidney stone 03/29/2016  . Multiple nodules of lung 03/29/2016    Past Surgical History:  Procedure Laterality Date  . ABDOMINAL HYSTERECTOMY    . APPENDECTOMY    . PACEMAKER IMPLANT  03/08/2011   [employer] [id number] [phone]) - Implanted For SSS    Prior to Admission medications   Medication Sig Start Date End Date Taking? Authorizing Provider  amLODipine (NORVASC) 10 MG tablet Take 10 mg by mouth daily.     [provider]  amoxicillin-clavulanate (AUGMENTIN) 875-125 MG tablet Take 1 tablet by mouth 2 (two) times daily. For 7 days 03/13/17   Olin Hauser, DO  ciprofloxacin (CIPRO) 500 MG tablet Take 1 tablet (500 mg total) by mouth 2 (two) times daily. 04/03/17 04/13/17  Gregor Hams, MD  ciprofloxacin-dexamethasone (CIPRODEX) OTIC suspension Place 4 drops into the left ear 2 (two) times daily. 03/13/17   Karamalegos, Devonne Doughty, DO  cyclobenzaprine (FLEXERIL) 10 MG tablet Take 1 tablet (10 mg total) by mouth 3 (three) times daily as needed for muscle spasms. 03/13/17   Karamalegos, Devonne Doughty, DO  dicyclomine (BENTYL) 10 MG capsule Take 1 capsule (10 mg total) by mouth 4 (four) times daily -  before meals and at bedtime. 04/03/17 04/17/17  Gregor Hams, MD  famotidine (PEPCID) 20 MG tablet famotidine 20 mg tablet    [provider]  ipratropium (ATROVENT) 0.06 % nasal spray Place 2 sprays into both nostrils 4 (four) times daily. For up to 5-7 days then stop. 03/13/17   Karamalegos, Devonne Doughty, DO  lisinopril (PRINIVIL,ZESTRIL) 40 MG tablet Take 40 mg by mouth daily.    [provider]  metoprolol (TOPROL-XL) 200 MG 24 hr tablet Take 200 mg by mouth daily.    [provider]  metroNIDAZOLE (FLAGYL) 500 MG tablet Take 1 tablet (500 mg total) by mouth 2 (two) times daily. 04/03/17 04/13/17  Gregor Hams, MD  promethazine (PHENERGAN) 12.5 MG tablet Take 1 tablet (12.5 mg total) by mouth every 6 (six) hours as needed for nausea or  vomiting. 01/27/17   Merlyn Lot, MD    Allergies Sulfa antibiotics and Acetaminophen  Family History  Problem Relation Age of Onset  . Dementia Mother   . Heart attack Father        Died age 41  . Heart disease Sister        Congential  . Heart disease Sister        "Some kind of heart surgery."    Social History Social History  Substance Use Topics  . Smoking status: Current Every Day Smoker     Packs/day: 0.50    Types: Cigarettes  . Smokeless tobacco: Never Used  . Alcohol use No    Review of Systems Constitutional: No fever/chills Eyes: No visual changes. ENT: No sore throat. Cardiovascular: Denies chest pain. Respiratory: Denies shortness of breath. Gastrointestinal: Positive for epigastric abdominal pain. Positive for nausea.  Genitourinary: Negative for dysuria. Musculoskeletal: Negative for back pain. Skin: Negative for rash. Neurological: Negative for headaches, focal weakness or numbness.  ____________________________________________   PHYSICAL EXAM:  VITAL SIGNS: ED Triage Vitals  Enc Vitals Group     BP 04/09/17 1330 (!) 165/99     Pulse Rate 04/09/17 1330 64     Resp 04/09/17 1330 16     Temp 04/09/17 1330 97.7 F (36.5 C)     Temp Source 04/09/17 1330 Oral     SpO2 04/09/17 1330 97 %     Weight 04/09/17 1331 117 lb (53.1 kg)     Height 04/09/17 1331 5\' 2"  (1.575 m)   Constitutional: Alert and oriented. Well appearing and in no distress. Eyes: Conjunctivae are normal.  ENT   Head: Normocephalic and atraumatic.   Nose: No congestion/rhinnorhea.   Mouth/Throat: Mucous membranes are moist.   Neck: No stridor. Hematological/Lymphatic/Immunilogical: No cervical lymphadenopathy. Cardiovascular: Normal rate, regular rhythm.  No murmurs, rubs, or gallops.  Respiratory: Normal respiratory effort without tachypnea nor retractions. Breath sounds are clear and equal bilaterally. No wheezes/rales/rhonchi. Gastrointestinal: Soft and minimally tender to palpation over the epigastric region and left side of the abdomen. No rebound. No guarding.  Genitourinary: Deferred Musculoskeletal: Normal range of motion in all extremities. No lower extremity edema. Neurologic:  Normal speech and language. No gross focal neurologic deficits are appreciated.  Skin:  Skin is warm, dry and intact. No rash noted. Psychiatric: Mood and affect are normal. Speech and  behavior are normal. Patient exhibits appropriate insight and judgment.  ____________________________________________    LABS (pertinent positives/negatives)  Lipase 164 Trop <0.03 CBC wnl BMP wnl except for glu 106  ____________________________________________   EKG  I, Nance Pear, attending physician, personally viewed and interpreted this EKG  EKG Time: 1331 Rate: 60 Rhythm: atrial paced rhythm Axis: rightward axis Intervals: qtc 414 QRS: narrow, q waves V1, V2 ST changes: no st elevation Impression: abnormal ekg  ____________________________________________    RADIOLOGY  CXR No acute disease  ____________________________________________   PROCEDURES  Procedures  ____________________________________________   INITIAL IMPRESSION / ASSESSMENT AND PLAN / ED COURSE  Pertinent labs & imaging results that were available during my care of the patient were reviewed by me and considered in my medical decision making (see chart for details).  Patient presented to the emergency department today with concerns for epigastric pain.  She states she has a history of chronic pancreatitis. Other considerations would be infection, gastritis, esophagitis amongst other etiologies. Patient has been in the emergency department for similar complaints over the past few months. Lipase was minimally elevated, however  per discharge note from previous admission it was also elevated when she was feeling better. Patient states that normally she gets a protocol for her pain and is discharged with narcotics. I chose to trial non narcotic pain medication initially. After bentyl patient did request discharge. She does have follow up scheduled for tomorrow.    ____________________________________________   FINAL CLINICAL IMPRESSION(S) / ED DIAGNOSES  Final diagnoses:  Epigastric pain     Note: This dictation was prepared with Dragon dictation. Any transcriptional errors that  result from this process are unintentional     Nance Pear, MD 04/09/17 [id number]

## 2017-04-09 NOTE — ED Notes (Signed)
Pt yelling at this RN, refusing to take the GI cocktail, very angry with her care.

## 2017-04-10 ENCOUNTER — Ambulatory Visit (INDEPENDENT_AMBULATORY_CARE_PROVIDER_SITE_OTHER): Payer: Medicare Other | Admitting: Internal Medicine

## 2017-04-10 ENCOUNTER — Encounter: Payer: Self-pay | Admitting: Internal Medicine

## 2017-04-10 VITALS — BP 142/94 | HR 59 | Ht 62.0 in | Wt 121.4 lb

## 2017-04-10 DIAGNOSIS — Z950 Presence of cardiac pacemaker: Secondary | ICD-10-CM | POA: Diagnosis not present

## 2017-04-10 DIAGNOSIS — I509 Heart failure, unspecified: Secondary | ICD-10-CM | POA: Diagnosis not present

## 2017-04-10 DIAGNOSIS — R0609 Other forms of dyspnea: Secondary | ICD-10-CM | POA: Diagnosis not present

## 2017-04-10 DIAGNOSIS — I495 Sick sinus syndrome: Secondary | ICD-10-CM

## 2017-04-10 DIAGNOSIS — R0600 Dyspnea, unspecified: Secondary | ICD-10-CM

## 2017-04-10 LAB — CUP PACEART INCLINIC DEVICE CHECK
Brady Statistic RA Percent Paced: 85 %
Date Time Interrogation Session: 20181029040000
Implantable Lead Implant Date: 20120925
Implantable Lead Implant Date: 20120925
Implantable Lead Model: 4135
Implantable Lead Model: 4136
Implantable Lead Serial Number: 28984048
Implantable Pulse Generator Implant Date: 20120925
Lead Channel Impedance Value: 490 Ohm
Lead Channel Impedance Value: 550 Ohm
Lead Channel Pacing Threshold Amplitude: 0.5 V
Lead Channel Pacing Threshold Amplitude: 1.1 V
Lead Channel Pacing Threshold Pulse Width: 0.4 ms
Lead Channel Sensing Intrinsic Amplitude: 9.5 mV
Lead Channel Setting Pacing Amplitude: 2.4 V
Lead Channel Setting Sensing Sensitivity: 2.5 mV
MDC IDC LEAD LOCATION: 753859
MDC IDC LEAD LOCATION: 753860
MDC IDC LEAD SERIAL: 29041902
MDC IDC MSMT LEADCHNL RA SENSING INTR AMPL: 2.4 mV
MDC IDC MSMT LEADCHNL RV PACING THRESHOLD PULSEWIDTH: 0.4 ms
MDC IDC SET LEADCHNL RA PACING AMPLITUDE: 2 V
MDC IDC SET LEADCHNL RV PACING PULSEWIDTH: 0.4 ms
MDC IDC STAT BRADY RV PERCENT PACED: 3 %
Pulse Gen Serial Number: 189779

## 2017-04-10 MED ORDER — LISINOPRIL 40 MG PO TABS: 40.0000 mg | ORAL_TABLET | Freq: Every day | ORAL | 3 refills | Status: DC

## 2017-04-10 MED ORDER — METOPROLOL SUCCINATE ER 100 MG PO TB24: 200.0000 mg | ORAL_TABLET | Freq: Every day | ORAL | 3 refills | Status: DC

## 2017-04-10 MED ORDER — METOPROLOL SUCCINATE ER 100 MG PO TB24: 100.0000 mg | ORAL_TABLET | Freq: Every day | ORAL | 3 refills | Status: DC

## 2017-04-10 NOTE — Progress Notes (Signed)
ELECTROPHYSIOLOGY OFFICE  NOTE  Patient ID: Elowyn Raupp, MRN: [id number], DOB/AGE: [birth year] 66 y.o. Admit date: (Not on file) Date of Consult: 04/10/2017  Primary Physician: Olin Hauser, DO Primary Cardiologist: Hortensia Duffin Finch is a 66 y.o. female who is being seen today for the evaluation of Pacemaker function   HPI Anthea Udovich is a 66 y.o. female to establish pacemaker followup  2012 presented w Syncope.  Seen at [facility].  Eval incl Echo LVEF normal with concentric LVH and cavity obliteration;  Tilt table normal.  Myoview-Lexi was normal  She was found to have bradycardia arrhythmias.  Pacing eliminated all syncope.  She has poorly controlled hypertension. She has been on a complex medical regime including high-dose metoprolol, amlodipine and lisinopril. She has run out of her medications.  She has intermittent chest pains.  She was seen 9/18 at [facility] by Dr. Lenore Cordia. She had nonspecific T-wave changes. Borderline troponins. Not felt to be ACS. Her chest pain story is inconsistent with cardiac disease with episodes lasting hours and hours.  She has chronic short dyspnea on exertion;  chest x-ray was reviewed today personally. There is hyperaeration consistent with COPD  Past Medical History:  Diagnosis Date  . Bradycardia   . Cancer ([place])   . CHF (congestive heart failure) ([place])   . Chronic kidney disease   . Depression   . Hypertension   . Pancreatitis   . SSS (sick sinus syndrome) ([place])    PPM implant 03/08/11 - [facility] - SN [phone]      Surgical History:  Past Surgical History:  Procedure Laterality Date  . ABDOMINAL HYSTERECTOMY    . APPENDECTOMY    . PACEMAKER IMPLANT  03/08/2011   [employer] [id number] [phone]) - Implanted For SSS     Home Meds: Prior to Admission medications   Medication Sig Start Date End Date Taking? Authorizing Provider  amLODipine (NORVASC) 10 MG tablet Take 10 mg by mouth daily.    [provider]  amoxicillin-clavulanate (AUGMENTIN) 875-125 MG tablet Take 1 tablet by mouth 2 (two) times daily. For 7 days Patient not taking: Reported on 04/09/2017 03/13/17   Olin Hauser, DO  ciprofloxacin (CIPRO) 500 MG tablet Take 1 tablet (500 mg total) by mouth 2 (two) times daily. Patient not taking: Reported on 04/09/2017 04/03/17 04/13/17  Gregor Hams, MD  ciprofloxacin-dexamethasone [facility]) OTIC suspension Place 4 drops into the left ear 2 (two) times daily. 03/13/17   Karamalegos, Devonne Doughty, DO  cyclobenzaprine (FLEXERIL) 10 MG tablet Take 1 tablet (10 mg total) by mouth 3 (three) times daily as needed for muscle spasms. Patient not taking: Reported on 04/09/2017 03/13/17   Olin Hauser, DO  dicyclomine (BENTYL) 10 MG capsule Take 1 capsule (10 mg total) by mouth 4 (four) times daily -  before meals and at bedtime. Patient not taking: Reported on 04/09/2017 04/03/17 04/17/17  Gregor Hams, MD  ipratropium (ATROVENT) 0.06 % nasal spray Place 2 sprays into both nostrils 4 (four) times daily. For up to 5-7 days then stop. Patient not taking: Reported on 04/09/2017 03/13/17   Olin Hauser, DO  lisinopril (PRINIVIL,ZESTRIL) 40 MG tablet Take 40 mg by mouth daily.    [provider]  metoprolol (TOPROL-XL) 200 MG 24 hr tablet Take 200 mg by mouth daily.    [provider]  metroNIDAZOLE (FLAGYL) 500 MG tablet Take 1 tablet (500 mg total) by  mouth 2 (two) times daily. Patient not taking: Reported on 04/09/2017 04/03/17 04/13/17  Gregor Hams, MD  promethazine (PHENERGAN) 12.5 MG tablet Take 1 tablet (12.5 mg total) by mouth every 6 (six) hours as needed for nausea or vomiting. 01/27/17   Merlyn Lot, MD    Allergies:  Allergies  Allergen Reactions  . Sulfa Antibiotics Rash  . Acetaminophen     Social History   Social History  . Marital status: Divorced    Spouse name: N/A  . Number of children: N/A  .  Years of education: N/A   Occupational History  . Not on file.   Social History Main Topics  . Smoking status: Current Every Day Smoker    Packs/day: 0.50    Types: Cigarettes  . Smokeless tobacco: Never Used  . Alcohol use No  . Drug use: No  . Sexual activity: Not on file   Other Topics Concern  . Not on file   Social History Narrative   Moving in with her daughter.      Family History  Problem Relation Age of Onset  . Dementia Mother   . Heart attack Father        Died age 21  . Heart disease Sister        Congential  . Heart disease Sister        "Some kind of heart surgery."     ROS:  Please see the history of present illness.     All other systems reviewed and negative.    Physical Exam  Blood pressure (!) 142/94, pulse (!) 59, height 5\' 2"  (1.575 m), weight 121 lb 6.4 oz (55.1 kg). General: Well developed, athenic makeup (good female in no acute distress. Head: Normocephalic, atraumatic, sclera non-icteric, no xanthomas, nares are without discharge. EENT: normal  Lymph Nodes:  none Neck: Negative for carotid bruits. JVD not elevated. Back:without scoliosis kyphosis  Lungs: Clear bilaterally to auscultation without wheezes, rales, or rhonchi. Breathing is unlabored. Heart: RRR with S1 S2. 2/6 systolic murmur . No rubs, or gallops appreciated. Abdomen: Soft, non-tender, non-distended with normoactive bowel sounds. No hepatomegaly. No rebound/guarding. No obvious abdominal masses. Msk:  Strength and tone appear normal for age. Extremities: No clubbing or cyanosis. No  edema.  Distal pedal pulses are 2+ and equal bilaterally. Skin: Warm and Dry Neuro: Alert and oriented X 3. CN III-XII intact Grossly normal sensory and motor function . Psych:  Responds to questions appropriately with a normal affect.      Labs: Cardiac Enzymes  Recent Labs  04/09/17 1339  TROPONINI <0.03   CBC Lab Results  Component Value Date   WBC 8.5 04/09/2017   HGB 13.8  04/09/2017   HCT 42.0 04/09/2017   MCV 88.6 04/09/2017   PLT 218 04/09/2017   PROTIME: No results for input(s): LABPROT, INR in the last 72 hours. Chemistry   Recent Labs Lab 04/09/17 1339  NA 143  K 3.9  CL 111  CO2 26  BUN 15  CREATININE 0.86  CALCIUM 9.3  GLUCOSE 106*   Lipids No results found for: CHOL, HDL, LDLCALC, TRIG BNP No results found for: PROBNP Thyroid Function Tests: No results for input(s): TSH, T4TOTAL, T3FREE, THYROIDAB in the last 72 hours.  Invalid input(s): FREET3 Miscellaneous No results found for: DDIMER  Radiology/Studies:  Dg Chest 2 View  Result Date: 04/09/2017 CLINICAL DATA:  Chest pain and shortness breath beginning yesterday. EXAM: CHEST  2 VIEW COMPARISON:  01/27/2017 FINDINGS: The  heart size and mediastinal contours are within normal limits. Dual lead transvenous pacemaker remains in appropriate position. Aortic atherosclerosis. Both lungs are clear. The visualized skeletal structures are unremarkable. IMPRESSION: No active cardiopulmonary disease. Electronically Signed   By: Earle Gell M.D.   On: 04/09/2017 14:55    ECG  Sinus at 59 17/08/43 As sessment and Plan:  Syncope  Pacemaker-[employer]  Hypertension  Dyspnea on exertion  COPD  Chest pain  Atrial fibrillation-subclinical  Patient has chronic dyspnea on exertion. Chest x-ray is consistent with emphysema. We will also undertake an echocardiogram as it has been some years. I was impressed that her echocardiogram report 2012 described cavity obliteration. Her beta blockers may benefit this extensively as might changing her amlodipine to a different type of calcium blocker we will hold off on refilling her amlodipine until we get the echo back.  Device function is normal. No interval syncope  Intermittent chest pain; almost certainly not cardiac.  We will keep track of her SCAF burden; at this point accident duration is under 30 minutes.   Virl Axe

## 2017-04-10 NOTE — Patient Instructions (Addendum)
Medication Instructions: Your physician has recommended you make the following change in your medication: - 1) DECREASE Metoprolol 100 mg - Take 1 tablet (100 mg) by mouth daily - NEW RX sent to Pharmacy  Labwork: None Ordered  Procedures/Testing: Your physician has requested that you have an echocardiogram. Echocardiography is a painless test that uses sound waves to create images of your heart. It provides your doctor with information about the size and shape of your heart and how well your heart's chambers and valves are working. This procedure takes approximately one hour. There are no restrictions for this procedure.  Follow-Up: Your physician wants you to follow-up in: Sept 2019. You will receive a reminder letter in the mail two months in advance. If you don't receive a letter, please call our office to schedule the follow-up appointment.  Your physician wants you to follow-up in: 6 MONTHS with the [facility].  You will receive a reminder letter in the mail two months in advance. If you don't receive a letter, please call our office to schedule the follow-up appointment.  If you need a refill on your cardiac medications before your next appointment, please call your pharmacy.

## 2017-04-17 ENCOUNTER — Ambulatory Visit
Admission: RE | Admit: 2017-04-17 | Discharge: 2017-04-17 | Disposition: A | Discharge status: Home / Self Care | Payer: Medicare Other | Source: Ambulatory Visit | Attending: Nurse Practitioner | Admitting: Nurse Practitioner

## 2017-04-17 ENCOUNTER — Ambulatory Visit (INDEPENDENT_AMBULATORY_CARE_PROVIDER_SITE_OTHER): Payer: Medicare Other | Admitting: Nurse Practitioner

## 2017-04-17 ENCOUNTER — Encounter: Payer: Self-pay | Admitting: Nurse Practitioner

## 2017-04-17 VITALS — BP 177/104 | HR 59 | Temp 98.3°F | Ht 62.0 in | Wt 122.8 lb

## 2017-04-17 DIAGNOSIS — K861 Other chronic pancreatitis: Secondary | ICD-10-CM | POA: Diagnosis not present

## 2017-04-17 DIAGNOSIS — Z716 Tobacco abuse counseling: Secondary | ICD-10-CM | POA: Diagnosis not present

## 2017-04-17 DIAGNOSIS — M19032 Primary osteoarthritis, left wrist: Secondary | ICD-10-CM | POA: Insufficient documentation

## 2017-04-17 DIAGNOSIS — F3162 Bipolar disorder, current episode mixed, moderate: Secondary | ICD-10-CM | POA: Insufficient documentation

## 2017-04-17 DIAGNOSIS — K859 Acute pancreatitis without necrosis or infection, unspecified: Secondary | ICD-10-CM | POA: Insufficient documentation

## 2017-04-17 DIAGNOSIS — M25532 Pain in left wrist: Secondary | ICD-10-CM | POA: Diagnosis not present

## 2017-04-17 DIAGNOSIS — F17200 Nicotine dependence, unspecified, uncomplicated: Secondary | ICD-10-CM | POA: Diagnosis not present

## 2017-04-17 DIAGNOSIS — I10 Essential (primary) hypertension: Secondary | ICD-10-CM

## 2017-04-17 MED ORDER — NICOTINE 14 MG/24HR TD PT24: 14.0000 mg | MEDICATED_PATCH | Freq: Every day | TRANSDERMAL | 0 refills | Status: AC

## 2017-04-17 MED ORDER — AMLODIPINE BESYLATE 10 MG PO TABS: 10.0000 mg | ORAL_TABLET | Freq: Every day | ORAL | 1 refills | Status: DC

## 2017-04-17 MED ORDER — BUPROPION HCL ER (XL) 150 MG PO TB24: 150.0000 mg | ORAL_TABLET | Freq: Every day | ORAL | 2 refills | Status: DC

## 2017-04-17 MED ORDER — TRAMADOL HCL 50 MG PO TABS: 50.0000 mg | ORAL_TABLET | Freq: Every day | ORAL | 0 refills | Status: DC | PRN

## 2017-04-17 MED ORDER — NICOTINE 7 MG/24HR TD PT24: 7.0000 mg | MEDICATED_PATCH | Freq: Every day | TRANSDERMAL | 0 refills | Status: AC

## 2017-04-17 NOTE — Assessment & Plan Note (Addendum)
Pt w/ long history of recurrent pancreatitis.  Exact onset unknown.  Most recently, pt has been managing pain w/ Tramadol prn.  Pt states is not taking this daily.  Pt denies all alcohol use.  Positive epigastric and LUQ pain today on exam.  Plan: 1. Pt requests GI referral. Referral placed. 2. Pt requests refill Tramadol.  Discussed need to use method that will address pancreatitis more directly.  Offered Creon as alternative to pain management.  Pt refuses until sees GI.   - Refill for tramadol 50 mg provided.  Only provided 15 tablets.  Reinforced that this will not be good long-term solution for abdominal pain. 3. No recent or any available lipid panel.  Fasting lipid panel in next 7 days.  Possible hypertriglyceridemia as cause for pancreatitis. 4. Followup if needed in 4 weeks and sooner if needed.

## 2017-04-17 NOTE — Progress Notes (Signed)
Subjective:    Patient ID: Lori Orr, female    DOB: [birth year], 66 y.o.   MRN: [id number]  Lori Orr is a 66 y.o. female presenting on 04/17/2017 for Anxiety (chest pain, referral to psych Bipolar ) and Arm Pain (left pain )   HPI Establish Care New Provider Pt last seen by PCP Dr. Marcell Barlow [address], Dr. Fernande Boyden (pancreatic/liver specialist) [facility].  Last seen about 1year ago.  Request to obtain records.  Psychiatrist: [facility] on [address]. PCP before Dr. Carman Ching clinic on [address].  Daughter and granddaughter live here in [place].  Has moved for 3 months.  Recurrent Pancreatitis Pt has had very frequent hospitalizations for pancreatitis.  She has been taking tramadol when she has onset of pain to avoid utilizing ED, but states she really needs to have GI referral as recommended by ED physicians. States this has been working well.  Her last dose was about 2 weeks ago. - Pt states someone has told her she had high cholesterol in past.  No recent lipid panel. - Pt has poor history regarding GI history but has had previous GI workup w/ Dr. Fernande Boyden at [facility] for pancreatitis recurrence prior to her relocation. - States has taken Creon before, but was taken off.  Does not wish to restart unless recommended by GI.   Cardiology - Has had chest pains.  Hasn't been doing much about this and believes this is related to anxiety. - has hx of anxiety attacks - may have previously been on Xanax. Hypertension: - has had bad headaches, ear pain, sweats.  She notes that these sx are consistent w/ having high blood pressure in past. - Has had followup w/ Dr. [name] since being in [facility].  Pt states is only on 2 BP medications(lower dose metoprolol, lisinopril). She is not taking amlodipine despite it being on her active medication list from Dr. Olin Pia last visit.   Bipolar Disorder Previously took Effexor and Depakote and several other medicines.  Does not remember any  other medications other than when she tried trazodone she had severe nightmares. She doesn't remember what has worked best. - Current symptoms are both of mania and depression.  She states she seems to talk a lot.  She also cries and gets depressed a lot.  She states she doesn't sleep well or for very long.  Then she will wake up and feel like she "slept for hours" even though it was less than 1 hour.  Pt is able to keep employment.  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  She is limited by feeling very tired at work. Occasionally cries at work, but goes to the bathroom.   Smoking Cessation Pt desires to start smoking cessation therapy.  Desires to start nicotine patches.  HEALTH MAINTENANCE: Had dentist visit today for denture plate.  Will be going for cleaning in the next month.  Past Medical History:  Diagnosis Date  . Bradycardia   . Cancer ([place])    ovarian cancer  oophrectomy w/ only "1/4 ovary remaining"  . CHF (congestive heart failure) ([place])   . Chronic kidney disease   . Depression   . Hypertension   . Pancreatitis   . SSS (sick sinus syndrome) ([place])    PPM implant 03/08/11 [facility] [employer] [id number] - SN [id number]   Past Surgical History:  Procedure Laterality Date  . ABDOMINAL HYSTERECTOMY    . APPENDECTOMY    . PACEMAKER  IMPLANT  03/08/2011   [employer] [id number] [phone]) - Implanted For SSS    Social History   Socioeconomic History  . Marital status: Divorced    Spouse name: Not on file  . Number of children: 1  . Years of education: Not on file  . Highest education level: Not on file  Social Needs  . Financial resource strain: Not on file  . Food insecurity - worry: Not on file  . Food insecurity - inability: Not on file  . Transportation needs - medical: Yes  . Transportation needs - non-medical: No  Occupational History  . 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  Tobacco Use  . Smoking status:  Current Every Day Smoker    Packs/day: 1.00    Years: 50.00    Pack years: 50.00    Types: Cigarettes  . Smokeless tobacco: Never Used  Substance and Sexual Activity  . Alcohol use: No  . Drug use: No  . Sexual activity: Not Currently  Other Topics Concern  . Not on file  Social History Narrative   Moving in with her daughter.     Family History  Problem Relation Age of Onset  . Dementia Mother   . Heart attack Father        Died age 66  . Heart disease Sister        Congential  . Heart disease Sister        "Some kind of heart surgery."  . Fibromyalgia Daughter    Current Outpatient Medications on File Prior to Visit  Medication Sig  . lisinopril (PRINIVIL,ZESTRIL) 40 MG tablet Take 1 tablet (40 mg total) by mouth daily.  . metoprolol succinate (TOPROL-XL) 100 MG 24 hr tablet Take 1 tablet (100 mg total) by mouth daily.   No current facility-administered medications on file prior to visit.     Review of Systems  Constitutional: Negative.   HENT: Negative.   Eyes: Negative.   Respiratory: Positive for shortness of breath.        Pt unable to walk long distance w/o stopping to rest.  Cardiovascular: Positive for chest pain.  Gastrointestinal: Positive for abdominal pain and nausea.  Endocrine: Negative.   Genitourinary: Negative.   Musculoskeletal: Positive for arthralgias.  Skin: Negative.   Allergic/Immunologic: Negative.   Neurological: Positive for dizziness and headaches.  Hematological: Negative.   Psychiatric/Behavioral: Positive for dysphoric mood and sleep disturbance. Negative for self-injury and suicidal ideas. The patient is nervous/anxious and is hyperactive.    Per HPI unless specifically indicated above     Objective:    BP (!) 177/104 (BP Location: Right Arm, Patient Position: Sitting, Cuff Size: Normal)   Pulse (!) 59   Temp 98.3 F (36.8 C) (Oral)   Ht 5\' 2"  (1.575 m)   Wt 122 lb 12.8 oz (55.7 kg)   BMI 22.46 kg/m   Wt Readings from  Last 3 Encounters:  04/17/17 122 lb 12.8 oz (55.7 kg)  04/10/17 121 lb 6.4 oz (55.1 kg)  04/09/17 117 lb (53.1 kg)    Physical Exam  Constitutional: She is oriented to person, place, and time. She appears well-developed and well-nourished. No distress.  HENT:  Head: Normocephalic and atraumatic.  Right Ear: Hearing, tympanic membrane, external ear and ear canal normal.  Left Ear: Hearing, tympanic membrane, external ear and ear canal normal.  Nose: Nose normal.  Mouth/Throat: Uvula is midline, oropharynx is clear and moist and mucous membranes are  normal.  Neck: Normal range of motion. Neck supple. No thyromegaly present.  Cardiovascular: Normal rate, regular rhythm, normal heart sounds and intact distal pulses.  Pulmonary/Chest: Effort normal and breath sounds normal. No respiratory distress. She has no wheezes.  Abdominal: Soft. Normal appearance and bowel sounds are normal. She exhibits no distension. There is no hepatosplenomegaly or hepatomegaly. There is tenderness in the epigastric area, periumbilical area and left upper quadrant. There is guarding. There is no rigidity, no CVA tenderness, no tenderness at McBurney's point and negative Murphy's sign.  Lymphadenopathy:    She has no cervical adenopathy.  Neurological: She is alert and oriented to person, place, and time. No cranial nerve deficit.  Skin: Skin is warm and dry. No erythema.  Psychiatric: She has a normal mood and affect. Her behavior is normal. Judgment and thought content normal.  Vitals reviewed.     Assessment & Plan:   Problem List Items Addressed This Visit      Cardiovascular and Mediastinum   Hypertensive disorder    Currently uncontrolled and pt not taking all medications as indicated by Cardiology.  Plan: 1. Continue metoprolol XL 100 mg once daily 2. Continue lisinopril 3. RESUME amlodipine 10 mg once daily. 4. Check BP at home.  Bring log to clinic. 5. Followup 4 weeks and w/ cardiology.       Relevant Medications   amLODipine (NORVASC) 10 MG tablet     Digestive   Recurrent pancreatitis ([place])    Pt w/ long history of recurrent pancreatitis.  Exact onset unknown.  Most recently, pt has been managing pain w/ Tramadol prn.  Pt states is not taking this daily.  Pt denies all alcohol use.  Positive epigastric and LUQ pain today on exam.  Plan: 1. Pt requests GI referral. Referral placed. 2. Pt requests refill Tramadol.  Discussed need to use method that will address pancreatitis more directly.  Offered Creon as alternative to pain management.  Pt refuses until sees GI.   - Refill for tramadol 50 mg provided.  Only provided 15 tablets.  Reinforced that this will not be good long-term solution for abdominal pain. 3. No recent or any available lipid panel.  Fasting lipid panel in next 7 days.  Possible hypertriglyceridemia as cause for pancreatitis. 4. Followup if needed in 4 weeks and sooner if needed.      Relevant Medications   traMADol (ULTRAM) 50 MG tablet   Other Relevant Orders   Ambulatory referral to Gastroenterology   Lipid panel     Other   Bipolar disorder, current episode mixed, moderate ([place]) - Primary    Currently moderate symptoms w/ regular tearfulness and hyperactivity/verbal sharing.  Pt admits she needs medication and is now committed to caring for herself.  Plan: 1. START wellbutrin XL 150 mg once daily.  Would start effexor since was last medication patient remembers but is possible to have interaction w/ tramadol and increase risk of serotonin syndrome. 2. Refer to psychiatry. 3. Followup 4 weeks and as needed.       Relevant Medications   buPROPion (WELLBUTRIN XL) 150 MG 24 hr tablet   Other Relevant Orders   Ambulatory referral to Psychiatry   Ready to quit smoking    Pt is current smoker of 1 ppd x 50 years.  States she is ready to quit and wants to try nicotine patches.  Plan: 1. Discussed all treatment options w/ pt to include wellbutrin,  chantix, nicotine replacement, cutting back w/o pharmacological assistance. Pt  prefers to use nicotine replacement. 2. START nicotine patch 14 mg.  Place one patch once daily and remove old patch.  Use this dose for 21 days. 3. IN 21 days, decrease nicotine to 7 mg patch once daily and continue for 4 weeks.  Then stop.   4. Followup in clinic in 4 weeks.   Discussion today >5 minutes (<10 minutes) specifically on counseling on risks of tobacco use, complications, treatment, smoking cessation.        Other Visit Diagnoses    Encounter for smoking cessation counseling       See AP ready to quit smoking   Relevant Medications   nicotine (NICODERM CQ - DOSED IN MG/24 HOURS) 14 mg/24hr patch   nicotine (NICODERM CQ - DOSED IN MG/24 HR) 7 mg/24hr patch (Start on 05/09/2017)   Left wrist pain       Pt w/ old injury and surgical repair of left wrist.  Pt notes swelling, pain at site.  Plan: 1. DG wrist today.  If abnormal will refer to orthopedics.   Relevant Orders   DG Wrist Complete Left      Meds ordered this encounter  Medications  . DISCONTD: traMADol (ULTRAM) 50 MG tablet    Sig: TK 1 T PO  Q 6 H PRN    Refill:  0  . nicotine (NICODERM CQ - DOSED IN MG/24 HOURS) 14 mg/24hr patch    Sig: Place 1 patch (14 mg total) daily for 21 days onto the skin.    Dispense:  21 patch    Refill:  0    Order Specific Question:   Supervising Provider    Answer:   Olin Hauser [[id number]]  . nicotine (NICODERM CQ - DOSED IN MG/24 HR) 7 mg/24hr patch    Sig: Place 1 patch (7 mg total) daily for 28 days onto the skin.    Dispense:  28 patch    Refill:  0    Order Specific Question:   Supervising Provider    Answer:   Olin Hauser [[id number]]  . amLODipine (NORVASC) 10 MG tablet    Sig: Take 1 tablet (10 mg total) daily by mouth.    Dispense:  90 tablet    Refill:  1    Order Specific Question:   Supervising Provider    Answer:   Olin Hauser [[id number]]  . traMADol  (ULTRAM) 50 MG tablet    Sig: Take 1 tablet (50 mg total) daily as needed by mouth.    Dispense:  15 tablet    Refill:  0    Order Specific Question:   Supervising Provider    Answer:   Olin Hauser [[id number]]  . buPROPion (WELLBUTRIN XL) 150 MG 24 hr tablet    Sig: Take 1 tablet (150 mg total) daily by mouth.    Dispense:  30 tablet    Refill:  2    Order Specific Question:   Supervising Provider    Answer:   Olin Hauser [[id number]]      Follow up plan: Return for smoking cessation, bipolar, pancreatitis, lipids.  A total of 45 minutes was spent face-to-face with this patient. Greater than 50% of this time was spent in counseling and coordination of care with the patient.   Cassell Smiles, DNP, AGPCNP-BC Adult Gerontology Primary Care Nurse Practitioner [facility] 04/17/2017, 5:18 PM

## 2017-04-17 NOTE — Patient Instructions (Addendum)
[place], Thank you for coming in to clinic today.  1. For your smoking cessation: - START using nicotine patch 14 mg once daily.  Each day remove your old patch and place a new patch. - STOP smoking cigarettes while you are on the patches.  If you can't stop smoking, stop the patches.  2. For your high blood pressure: - START amlodipine 10 mg once daily.  This medicine was continued by your Cardiologist and you have not gotten the prescription for it.  This will help with controlling your blood pressure.  3. For psychiatry: - They will call you to schedule an appointment. - START WellbutinXL 150 mg once daily.  4. For your pancreatitis: - Continue tramadol one tablet once daily as needed.  This is not a long-term fix.  We need you to see GI for further evaluation.   - We can always try supplementation of pancreas enzymes with each meal to try to prevent flares. - We also need to check your cholesterol.  You will be due for [place].  This means you should eat no food or drink after midnight.  Drink only water or coffee without cream/sugar on the morning of your lab visit. - Please go ahead and schedule a "Lab Only" visit in the morning at the clinic for lab draw in the next 7 days. - Your results will be available about 2-3 days after blood draw.  If you have set up a MyChart account, you can can log in to MyChart online to view your results and a brief explanation. Also, we can discuss your results together at your next office visit if you would like.  5. For your wrist, We are getting an xray today.  We can send you to orthopedics if the xray is abnormal.   Please schedule a follow-up appointment with Cassell Smiles, AGNP. Return for smoking cessation, bipolar, pancreatitis, lipids.   If you have any other questions or concerns, please feel free to call the clinic or send a message through [place]. You may also schedule an earlier appointment if necessary.  You will receive a  survey after today's visit either digitally by e-mail or paper by [employer]. Your experiences and feedback matter to [place].  Please respond so we know how we are doing as we provide care for you.   Cassell Smiles, DNP, AGNP-BC Adult Gerontology Nurse Practitioner [place]

## 2017-04-17 NOTE — Assessment & Plan Note (Signed)
Currently moderate symptoms w/ regular tearfulness and hyperactivity/verbal sharing.  Pt admits she needs medication and is now committed to caring for herself.  Plan: 1. START wellbutrin XL 150 mg once daily.  Would start effexor since was last medication patient remembers but is possible to have interaction w/ tramadol and increase risk of serotonin syndrome. 2. Refer to psychiatry. 3. Followup 4 weeks and as needed.

## 2017-04-17 NOTE — Assessment & Plan Note (Signed)
Currently uncontrolled and pt not taking all medications as indicated by Cardiology.  Plan: 1. Continue metoprolol XL 100 mg once daily 2. Continue lisinopril 3. RESUME amlodipine 10 mg once daily. 4. Check BP at home.  Bring log to clinic. 5. Followup 4 weeks and w/ cardiology.

## 2017-04-17 NOTE — Assessment & Plan Note (Signed)
Pt is current smoker of 1 ppd x 50 years.  States she is ready to quit and wants to try nicotine patches.  Plan: 1. Discussed all treatment options w/ pt to include wellbutrin, chantix, nicotine replacement, cutting back w/o pharmacological assistance. Pt prefers to use nicotine replacement. 2. START nicotine patch 14 mg.  Place one patch once daily and remove old patch.  Use this dose for 21 days. 3. IN 21 days, decrease nicotine to 7 mg patch once daily and continue for 4 weeks.  Then stop.   4. Followup in clinic in 4 weeks.   Discussion today >5 minutes (<10 minutes) specifically on counseling on risks of tobacco use, complications, treatment, smoking cessation.

## 2017-04-18 ENCOUNTER — Other Ambulatory Visit: Payer: Self-pay | Admitting: Nurse Practitioner

## 2017-04-18 DIAGNOSIS — Z716 Tobacco abuse counseling: Secondary | ICD-10-CM

## 2017-04-18 MED ORDER — DICLOFENAC SODIUM 1 % TD GEL: 2.0000 g | Freq: Four times a day (QID) | TRANSDERMAL | 2 refills | Status: DC | PRN

## 2017-04-24 ENCOUNTER — Other Ambulatory Visit: Payer: Medicare Other

## 2017-04-25 ENCOUNTER — Telehealth: Payer: Self-pay | Admitting: Family Medicine

## 2017-04-25 NOTE — Telephone Encounter (Signed)
Called pt to sched for AWV with Nurse Health Advisor. Last AWV on 12/11/12  please schedule AWV with NHA any date. C/b #  [phone] on Skype [email or web address] if you have questions

## 2017-05-01 ENCOUNTER — Other Ambulatory Visit: Payer: Medicare Other

## 2017-05-01 ENCOUNTER — Ambulatory Visit: Payer: Medicare Other | Admitting: Gastroenterology

## 2017-05-01 ENCOUNTER — Other Ambulatory Visit: Payer: Self-pay

## 2017-05-01 ENCOUNTER — Encounter: Payer: Self-pay | Admitting: *Deleted

## 2017-05-01 ENCOUNTER — Emergency Department
Admission: EM | Admit: 2017-05-01 | Discharge: 2017-05-01 | Disposition: A | Discharge status: Home / Self Care | Payer: Medicare Other | Attending: Emergency Medicine | Admitting: Emergency Medicine

## 2017-05-01 DIAGNOSIS — L089 Local infection of the skin and subcutaneous tissue, unspecified: Secondary | ICD-10-CM | POA: Insufficient documentation

## 2017-05-01 DIAGNOSIS — Z79899 Other long term (current) drug therapy: Secondary | ICD-10-CM | POA: Diagnosis not present

## 2017-05-01 DIAGNOSIS — S61011D Laceration without foreign body of right thumb without damage to nail, subsequent encounter: Secondary | ICD-10-CM | POA: Insufficient documentation

## 2017-05-01 DIAGNOSIS — W260XXA Contact with knife, initial encounter: Secondary | ICD-10-CM | POA: Diagnosis not present

## 2017-05-01 DIAGNOSIS — F17210 Nicotine dependence, cigarettes, uncomplicated: Secondary | ICD-10-CM | POA: Insufficient documentation

## 2017-05-01 DIAGNOSIS — I509 Heart failure, unspecified: Secondary | ICD-10-CM | POA: Insufficient documentation

## 2017-05-01 DIAGNOSIS — S60931A Unspecified superficial injury of right thumb, initial encounter: Secondary | ICD-10-CM

## 2017-05-01 DIAGNOSIS — I130 Hypertensive heart and chronic kidney disease with heart failure and stage 1 through stage 4 chronic kidney disease, or unspecified chronic kidney disease: Secondary | ICD-10-CM | POA: Insufficient documentation

## 2017-05-01 DIAGNOSIS — N189 Chronic kidney disease, unspecified: Secondary | ICD-10-CM | POA: Diagnosis not present

## 2017-05-01 DIAGNOSIS — S6991XD Unspecified injury of right wrist, hand and finger(s), subsequent encounter: Secondary | ICD-10-CM | POA: Diagnosis present

## 2017-05-01 DIAGNOSIS — S6991XA Unspecified injury of right wrist, hand and finger(s), initial encounter: Secondary | ICD-10-CM | POA: Diagnosis not present

## 2017-05-01 MED ORDER — CEPHALEXIN 500 MG PO CAPS: 500.0000 mg | ORAL_CAPSULE | Freq: Four times a day (QID) | ORAL | 0 refills | Status: AC

## 2017-05-01 MED ORDER — TRAMADOL HCL 50 MG PO TABS: 50.0000 mg | ORAL_TABLET | Freq: Four times a day (QID) | ORAL | 0 refills | Status: DC | PRN

## 2017-05-01 MED ORDER — IBUPROFEN 600 MG PO TABS: 600.0000 mg | ORAL_TABLET | Freq: Three times a day (TID) | ORAL | 0 refills | Status: DC | PRN

## 2017-05-01 MED ORDER — TRAMADOL HCL 50 MG PO TABS
50.0000 mg | ORAL_TABLET | Freq: Once | ORAL | Status: AC
  Administered 2017-05-01: 50 mg via ORAL
  Filled 2017-05-01: qty 1

## 2017-05-01 MED ORDER — CEPHALEXIN 500 MG PO CAPS
500.0000 mg | ORAL_CAPSULE | Freq: Once | ORAL | Status: AC
  Administered 2017-05-01: 500 mg via ORAL
  Filled 2017-05-01: qty 1

## 2017-05-01 MED ORDER — IBUPROFEN 600 MG PO TABS
600.0000 mg | ORAL_TABLET | Freq: Once | ORAL | Status: AC
  Administered 2017-05-01: 600 mg via ORAL
  Filled 2017-05-01: qty 1

## 2017-05-01 NOTE — ED Triage Notes (Signed)
Pt was here 1 week ago right thumb laceration , pt reports close wound hurts and has a "black area" now

## 2017-05-01 NOTE — ED Provider Notes (Signed)
[facility] Emergency Department Provider Note   ____________________________________________   First MD Initiated Contact with Patient 05/01/17 1017     (approximate)  I have reviewed the triage vital signs and the nursing notes.   HISTORY  Chief Complaint Laceration    HPI Lori Orr is a 66 y.o. female patient presents with a laceration to the right thumb. Patientcut her thumb on the knife up appearing fluid at work. Patient denies significant medical care on the date of injury. Patient state increased pain and edema. Patient rates pain as a 10 over 10. Patient described a pain as "achy". No palliative measures for complaint. Patient tetanus shot is not up to date.   Past Medical History:  Diagnosis Date  . Bradycardia   . Cancer ([place])    ovarian cancer  oophrectomy w/ only "1/4 ovary remaining"  . CHF (congestive heart failure) ([place])   . Chronic kidney disease   . Depression   . Hypertension   . Pancreatitis   . SSS (sick sinus syndrome) ([place])    PPM implant 03/08/11 - [employer] [id number] - SN [id number]    Patient Active Problem List   Diagnosis Date Noted  . Bipolar disorder, current episode mixed, moderate ([place]) 04/17/2017  . Recurrent pancreatitis ([place]) 04/17/2017  . Ready to quit smoking 04/17/2017  . Atypical chest pain 02/25/2017  . Abnormal EKG 02/25/2017  . Cardiac pacemaker in situ 03/29/2016  . History of malignant neoplasm of cervix 03/29/2016  . Hypertensive disorder 03/29/2016  . Kidney stone 03/29/2016  . Multiple nodules of lung 03/29/2016    Past Surgical History:  Procedure Laterality Date  . ABDOMINAL HYSTERECTOMY    . APPENDECTOMY    . PACEMAKER IMPLANT  03/08/2011   [employer] [id number] [phone]) - Implanted For SSS    Prior to Admission medications   Medication Sig Start Date End Date Taking? Authorizing Provider  amLODipine (NORVASC) 10 MG tablet Take 1 tablet (10 mg total) daily by mouth. 04/17/17    Mikey College, NP  buPROPion (WELLBUTRIN XL) 150 MG 24 hr tablet Take 1 tablet (150 mg total) daily by mouth. 04/17/17   Mikey College, NP  cephALEXin (KEFLEX) 500 MG capsule Take 1 capsule (500 mg total) 4 (four) times daily for 10 days by mouth. 05/01/17 05/11/17  Sable Feil, PA-C  diclofenac sodium (VOLTAREN) 1 % GEL Apply 2 g 4 (four) times daily as needed topically (moderate wrist pain). 04/18/17   Mikey College, NP  ibuprofen (ADVIL,MOTRIN) 600 MG tablet Take 1 tablet (600 mg total) every 8 (eight) hours as needed by mouth. 05/01/17   Sable Feil, PA-C  lisinopril (PRINIVIL,ZESTRIL) 40 MG tablet Take 1 tablet (40 mg total) by mouth daily. 04/10/17   Deboraha Sprang, MD  metoprolol succinate (TOPROL-XL) 100 MG 24 hr tablet Take 1 tablet (100 mg total) by mouth daily. 04/10/17   Deboraha Sprang, MD  nicotine (NICODERM CQ - DOSED IN MG/24 HOURS) 14 mg/24hr patch Place 1 patch (14 mg total) daily for 21 days onto the skin. 04/17/17 05/08/17  Mikey College, NP  nicotine (NICODERM CQ - DOSED IN MG/24 HR) 7 mg/24hr patch Place 1 patch (7 mg total) daily for 28 days onto the skin. 05/09/17 06/06/17  Mikey College, NP  traMADol (ULTRAM) 50 MG tablet Take 1 tablet (50 mg total) daily as needed by mouth. 04/17/17   Mikey College, NP  traMADol Veatrice Bourbon) 50 MG tablet  Take 1 tablet (50 mg total) every 6 (six) hours as needed by mouth for moderate pain. 05/01/17   Sable Feil, PA-C    Allergies Sulfa antibiotics; Acetaminophen; and Trazodone and nefazodone  Family History  Problem Relation Age of Onset  . Dementia Mother   . Heart attack Father        Died age 24  . Heart disease Sister        Congential  . Heart disease Sister        "Some kind of heart surgery."  . Fibromyalgia Daughter     Social History Social History   Tobacco Use  . Smoking status: Current Every Day Smoker    Packs/day: 1.00    Years: 50.00    Pack years: 50.00      Types: Cigarettes  . Smokeless tobacco: Never Used  Substance Use Topics  . Alcohol use: No  . Drug use: No    Review of Systems Constitutional: No fever/chills Eyes: No visual changes. ENT: No sore throat. Cardiovascular: Denies chest pain. Respiratory: Denies shortness of breath. Gastrointestinal: No abdominal pain.  No nausea, no vomiting.  No diarrhea.  No constipation. Genitourinary: Negative for dysuria. Musculoskeletal: Negative for back pain. Skin: Negative for rash. Edema and erythema Neurological: Negative for headaches, focal weakness or numbness. Psychiatric:Bipolar Endocrine:Hypertension Allergic/Immunilogical: See medication list ____________________________________________   PHYSICAL EXAM:  VITAL SIGNS: ED Triage Vitals  Enc Vitals Group     BP 05/01/17 0938 138/85     Pulse Rate 05/01/17 0938 (!) 59     Resp 05/01/17 0938 18     Temp 05/01/17 0938 97.8 F (36.6 C)     Temp Source 05/01/17 0938 Oral     SpO2 05/01/17 0938 97 %     Weight 05/01/17 0936 122 lb (55.3 kg)     Height 05/01/17 0936 5\' 2"  (1.575 m)     Head Circumference --      Peak Flow --      Pain Score 05/01/17 0936 10     Pain Loc --      Pain Edu? --      Excl. in [place]? --    Constitutional: Alert and oriented. Well appearing and in no acute distress. Cardiovascular: Normal rate, regular rhythm. Grossly normal heart sounds.  Good peripheral circulation. Respiratory: Normal respiratory effort.  No retractions. Lungs CTAB. Gastrointestinal: Soft and nontender. No distention. No abdominal bruits. No CVA tenderness. Musculoskeletal: No lower extremity tenderness nor edema.  No joint effusions. Neurologic:  Normal speech and language. No gross focal neurologic deficits are appreciated. No gait instability. Skin:  Edema and erythema distal phalanges right thumb. Psychiatric: Mood and affect are normal. Speech and behavior are normal.  ____________________________________________    LABS (all labs ordered are listed, but only abnormal results are displayed)  Labs Reviewed - No data to display ____________________________________________  EKG   ____________________________________________  RADIOLOGY  No results found.  ____________________________________________   PROCEDURES  Procedure(s) performed: None  Procedures  Critical Care performed: No  ____________________________________________   INITIAL IMPRESSION / ASSESSMENT AND PLAN / ED COURSE  As part of my medical decision making, I reviewed the following data within the electronic MEDICAL RECORD NUMBER    Infected old laceration to the right thumb. Patient given discharge care instructions. Patient advised take medication as directed. Patient finds for PCP if no improvement in 3-5 days.      ____________________________________________   FINAL CLINICAL IMPRESSION(S) / ED DIAGNOSES  Final  diagnoses:  Infected superficial injury of right thumb, initial encounter     ED Discharge Orders        Ordered    cephALEXin (KEFLEX) 500 MG capsule  4 times daily     05/01/17 1028    traMADol (ULTRAM) 50 MG tablet  Every 6 hours PRN     05/01/17 1028    ibuprofen (ADVIL,MOTRIN) 600 MG tablet  Every 8 hours PRN     05/01/17 1028       Note:  This document was prepared using Dragon voice recognition software and may include unintentional dictation errors.    Sable Feil, PA-C 05/01/17 1029    Earleen Newport, MD 05/01/17 [phone]

## 2017-05-01 NOTE — Discharge Instructions (Signed)
Advised epsom salt soak 10 minutes twice a day or 2-3 days. Take medication as directed.

## 2017-05-08 ENCOUNTER — Encounter: Payer: Self-pay | Admitting: Gastroenterology

## 2017-05-08 ENCOUNTER — Ambulatory Visit: Payer: Medicare Other | Admitting: Psychiatry

## 2017-05-08 ENCOUNTER — Ambulatory Visit (INDEPENDENT_AMBULATORY_CARE_PROVIDER_SITE_OTHER): Payer: Medicare Other | Admitting: Gastroenterology

## 2017-05-08 ENCOUNTER — Encounter (INDEPENDENT_AMBULATORY_CARE_PROVIDER_SITE_OTHER): Payer: Self-pay

## 2017-05-08 VITALS — BP 146/88 | HR 60 | Temp 98.5°F | Wt 125.2 lb

## 2017-05-08 DIAGNOSIS — K861 Other chronic pancreatitis: Secondary | ICD-10-CM

## 2017-05-08 DIAGNOSIS — K859 Acute pancreatitis without necrosis or infection, unspecified: Secondary | ICD-10-CM

## 2017-05-08 MED ORDER — DOXYCYCLINE HYCLATE 100 MG PO CAPS: 100.0000 mg | ORAL_CAPSULE | Freq: Two times a day (BID) | ORAL | 0 refills | Status: AC

## 2017-05-08 NOTE — Progress Notes (Signed)
Cephas Darby, MD [address]  [place]  [place], [place] [address]  Main: [phone]  Fax: [phone]    Gastroenterology Consultation  Referring Provider:     Mikey College, * Primary Care Physician:  Olin Hauser, DO Primary Gastroenterologist:  Dr. Cephas Darby Reason for Consultation:    Recurrent acute pancreatitis        HPI:   Lori Orr is a 66 y.o. female referred by Dr. Parks Ranger, Devonne Doughty, DO  for consultation & management of recurrent acute pancreatitis. She has h/o chronic tobacco use, status post pacemaker for sick sinus syndrome, CHF, CKD, hypertension, history of ovarian cancer.  She had several ER visits in the last 1 and half year secondary to acute episodes of epigastric pain associated with nausea, vomiting and elevated lipase.  Her lipase was elevated to more than 3 times upper limit of normal on separate occasions.  She had cross-sectional imaging in 01/30/17 and 03/02/17 which did not reveal evidence of cholelithiasis or choledocholithiasis.  Most recent imaging from 02/25/2017 revealed mild pancreatic ductal dilation and stones in the pancreatic duct are adjacent to the pancreatic duct and associated calcifications in the pancreatic parenchyma.  She is upset today that she is not aware of the CT findings from last imaging.  She spends most of her time in [place] and planning to go to [place] next week to take care of her boyfriend who is dealing with cancer. Patient reports that she was admitted at [facility] previously for acute pancreatitis and she saw a pancreas specialist who performed EUS.  She is not aware of the findings.  She has chronic epigastric pain associated with bloating and intermittent nonbloody diarrhea.  She continues to smoke tobacco.  She denies weight loss, nausea, vomiting.  She denies fever, chills, constipation.  NSAIDs: Taking ibuprofen for epigastric pain  Antiplts/Anticoagulants/Anti  thrombotics: None  GI Procedures:  EGD 08/20/2013 at [facility]: Normal proximal third of the esophagus, middle third of the  esophagus, and distal third of the esophagus.The GE junction was  visualized.Erythematous mucosa was found in the antrum.Erosions found  in the mid body of the stomach (535.4).Normal duodenal bulb, 1st portion  of the duodenum, and 2nd portion of the duodenum. STOMACH (BIOPSY):ANTRAL AND OXYNTIC MUCOSA WITH FOCAL ACUTE CRYPTITIS.NO GRANULOMATOUS INFLAMMATION.NO HELICOBACTER PYLORI ORGANISMS ARE IDENTIFIED ON DIFF-QUIK STAIN.SEE NOTE.   NOTE:A PAS/AB STAIN WAS REVIEWED FOR THIS SPECIMEN.   NOTE:THE DIFFERENTIAL DIAGNOSIS INCLUDES INFLAMMATORY BOWEL DISEASE, DRUG REACTION (I.E., NSAIDS) OR OTHER NONSPECIFIC REACTION.  GIVEN THE HISTORY OF A KIDNEY STONE, THIS MAY BE A REACTION TO NSAIDS OR OTHER MEDICATION.NO OTHER SIGNS OF IBD ARE NOTED.GRANULOMATOUS INFLAMMATION IS ABSENT.   Colonoscopy 08/20/2013 at [facility] - Two subcentimeter sessile polyps in the transverse colon,  removed - 5 mm sessile polyp in the sigmoid colon, removed - Mild  diverticulosis, ascending and sigmoid colon - Small internal hemorrhoids 1)COLON, TRANSVERSE, POLYP (BIOPSY #1):COLONIC MUCOSA WITH  NONDIAGNOSTIC FINDINGS.   2)COLON, TRANSVERSE, POLYP (BIOPSY #2):TUBULAR ADENOMA.   3)COLON, SIGMOIDPOLYP (BIOPSY):HYPERPLASTIC POLYP.  Past Medical History:  Diagnosis Date  . Bradycardia   . Cancer ([place])    ovarian cancer  oophrectomy w/ only "1/4 ovary remaining"  . CHF (congestive heart failure) ([place])   . Chronic kidney disease   . Depression   . Hypertension   . Pancreatitis   . SSS (sick sinus syndrome) ([place])    PPM implant 03/08/11 - [facility] - SN [id number]  Past Surgical History:  Procedure Laterality Date  . ABDOMINAL HYSTERECTOMY    . APPENDECTOMY    . PACEMAKER IMPLANT  03/08/2011   [employer]  [id number] [phone]) - Implanted For SSS     Current Outpatient Medications:  .  amLODipine (NORVASC) 10 MG tablet, Take 1 tablet (10 mg total) daily by mouth., Disp: 90 tablet, Rfl: 1 .  buPROPion (WELLBUTRIN XL) 150 MG 24 hr tablet, Take 1 tablet (150 mg total) daily by mouth., Disp: 30 tablet, Rfl: 2 .  cephALEXin (KEFLEX) 500 MG capsule, Take 1 capsule (500 mg total) 4 (four) times daily for 10 days by mouth., Disp: 40 capsule, Rfl: 0 .  ibuprofen (ADVIL,MOTRIN) 600 MG tablet, Take 1 tablet (600 mg total) every 8 (eight) hours as needed by mouth., Disp: 15 tablet, Rfl: 0 .  lisinopril (PRINIVIL,ZESTRIL) 40 MG tablet, Take 1 tablet (40 mg total) by mouth daily., Disp: 90 tablet, Rfl: 3 .  metoprolol succinate (TOPROL-XL) 100 MG 24 hr tablet, Take 1 tablet (100 mg total) by mouth daily., Disp: 90 tablet, Rfl: 3 .  nicotine (NICODERM CQ - DOSED IN MG/24 HOURS) 14 mg/24hr patch, Place 1 patch (14 mg total) daily for 21 days onto the skin., Disp: 21 patch, Rfl: 0 .  traMADol (ULTRAM) 50 MG tablet, Take 1 tablet (50 mg total) daily as needed by mouth., Disp: 15 tablet, Rfl: 0 .  traMADol (ULTRAM) 50 MG tablet, Take 1 tablet (50 mg total) every 6 (six) hours as needed by mouth for moderate pain., Disp: 12 tablet, Rfl: 0 .  diclofenac sodium (VOLTAREN) 1 % GEL, Apply 2 g 4 (four) times daily as needed topically (moderate wrist pain). (Patient not taking: Reported on 05/08/2017), Disp: 100 g, Rfl: 2 .  doxycycline (VIBRAMYCIN) 100 MG capsule, Take 1 capsule (100 mg total) by mouth 2 (two) times daily for 14 days., Disp: 28 capsule, Rfl: 0 .  [START ON 05/09/2017] nicotine (NICODERM CQ - DOSED IN MG/24 HR) 7 mg/24hr patch, Place 1 patch (7 mg total) daily for 28 days onto the skin. (Patient not taking: Reported on 05/08/2017), Disp: 28 patch, Rfl: 0  Family History  Problem Relation Age of Onset  . Dementia Mother   . Heart attack Father        Died age 67  . Heart disease Sister        Congential  .  Heart disease Sister        "Some kind of heart surgery."  . Fibromyalgia Daughter      Social History   Tobacco Use  . Smoking status: Current Every Day Smoker    Packs/day: 1.00    Years: 50.00    Pack years: 50.00    Types: Cigarettes  . Smokeless tobacco: Never Used  Substance Use Topics  . Alcohol use: No  . Drug use: No    Allergies as of 05/08/2017 - Review Complete 05/08/2017  Allergen Reaction Noted  . Sulfa antibiotics Rash 10/07/2016  . Acetaminophen  01/29/2016  . Trazodone and nefazodone Other (See Comments) 04/17/2017    Review of Systems:    All systems reviewed and negative except where noted in HPI.   Physical Exam:  BP (!) 146/88   Pulse 60   Temp 98.5 F (36.9 C) (Oral)   Wt 125 lb 3.2 oz (56.8 kg)   BMI 22.90 kg/m  No LMP recorded. Patient has had a hysterectomy.  General:   Alert,  Well-developed, well-nourished, pleasant and  cooperative in NAD Head:  Normocephalic and atraumatic. Eyes:  Sclera clear, no icterus.   Conjunctiva pink. Ears:  Normal auditory acuity. Nose:  No deformity, discharge, or lesions. Mouth:  No deformity or lesions,oropharynx pink & moist. Neck:  Supple; no masses or thyromegaly. Lungs:  Respirations even and unlabored.  Clear throughout to auscultation.   No wheezes, crackles, or rhonchi. No acute distress. Heart:  Regular rate and rhythm; no murmurs, clicks, rubs, or gallops. Abdomen:  Normal bowel sounds. Soft, mild epigastric tenderness and distended, tympanic without masses, hepatosplenomegaly or hernias noted.  No guarding or rebound tenderness.   Rectal: Nor performed Msk:  Symmetrical without gross deformities. Good, equal movement & strength bilaterally. Pulses:  Normal pulses noted. Extremities:  No clubbing or edema.  No cyanosis. Neurologic:  Alert and oriented x3;  grossly normal neurologically. Skin:  Intact without significant lesions or rashes. No jaundice. Lymph Nodes:  No significant cervical  adenopathy. Psych:  Alert and cooperative. Normal mood and affect.  Imaging Studies: Reviewed  Assessment and Plan:   Lori Orr is a 66 y.o. female with chronic tobacco use, recurrent acute pancreatitis presents for further evaluation. Her CT findings are highly suggestive of chronic pancreatitis which she is at risk given her long smoking history.  She probably might need extraction of the stone and pancreatic duct stent placement to prevent recurrent attacks of acute pancreatitis.  She will need to see advanced endoscopist who does EUS +/- pancreatic duct stent placement I discussed with her that I do not perform these and recommended to [facility] or [facility].  She preferred to have further care in [place] as she is moving there next week and she already has seen GI in the past She may also have exocrine pancreatic insufficiency given history of intermittent diarrhea  I gave her some Creon samples that may help to alleviate pain and diarrhea Recommend to check pancreatic fecal elastase to confirm EPI She preferred to have further care after she moved to [facility]   Follow up as needed   Cephas Darby, MD

## 2017-05-15 ENCOUNTER — Other Ambulatory Visit: Payer: Self-pay

## 2017-05-15 ENCOUNTER — Ambulatory Visit (INDEPENDENT_AMBULATORY_CARE_PROVIDER_SITE_OTHER): Payer: Medicare Other | Admitting: Nurse Practitioner

## 2017-05-15 ENCOUNTER — Ambulatory Visit (HOSPITAL_COMMUNITY): Payer: Medicare Other

## 2017-05-15 ENCOUNTER — Encounter: Payer: Self-pay | Admitting: Nurse Practitioner

## 2017-05-15 VITALS — BP 148/90 | HR 59 | Temp 98.1°F | Ht 62.0 in | Wt 123.2 lb

## 2017-05-15 DIAGNOSIS — IMO0002 Reserved for concepts with insufficient information to code with codable children: Secondary | ICD-10-CM

## 2017-05-15 DIAGNOSIS — F3162 Bipolar disorder, current episode mixed, moderate: Secondary | ICD-10-CM

## 2017-05-15 DIAGNOSIS — K861 Other chronic pancreatitis: Secondary | ICD-10-CM | POA: Diagnosis not present

## 2017-05-15 DIAGNOSIS — F5104 Psychophysiologic insomnia: Secondary | ICD-10-CM | POA: Diagnosis not present

## 2017-05-15 DIAGNOSIS — S61011D Laceration without foreign body of right thumb without damage to nail, subsequent encounter: Secondary | ICD-10-CM

## 2017-05-15 DIAGNOSIS — K859 Acute pancreatitis without necrosis or infection, unspecified: Secondary | ICD-10-CM

## 2017-05-15 MED ORDER — BUPROPION HCL ER (XL) 300 MG PO TB24: 300.0000 mg | ORAL_TABLET | Freq: Every day | ORAL | 1 refills | Status: DC

## 2017-05-15 MED ORDER — QUETIAPINE FUMARATE 50 MG PO TABS: 25.0000 mg | ORAL_TABLET | Freq: Every day | ORAL | 5 refills | Status: DC

## 2017-05-15 NOTE — Progress Notes (Signed)
Subjective:    Patient ID: Lori Orr, female    DOB: [birth year], 67 y.o.   MRN: [id number]  Lori Orr is a 66 y.o. female presenting on 05/15/2017 for Depression (Bipolar disorder - mixed) and Cyst (skin right upper arm)   HPI Chronic pancreatitis Patient has recently seen GI and was started on Creon.  She has unsure if creon is helping yet because she still has intermittent abdominal pain.  Patient admits she got upset w/ GI doc r/t results of her CT scan because she does not fully understand the results.  Also told she has kidney cysts and requests information about how to follow-up.  Laceration of right thumb Patient reports she had laceration to the right thumb when at work.  She covered it with a Band-Aid and wear a glove and subsequent work days.  She reported to the emergency room on 05/01/2017 for care.  She was provided antibiotics for infection.  She did not have improvement but did not call PCP.  Dr. Marius Ditch provided patient with additional antibiotics and very reports that antibiotic helped heal the wound.  She has no active drainage.  Depression  Patient is also here today for follow-up of her depression.  She states the Wellbutrin is helping her and desires a higher dose.  Good relief of symptoms with less anhedonia, but not complete relief.  She also has less tearfulness and less hyperactivity.  Is not yet sleeping any better. -She reports recent stressor for her boyfriend in [place] being diagnosed with cancer.  She plans to go to [place] to help take care of him.  She does not know how long she will stay, does not plan to be gone more than 3-6 months.  Depression screen PHQ 2/9 04/17/2017  Decreased Interest 3  Down, Depressed, Hopeless 2  PHQ - 2 Score 5  Altered sleeping 3  Tired, decreased energy 3  Change in appetite 0  Feeling bad or failure about yourself  0  Trouble concentrating 2  Moving slowly or fidgety/restless 1  Suicidal thoughts 0  PHQ-9 Score 14   Difficult doing work/chores Somewhat difficult   Will need referral to GI in [place], dermatology, renal.  Pt instructed to call [facility] if needs referrals and for exact information about doctors preferred.  Social History   Tobacco Use  . Smoking status: Current Every Day Smoker    Packs/day: 1.00    Years: 50.00    Pack years: 50.00    Types: Cigarettes  . Smokeless tobacco: Never Used  Substance Use Topics  . Alcohol use: No  . Drug use: No    Review of Systems Per HPI unless specifically indicated above     Objective:    BP (!) 148/90 (BP Location: Left Arm, Patient Position: Sitting, Cuff Size: Normal)   Pulse (!) 59   Temp 98.1 F (36.7 C) (Oral)   Ht 5\' 2"  (1.575 m)   Wt 123 lb 3.2 oz (55.9 kg)   BMI 22.53 kg/m   Wt Readings from Last 3 Encounters:  05/15/17 123 lb 3.2 oz (55.9 kg)  05/08/17 125 lb 3.2 oz (56.8 kg)  05/01/17 122 lb (55.3 kg)    Physical Exam  Constitutional: She is oriented to person, place, and time. She appears well-developed and well-nourished. No distress.  HENT:  Head: Normocephalic and atraumatic.  Cardiovascular: Normal rate, regular rhythm, normal heart sounds and intact distal pulses.  Pulmonary/Chest: Effort normal and breath sounds normal.  Abdominal: Soft. Normal appearance  and bowel sounds are normal. There is no hepatosplenomegaly. There is tenderness (Generalized w/ > tenderness in LUQ). There is no rigidity, no guarding, no tenderness at McBurney's point and negative Murphy's sign.  Neurological: She is alert and oriented to person, place, and time.  Skin: Skin is warm and dry.  Psychiatric: Her behavior is normal. Judgment and thought content normal. Her mood appears anxious. Her speech is rapid and/or pressured and tangential. She expresses no suicidal plans and no homicidal plans.  Vitals reviewed.     Assessment & Plan:   Problem List Items Addressed This Visit      Digestive   Recurrent pancreatitis ([place])    Pt w/  long history of recurrent pancreatitis.  Exact onset unknown.  Patient had GI eval since last visit has been started on Creon.   Plan: 1. Continue GI management of pancreatitis. 2. Continue creon and take this regularly.  Pt verbalizes better understanding of her condition after brief explanation about how chronic pancreatitis is causing her daily pain. 3. Followup as needed.        Other   Bipolar disorder, current episode mixed, moderate ([facility])    Currently moderate symptoms w/ regular tearfulness and hyperactivity/verbal sharing.  Pt admits she needs medication and is now committed to caring for herself.  Plan: 1. INCREASE to bupropion ER 300 mg once daily - To help with sleep, start seroquel 50 mg tablet once daily at bedtime.  Take 1/2 tab for 1 week, then increase to 1 full tab.  Continue full tablet if tolerated. 2. Recommend pt consider psychiatry if she is staying in [place] for longer than 2 months. 3. Followup 3 months and as needed.  If back in [place] in 6 weeks, please come to clinic for appointment.       Relevant Medications   buPROPion (WELLBUTRIN XL) 300 MG 24 hr tablet   QUEtiapine (SEROQUEL) 50 MG tablet   Psychophysiological insomnia - Primary    See AP Bipolar disorder.      Relevant Medications   QUEtiapine (SEROQUEL) 50 MG tablet    Other Visit Diagnoses    Laceration of right thumb without damage to nail, foreign body presence unspecified, subsequent encounter       Pt w/ normal healing of right thumb.  Scar is forming.  Pt has no additional concerns or need for treatment.      Meds ordered this encounter  Medications  . buPROPion (WELLBUTRIN XL) 300 MG 24 hr tablet    Sig: Take 1 tablet (300 mg total) by mouth daily.    Dispense:  90 tablet    Refill:  1    Order Specific Question:   Supervising Provider    Answer:   Olin Hauser [[id number]]  . QUEtiapine (SEROQUEL) 50 MG tablet    Sig: Take 0.5-1 tablets (25-50 mg total) by mouth at  bedtime.    Dispense:  30 tablet    Refill:  5    Order Specific Question:   Supervising Provider    Answer:   Olin Hauser [[id number]]   Follow up plan: Return in about 3 months (around 08/13/2017) for Bipolar Disorder, Hypertension, Chronic Pancreatitis.   Cassell Smiles, DNP, AGPCNP-BC Adult Gerontology Primary Care Nurse Practitioner [facility] 05/19/2017, 1:18 PM

## 2017-05-15 NOTE — Patient Instructions (Addendum)
Lori Orr, Thank you for coming in to clinic today.  1. For needed referrals, find out where you would like to go when you are in [place].  Call [facility] for the referrals.  - You will need a GI doc for your chronic pancreatitis, a kidney doc (nephrologist) for your kidney cysts, and a skin doctor (dermatologist) for the arm and leg cysts.  2. For your bipolar disorder: - START bupropion ER 300 mg once daily - To help with sleep, start seroquel 50 mg tablet once daily at bedtime.  You can take 1/2 to 1 tablet once daily.  Take 1/2 tab for 1 week, then increase to 1 full tab.  Continue full tablet if you can tolerate it.  Please schedule a follow-up appointment with Cassell Smiles, AGNP. Return in about 3 months (around 08/13/2017) for Bipolar Disorder, Hypertension, Chronic Pancreatitis.   If you have any other questions or concerns, please feel free to call the clinic or send a message through [place]. You may also schedule an earlier appointment if necessary.  You will receive a survey after today's visit either digitally by e-mail or paper by [employer]. Your experiences and feedback matter to [place].  Please respond so we know how we are doing as we provide care for you.   Cassell Smiles, DNP, AGNP-BC Adult Gerontology Nurse Practitioner [facility]

## 2017-05-19 ENCOUNTER — Encounter: Payer: Self-pay | Admitting: Nurse Practitioner

## 2017-05-19 DIAGNOSIS — F5104 Psychophysiologic insomnia: Secondary | ICD-10-CM | POA: Insufficient documentation

## 2017-05-19 NOTE — Assessment & Plan Note (Signed)
Currently moderate symptoms w/ regular tearfulness and hyperactivity/verbal sharing.  Pt admits she needs medication and is now committed to caring for herself.  Plan: 1. INCREASE to bupropion ER 300 mg once daily - To help with sleep, start seroquel 50 mg tablet once daily at bedtime.  Take 1/2 tab for 1 week, then increase to 1 full tab.  Continue full tablet if tolerated. 2. Recommend pt consider psychiatry if she is staying in [place] for longer than 2 months. 3. Followup 3 months and as needed.  If back in [place] in 6 weeks, please come to clinic for appointment.

## 2017-05-19 NOTE — Assessment & Plan Note (Signed)
See AP Bipolar disorder.

## 2017-05-19 NOTE — Assessment & Plan Note (Signed)
Pt w/ long history of recurrent pancreatitis.  Exact onset unknown.  Patient had GI eval since last visit has been started on Creon.   Plan: 1. Continue GI management of pancreatitis. 2. Continue creon and take this regularly.  Pt verbalizes better understanding of her condition after brief explanation about how chronic pancreatitis is causing her daily pain. 3. Followup as needed.

## 2017-05-22 ENCOUNTER — Other Ambulatory Visit: Payer: Medicare Other

## 2017-06-01 DIAGNOSIS — I10 Essential (primary) hypertension: Secondary | ICD-10-CM | POA: Diagnosis not present

## 2017-06-01 DIAGNOSIS — L03019 Cellulitis of unspecified finger: Secondary | ICD-10-CM | POA: Diagnosis not present

## 2017-06-01 DIAGNOSIS — Z950 Presence of cardiac pacemaker: Secondary | ICD-10-CM | POA: Diagnosis not present

## 2017-07-20 ENCOUNTER — Telehealth: Payer: Self-pay | Admitting: Internal Medicine

## 2017-07-20 ENCOUNTER — Telehealth: Payer: Self-pay | Admitting: Nurse Practitioner

## 2017-07-20 ENCOUNTER — Telehealth: Payer: Self-pay | Admitting: Gastroenterology

## 2017-07-20 NOTE — Telephone Encounter (Signed)
Please call patient regarding her pancreatitis and cysts.

## 2017-07-20 NOTE — Telephone Encounter (Signed)
Spoke with patient, she has some confusion on the information she received at her last visit.  She went to [facility] for a while and she is back now.  I scheduled appt for her to come in on 2/18 due to transportation conflict.

## 2017-07-20 NOTE — Telephone Encounter (Signed)
Pt said when she went to GI, she was told she has cysts on her kidneys.  She needs a referral to a kidney doctor.  Her call back number is [phone]

## 2017-07-20 NOTE — Telephone Encounter (Signed)
New Message   Patient is calling back to see about having her echocardiogram rescheduled. The appointment in the past has been cancelled and rescheduled several times. Please call to discuss.

## 2017-07-20 NOTE — Telephone Encounter (Signed)
Attempted to contact the pt, no answer. LMOM to return my call.  

## 2017-07-21 ENCOUNTER — Emergency Department
Admission: EM | Admit: 2017-07-21 | Discharge: 2017-07-21 | Disposition: A | Discharge status: Home / Self Care | Payer: Medicare Other | Attending: Emergency Medicine | Admitting: Emergency Medicine

## 2017-07-21 DIAGNOSIS — F17210 Nicotine dependence, cigarettes, uncomplicated: Secondary | ICD-10-CM | POA: Diagnosis not present

## 2017-07-21 DIAGNOSIS — I509 Heart failure, unspecified: Secondary | ICD-10-CM | POA: Diagnosis not present

## 2017-07-21 DIAGNOSIS — F319 Bipolar disorder, unspecified: Secondary | ICD-10-CM | POA: Insufficient documentation

## 2017-07-21 DIAGNOSIS — K861 Other chronic pancreatitis: Secondary | ICD-10-CM | POA: Insufficient documentation

## 2017-07-21 DIAGNOSIS — Z950 Presence of cardiac pacemaker: Secondary | ICD-10-CM | POA: Diagnosis not present

## 2017-07-21 DIAGNOSIS — R109 Unspecified abdominal pain: Secondary | ICD-10-CM | POA: Diagnosis not present

## 2017-07-21 DIAGNOSIS — I130 Hypertensive heart and chronic kidney disease with heart failure and stage 1 through stage 4 chronic kidney disease, or unspecified chronic kidney disease: Secondary | ICD-10-CM | POA: Diagnosis not present

## 2017-07-21 DIAGNOSIS — Z8543 Personal history of malignant neoplasm of ovary: Secondary | ICD-10-CM | POA: Diagnosis not present

## 2017-07-21 DIAGNOSIS — N189 Chronic kidney disease, unspecified: Secondary | ICD-10-CM | POA: Diagnosis not present

## 2017-07-21 DIAGNOSIS — Z79899 Other long term (current) drug therapy: Secondary | ICD-10-CM | POA: Insufficient documentation

## 2017-07-21 DIAGNOSIS — R1012 Left upper quadrant pain: Secondary | ICD-10-CM | POA: Diagnosis present

## 2017-07-21 LAB — COMPREHENSIVE METABOLIC PANEL
ALBUMIN: 3.9 g/dL (ref 3.5–5.0)
ALK PHOS: 75 U/L (ref 38–126)
ALT: 21 U/L (ref 14–54)
ANION GAP: 8 (ref 5–15)
AST: 25 U/L (ref 15–41)
BUN: 21 mg/dL — ABNORMAL HIGH (ref 6–20)
CALCIUM: 9.8 mg/dL (ref 8.9–10.3)
CO2: 24 mmol/L (ref 22–32)
Chloride: 107 mmol/L (ref 101–111)
Creatinine, Ser: 1.07 mg/dL — ABNORMAL HIGH (ref 0.44–1.00)
GFR calc non Af Amer: 52 mL/min — ABNORMAL LOW (ref >60)
GLUCOSE: 97 mg/dL (ref 65–99)
POTASSIUM: 4.1 mmol/L (ref 3.5–5.1)
SODIUM: 139 mmol/L (ref 135–145)
Total Bilirubin: 0.5 mg/dL (ref 0.3–1.2)
Total Protein: 7.5 g/dL (ref 6.5–8.1)

## 2017-07-21 LAB — CBC
HEMATOCRIT: 43.2 % (ref 35.0–47.0)
HEMOGLOBIN: 14.3 g/dL (ref 12.0–16.0)
MCH: 29 pg (ref 26.0–34.0)
MCHC: 33.1 g/dL (ref 32.0–36.0)
MCV: 87.4 fL (ref 80.0–100.0)
Platelets: 221 10*3/uL (ref 150–440)
RBC: 4.94 MIL/uL (ref 3.80–5.20)
RDW: 13.8 % (ref 11.5–14.5)
WBC: 8.1 10*3/uL (ref 3.6–11.0)

## 2017-07-21 LAB — TROPONIN I
TROPONIN I: 0.06 ng/mL — AB (ref <0.03)
TROPONIN I: 0.06 ng/mL — AB (ref <0.03)

## 2017-07-21 LAB — LIPASE, BLOOD: LIPASE: 45 U/L (ref 11–51)

## 2017-07-21 MED ORDER — ONDANSETRON HCL 4 MG PO TABS: 4.0000 mg | ORAL_TABLET | Freq: Three times a day (TID) | ORAL | 0 refills | Status: DC | PRN

## 2017-07-21 MED ORDER — DICYCLOMINE HCL 10 MG/ML IM SOLN
20.0000 mg | Freq: Once | INTRAMUSCULAR | Status: AC
  Administered 2017-07-21: 20 mg via INTRAMUSCULAR
  Filled 2017-07-21: qty 2

## 2017-07-21 MED ORDER — SODIUM CHLORIDE 0.9 % IV BOLUS (SEPSIS)
1000.0000 mL | Freq: Once | INTRAVENOUS | Status: AC
  Administered 2017-07-21: 1000 mL via INTRAVENOUS

## 2017-07-21 MED ORDER — SODIUM CHLORIDE 0.9 % IV BOLUS (SEPSIS): 1000.0000 mL | Freq: Once | INTRAVENOUS | Status: DC

## 2017-07-21 MED ORDER — DICYCLOMINE HCL 20 MG PO TABS: 20.0000 mg | ORAL_TABLET | Freq: Three times a day (TID) | ORAL | 0 refills | Status: DC | PRN

## 2017-07-21 MED ORDER — ONDANSETRON HCL 4 MG/2ML IJ SOLN
4.0000 mg | Freq: Once | INTRAMUSCULAR | Status: AC
  Administered 2017-07-21: 4 mg via INTRAVENOUS
  Filled 2017-07-21: qty 2

## 2017-07-21 NOTE — ED Triage Notes (Signed)
Pt came to Ed via pov. Reports chronic pancreatitis. C/o pain in left side of abdomen. Last CT done in September per patient. Pt c/o nausea, no vomiting.

## 2017-07-21 NOTE — ED Provider Notes (Signed)
[facility] Emergency Department Provider Note   ____________________________________________   I have reviewed the triage vital signs and the nursing notes.   HISTORY  Chief Complaint Pancreatitis   History limited by: Not Limited   HPI Lori Orr is a 67 y.o. female who presents to the emergency department today because of concerns for abdominal pain and chronic pancreatitis.  She states pain is located in the left upper quadrant and epigastric region.  It started this morning around 5 AM.  It was accompanied by nausea and vomiting.  She describes the pain as severe.  It is similar to her previous episodes of pancreatitis.  She denies any change in her chest today.  She denies any fevers.  Per medical record review patient has a history of pancreatitis  Past Medical History:  Diagnosis Date  . Bradycardia   . Cancer ([place])    ovarian cancer  oophrectomy w/ only "1/4 ovary remaining"  . CHF (congestive heart failure) ([place])   . Chronic kidney disease   . Depression   . Hypertension   . Pancreatitis   . SSS (sick sinus syndrome) ([place])    PPM implant 03/08/11 - [employer] [phone] - SN [id number]    Patient Active Problem List   Diagnosis Date Noted  . Psychophysiological insomnia 05/19/2017  . Bipolar disorder, current episode mixed, moderate ([place]) 04/17/2017  . Recurrent pancreatitis ([place]) 04/17/2017  . Ready to quit smoking 04/17/2017  . Atypical chest pain 02/25/2017  . Abnormal EKG 02/25/2017  . Cardiac pacemaker in situ 03/29/2016  . History of malignant neoplasm of cervix 03/29/2016  . Hypertensive disorder 03/29/2016  . Kidney stone 03/29/2016  . Multiple nodules of lung 03/29/2016  . Acute pyelonephritis 07/26/2014    Past Surgical History:  Procedure Laterality Date  . ABDOMINAL HYSTERECTOMY    . APPENDECTOMY    . PACEMAKER IMPLANT  03/08/2011   [employer] [id number] [phone]) - Implanted For SSS    Prior to Admission  medications   Medication Sig Start Date End Date Taking? Authorizing Provider  amLODipine (NORVASC) 10 MG tablet Take 1 tablet (10 mg total) daily by mouth. 04/17/17   Mikey College, NP  buPROPion (WELLBUTRIN XL) 300 MG 24 hr tablet Take 1 tablet (300 mg total) by mouth daily. 05/15/17   Mikey College, NP  diclofenac sodium (VOLTAREN) 1 % GEL Apply 2 g 4 (four) times daily as needed topically (moderate wrist pain). Patient not taking: Reported on 05/15/2017 04/18/17   Mikey College, NP  ibuprofen (ADVIL,MOTRIN) 600 MG tablet Take 1 tablet (600 mg total) every 8 (eight) hours as needed by mouth. 05/01/17   Sable Feil, PA-C  lisinopril (PRINIVIL,ZESTRIL) 40 MG tablet Take 1 tablet (40 mg total) by mouth daily. 04/10/17   Deboraha Sprang, MD  metoprolol succinate (TOPROL-XL) 100 MG 24 hr tablet Take 1 tablet (100 mg total) by mouth daily. 04/10/17   Deboraha Sprang, MD  QUEtiapine (SEROQUEL) 50 MG tablet Take 0.5-1 tablets (25-50 mg total) by mouth at bedtime. 05/15/17   Mikey College, NP  traMADol (ULTRAM) 50 MG tablet Take 1 tablet (50 mg total) daily as needed by mouth. Patient not taking: Reported on 05/15/2017 04/17/17   Mikey College, NP  traMADol (ULTRAM) 50 MG tablet Take 1 tablet (50 mg total) every 6 (six) hours as needed by mouth for moderate pain. Patient not taking: Reported on 05/15/2017 05/01/17   Sable Feil, PA-C  Allergies Sulfa antibiotics; Acetaminophen; and Trazodone and nefazodone  Family History  Problem Relation Age of Onset  . Dementia Mother   . Heart attack Father        Died age 77  . Heart disease Sister        Congential  . Heart disease Sister        "Some kind of heart surgery."  . Fibromyalgia Daughter     Social History Social History   Tobacco Use  . Smoking status: Current Every Day Smoker    Packs/day: 1.00    Years: 50.00    Pack years: 50.00    Types: Cigarettes  . Smokeless tobacco: Never Used   Substance Use Topics  . Alcohol use: No  . Drug use: No    Review of Systems Constitutional: No fever/chills Eyes: No visual changes. ENT: No sore throat. Cardiovascular: Denies chest pain. Respiratory: Denies shortness of breath. Gastrointestinal: No abdominal pain.  No nausea, no vomiting.  No diarrhea.   Genitourinary: Negative for dysuria. Musculoskeletal: Negative for back pain. Skin: Negative for rash. Neurological: Negative for headaches, focal weakness or numbness. ____________________________________________   PHYSICAL EXAM:  VITAL SIGNS: ED Triage Vitals  Enc Vitals Group     BP 07/21/17 1618 136/79     Pulse Rate 07/21/17 1618 67     Resp 07/21/17 1618 18     Temp 07/21/17 1618 97.8 F (36.6 C)     Temp Source 07/21/17 1618 Oral     SpO2 07/21/17 1618 96 %     Weight 07/21/17 1617 127 lb (57.6 kg)     Height 07/21/17 1617 5\' 2"  (1.575 m)   Constitutional: Alert and oriented. Well appearing and in no distress. Eyes: Conjunctivae are normal.  ENT   Head: Normocephalic and atraumatic.   Nose: No congestion/rhinnorhea.   Mouth/Throat: Mucous membranes are moist.   Neck: No stridor. Hematological/Lymphatic/Immunilogical: No cervical lymphadenopathy. Cardiovascular: Normal rate, regular rhythm.  No murmurs, rubs, or gallops.  Respiratory: Normal respiratory effort without tachypnea nor retractions. Breath sounds are clear and equal bilaterally. No wheezes/rales/rhonchi. Gastrointestinal: Soft and minimally tender in the left upper quadrant. No rebound. No guarding.  Genitourinary: Deferred Musculoskeletal: Normal range of motion in all extremities. No lower extremity edema. Neurologic:  Normal speech and language. No gross focal neurologic deficits are appreciated.  Skin:  Skin is warm, dry and intact. No rash noted. Psychiatric: Mood and affect are normal. Speech and behavior are normal. Patient exhibits appropriate insight and  judgment.  ____________________________________________    LABS (pertinent positives/negatives)  Trop 0.06 x 2 Lipase 45 CBC wnl CMP wnl cr 1.07, bun 21  ____________________________________________   EKG  I, Nance Pear, attending physician, personally viewed and interpreted this EKG  EKG Time: 2103 Rate: 60 Rhythm: atrial paced rhythm Axis: normal Intervals: qtc 474 QRS: narrow, q waves v1, v2 ST changes: no st elevation Impression: abnormal ekg   ____________________________________________    RADIOLOGY  None   ____________________________________________   PROCEDURES  Procedures  ____________________________________________   INITIAL IMPRESSION / ASSESSMENT AND PLAN / ED COURSE  Pertinent labs & imaging results that were available during my care of the patient were reviewed by me and considered in my medical decision making (see chart for details).  Patient presented to the emergency department today because of concerns for acute on chronic pancreatitis.  Patient's lipase within normal limits.  Patient's troponin was elevated 0.06.  However she denies any chest pain states she has a history  of cardiac disease and this does not feel like it.  Given lack of change of the troponin I doubt that the patient's pain is ACS or cardiac related.  In addition she did feel better after Bentyl.  Did discuss with patient importance of following up with cardiologist however.  She states she is already talking to them about getting an echocardiogram performed.  ____________________________________________   FINAL CLINICAL IMPRESSION(S) / ED DIAGNOSES  Final diagnoses:  Chronic pancreatitis, unspecified pancreatitis type [facility])     Note: This dictation was prepared with Dragon dictation. Any transcriptional errors that result from this process are unintentional     Nance Pear, MD 07/22/17 1554

## 2017-07-21 NOTE — Discharge Instructions (Signed)
Please seek medical attention for any high fevers, chest pain, shortness of breath, change in behavior, persistent vomiting, bloody stool or any other new or concerning symptoms.  

## 2017-07-21 NOTE — ED Notes (Signed)
Pt presents with chronic pancreatisis. Pt states she is n/v/d for 1 day. Pt is NAD at this time.

## 2017-07-21 NOTE — ED Notes (Signed)
Pt called by triage nurse at this time with no answer

## 2017-07-24 ENCOUNTER — Other Ambulatory Visit: Payer: Self-pay

## 2017-07-24 ENCOUNTER — Encounter: Payer: Self-pay | Admitting: Nurse Practitioner

## 2017-07-24 ENCOUNTER — Ambulatory Visit (INDEPENDENT_AMBULATORY_CARE_PROVIDER_SITE_OTHER): Payer: Medicare Other | Admitting: Nurse Practitioner

## 2017-07-24 VITALS — BP 118/62 | HR 60 | Temp 98.3°F | Ht 62.0 in | Wt 129.4 lb

## 2017-07-24 DIAGNOSIS — N281 Cyst of kidney, acquired: Secondary | ICD-10-CM

## 2017-07-24 DIAGNOSIS — R0789 Other chest pain: Secondary | ICD-10-CM | POA: Diagnosis not present

## 2017-07-24 DIAGNOSIS — K861 Other chronic pancreatitis: Secondary | ICD-10-CM | POA: Diagnosis not present

## 2017-07-24 DIAGNOSIS — F3162 Bipolar disorder, current episode mixed, moderate: Secondary | ICD-10-CM

## 2017-07-24 DIAGNOSIS — F5104 Psychophysiologic insomnia: Secondary | ICD-10-CM

## 2017-07-24 MED ORDER — QUETIAPINE FUMARATE 100 MG PO TABS: 100.0000 mg | ORAL_TABLET | Freq: Every day | ORAL | 5 refills | Status: DC

## 2017-07-24 MED ORDER — PANCRELIPASE (LIP-PROT-AMYL) 6000-19000 UNITS PO CPEP: 1.0000 | ORAL_CAPSULE | Freq: Three times a day (TID) | ORAL | 0 refills | Status: DC

## 2017-07-24 NOTE — Telephone Encounter (Signed)
Pt is calling back regarding scheduling her echocardiogram. She states she has been awaiting a call for this "for days now". Please call to schedule.

## 2017-07-24 NOTE — Patient Instructions (Addendum)
[place], Thank you for coming in to clinic today.  1. PSYCHIATRY / [place] Self Referral [facility]) [place] [address] [place], [place] [address] Phone: [phone] - Call to make appointment  [facility], available by walk-in only 9am-4pm M-F for initial appointment. [place], [place] [address] Hours: 9am - 4pm (M-F, walk in available) [phone]  CONTINUE Wellbutrin INCREASE seroquel  2. Kidney doctors will call you to schedule your followup for your kidney cysts.   3. Continue followup with GI for your chronic pancreatitis. START Creon 1 capsule three times daily with meals.  4. For your eye twitches, take a multivitamin.  Occasionally, these are caused by nutrient deficiencies (low).  Take a multivitamin.  See an optometrist if they continue.  Your provider would like to you have your annual eye exam. Please contact your current eye doctor or here are some good options for you to contact.   [facility]   Address: [address] [place], [place] [address] Phone: [phone]  Website: [email or web address] [address] [place], [place] [address] Phone: [phone] [email or web address]  [facility]  Address: [place], [place], [place] [address] Phone: [phone]   [facility]) [address] [place], [place] [address] Phone: [phone]  [facility] Address: [place], [place], [place] [address]  Phone: [phone]  Please schedule a follow-up appointment with Cassell Smiles, AGNP. Return in about 2 months (around 09/21/2017) for Bipolar, pancreatitis, kidney cysts.  If you have any other questions or concerns, please feel free to call the clinic or send a message through [place]. You may also schedule an earlier appointment if necessary.  You will receive a survey after today's visit either digitally by e-mail or paper by [employer]. Your experiences and feedback matter to [place].  Please respond so we know how we are doing as we provide care for you.   Cassell Smiles, DNP, AGNP-BC Adult Gerontology Nurse Practitioner [place]

## 2017-07-24 NOTE — Assessment & Plan Note (Signed)
Currently moderate symptoms w/ regular tearfulness and hyperactivity/verbal sharing, insomnia.  Pt admits she needs more or different medication, psychiatry referral, and remains committed to caring for herself.  Plan: 1. Continue bupropion ER 300 mg once daily 2. INCREASE seroquel to 100 mg tablet once daily at bedtime.   2. Recommend pt self-refer to [facility] for [facility] treatment. 3. Followup 2 months and as needed.

## 2017-07-24 NOTE — Progress Notes (Signed)
Subjective:    Patient ID: Lori Orr, female    DOB: 11-25-1950, 67 y.o.   MRN: [id number]  Lori Orr is a 67 y.o. female presenting on 07/24/2017 for Manic Behavior   HPI  ED visit Interval history: Pancreatitis visit to ED on 07/21/2017 - Scheduled visit for followup with Dr. Marius Ditch 07/31/17.  Pt continues to complain of intermittent abdominal pain.  Is not continuing to take Creon as provided by Dr. Marius Ditch at last visit.  Needs refill.  Chest pain/Sick Sinus Syndrome Was told her EKG at ED indicates she has had "heart damage."  Has not had completed cardiac workup as requested prior to her departure to [place].  She has tried to call [facility] without success for scheduling.  She has been told she "will get a return phone call" for appointment - Echocardiogram is what pt believes she needs.  Pt had mildly elevated, stable troponin I during ED visit 07/21/2017.  Renal Cysts bilateral/Nodule R adrenal gland Pt has not had any followup on these nodules/cysts.  She now has pacemaker and may not be candidate for MRI as recommended in last CT scan report by Radiology.  Pt requests referral to nephrology for discussion about followup and necessary treatment.  Nodules previously stable from 01/27/2017-02/25/2017.   Bipolar Disorder/Manic Behavior Pt reports minimal improvement of symptoms on wellbutrin and seroquel.  Pt is only taking 25 mg seroquel nightly and did not get followup when she went to [facility] after last visit.  Pt has now returned to [facility] with plans to take care of herself and become stable with her chronic conditions. - Most significant symptoms are poor sleep, mania behaviors, constantly high energy.  Pt also reports significant tearfulness/ crying all the time.  Has taken depakote and effexor in past in [place] when she felt the best with her bipolar.  Feels she didn't want to go back because she didn't wnt to try different medications.  Was concerned for side effects.  Now  is willing to establish care with psychiatry.  Social History   Tobacco Use  . Smoking status: Current Every Day Smoker    Packs/day: 1.00    Years: 50.00    Pack years: 50.00    Types: Cigarettes  . Smokeless tobacco: Never Used  Substance Use Topics  . Alcohol use: No  . Drug use: No    Review of Systems Per HPI unless specifically indicated above     Objective:    BP 118/62 (BP Location: Right Arm, Patient Position: Sitting, Cuff Size: Normal)   Pulse 60   Temp 98.3 F (36.8 C) (Oral)   Ht 5\' 2"  (1.575 m)   Wt 129 lb 6.4 oz (58.7 kg)   BMI 23.67 kg/m   Wt Readings from Last 3 Encounters:  07/24/17 129 lb 6.4 oz (58.7 kg)  07/21/17 127 lb (57.6 kg)  05/15/17 123 lb 3.2 oz (55.9 kg)    Physical Exam  General - healthy, well-appearing, NAD HEENT - Normocephalic, atraumatic Neck - supple, non-tender, no LAD Heart - RRR, no murmurs heard Lungs - Clear throughout all lobes, no wheezing, crackles, or rhonchi. Normal work of breathing. Abdomen - soft, diffuse abdominal tenderness worst in LUQ, epigastric region.  Mildly distended, no masses, no hepatosplenomegaly, active bowel sounds.  Positive CVAT Left side Extremeties - non-tender, no edema, cap refill < 2 seconds, peripheral pulses intact +2 bilaterally Skin - warm, dry Neuro - awake, alert, oriented x3, normal gait Psych - Anxious mood and affect,  flight of ideas, tangential thoughts.  Could be redirected.   Results for orders placed or performed during the hospital encounter of 07/21/17  Lipase, blood  Result Value Ref Range   Lipase 45 11 - 51 U/L  Comprehensive metabolic panel  Result Value Ref Range   Sodium 139 135 - 145 mmol/L   Potassium 4.1 3.5 - 5.1 mmol/L   Chloride 107 101 - 111 mmol/L   CO2 24 22 - 32 mmol/L   Glucose, Bld 97 65 - 99 mg/dL   BUN 21 (H) 6 - 20 mg/dL   Creatinine, Ser 1.07 (H) 0.44 - 1.00 mg/dL   Calcium 9.8 8.9 - 10.3 mg/dL   Total Protein 7.5 6.5 - 8.1 g/dL   Albumin 3.9 3.5  - 5.0 g/dL   AST 25 15 - 41 U/L   ALT 21 14 - 54 U/L   Alkaline Phosphatase 75 38 - 126 U/L   Total Bilirubin 0.5 0.3 - 1.2 mg/dL   GFR calc non Af Amer 52 (L) >60 mL/min   GFR calc Af Amer >60 >60 mL/min   Anion gap 8 5 - 15  CBC  Result Value Ref Range   WBC 8.1 3.6 - 11.0 K/uL   RBC 4.94 3.80 - 5.20 MIL/uL   Hemoglobin 14.3 12.0 - 16.0 g/dL   HCT 43.2 35.0 - 47.0 %   MCV 87.4 80.0 - 100.0 fL   MCH 29.0 26.0 - 34.0 pg   MCHC 33.1 32.0 - 36.0 g/dL   RDW 13.8 11.5 - 14.5 %   Platelets 221 150 - 440 K/uL  Troponin I  Result Value Ref Range   Troponin I 0.06 (HH) <0.03 ng/mL  Troponin I  Result Value Ref Range   Troponin I 0.06 (HH) <0.03 ng/mL      Assessment & Plan:   Problem List Items Addressed This Visit      Other   Atypical chest pain Pt lost to followup from last cardiac workup.  Is attempting [facility] contact [facility] for her [facility] to continue workup without success.    Plan: 1. Referral back to [facility] - Dr. [name]. 2. Continue current medications and followup as needed.    Bipolar disorder, current episode mixed, moderate ([facility])    Currently moderate symptoms w/ regular tearfulness and hyperactivity/verbal sharing, insomnia.  Pt admits she needs more or different medication, psychiatry referral, and remains committed to caring for herself.  Plan: 1. Continue bupropion ER 300 mg once daily 2. INCREASE seroquel to 100 mg tablet once daily at bedtime.   2. Recommend pt self-refer to [facility] for [facility] treatment. 3. Followup 2 months and as needed.      Relevant Medications   QUEtiapine (SEROQUEL) 100 MG tablet   Psychophysiological insomnia   Relevant Medications   QUEtiapine (SEROQUEL) 100 MG tablet    Other Visit Diagnoses    Other chronic pancreatitis ([facility])    -  Primary Pt with continued recurrent pancreatitis.  Continues to have pain.  Did not continue on Creon.  Will provide single 30-day supply to bridge to next GI  appointment.  START taking one tablet Creon 6000 units tidwc.  Await further instructions from Dr. Marius Ditch.  Encouraged pt to followup with her for any additional pain medications.    Relevant Medications   Pancrelipase, Lip-Prot-Amyl, 6000 units CPEP   Acquired bilateral renal cysts     Pt without followup to date.  Now requests referral to nephrology.   Plan: 1.  Considered MRI, but pt has implanted pacemaker.  Will defer additional imaging to nephrology. 2. Referral to [facility].   Relevant Orders   Ambulatory referral to Nephrology      Meds ordered this encounter  Medications  . Pancrelipase, Lip-Prot-Amyl, 6000 units CPEP    Sig: Take 1 capsule (6,000 Units total) by mouth 3 (three) times daily with meals.    Dispense:  120 capsule    Refill:  0    Order Specific Question:   Supervising Provider    Answer:   Olin Hauser [[id number]]  . QUEtiapine (SEROQUEL) 100 MG tablet    Sig: Take 1 tablet (100 mg total) by mouth at bedtime.    Dispense:  30 tablet    Refill:  5    Order Specific Question:   Supervising Provider    Answer:   Olin Hauser [[id number]]    Follow up plan: Return in about 2 months (around 09/21/2017) for Bipolar, pancreatitis, kidney cysts.  A total of 40 minutes was spent face-to-face with this patient. Greater than 50% of this time was spent in counseling and coordination of care with the patient.  Lori Smiles, DNP, AGPCNP-BC Adult Gerontology Primary Care Nurse Practitioner [place] [facility] 07/24/2017, 1:32 PM

## 2017-07-26 ENCOUNTER — Other Ambulatory Visit: Payer: Self-pay | Admitting: Internal Medicine

## 2017-07-26 DIAGNOSIS — R0600 Dyspnea, unspecified: Secondary | ICD-10-CM

## 2017-07-26 DIAGNOSIS — R0609 Other forms of dyspnea: Principal | ICD-10-CM

## 2017-07-31 ENCOUNTER — Encounter: Payer: Self-pay | Admitting: Gastroenterology

## 2017-07-31 ENCOUNTER — Ambulatory Visit (INDEPENDENT_AMBULATORY_CARE_PROVIDER_SITE_OTHER): Payer: Medicare Other | Admitting: Gastroenterology

## 2017-07-31 VITALS — BP 145/86 | HR 60 | Temp 97.6°F | Ht 62.0 in | Wt 130.0 lb

## 2017-07-31 DIAGNOSIS — K861 Other chronic pancreatitis: Secondary | ICD-10-CM

## 2017-07-31 MED ORDER — TRAMADOL HCL 50 MG PO TABS: 50.0000 mg | ORAL_TABLET | Freq: Four times a day (QID) | ORAL | 0 refills | Status: DC | PRN

## 2017-07-31 NOTE — Progress Notes (Signed)
Lori Darby, MD [address]  [employer]  [place], [place] [address]  Main: [phone]  Fax: [phone]    Gastroenterology Consultation  Referring Provider:     Mikey College, * Primary Care Physician:  Mikey College, NP Primary Gastroenterologist:  Dr. Cephas Orr Reason for Consultation:    Recurrent acute pancreatitis        HPI:   Lori Orr is a 67 y.o. female referred by Dr. Merrilyn Puma, Jerrel Ivory, NP  for consultation & management of recurrent acute pancreatitis. She has h/o chronic tobacco use, status post pacemaker for sick sinus syndrome, CHF, CKD, hypertension, history of ovarian cancer.  She had several ER visits in the last 1 and half year secondary to acute episodes of epigastric pain associated with nausea, vomiting and elevated lipase.  Her lipase was elevated to more than 3 times upper limit of normal on separate occasions.  She had cross-sectional imaging in 01/30/17 and 03/02/17 which did not reveal evidence of cholelithiasis or choledocholithiasis.  Most recent imaging from 02/25/2017 revealed mild pancreatic ductal dilation and stones in the pancreatic duct are adjacent to the pancreatic duct and associated calcifications in the pancreatic parenchyma.  She is upset today that she is not aware of the CT findings from last imaging.  She spends most of her time in [place] and planning to go to [place] next week to take care of her boyfriend who is dealing with cancer. Patient reports that she was admitted at [facility] previously for acute pancreatitis and she saw a pancreas specialist who performed EUS.  She is not aware of the findings.  She has chronic epigastric pain associated with bloating and intermittent nonbloody diarrhea.  She continues to smoke tobacco.  She denies weight loss, nausea, vomiting.  She denies fever, chills, constipation.  Follow-up visit 07/31/2017: She just returned from [place] on 07/14/2017, read to ER on  07/21/2017 secondary to flare up of chronic pancreatitis. Her lipase was normal. She was discharged on dicyclomine. Patient is here today requesting for pain medication. She says that she used to get tramadol from her family doctor but no longer. She is planning to go back to [place] in April this year. She continues to smoke  NSAIDs: Ibuprofen as needed  Antiplts/Anticoagulants/Anti thrombotics: None  GI Procedures:  EGD 08/20/2013 at [facility]: Normal proximal third of the esophagus, middle third of the  esophagus, and distal third of the esophagus.The GE junction was  visualized.Erythematous mucosa was found in the antrum.Erosions found  in the mid body of the stomach (535.4).Normal duodenal bulb, 1st portion  of the duodenum, and 2nd portion of the duodenum. STOMACH (BIOPSY):ANTRAL AND OXYNTIC MUCOSA WITH FOCAL ACUTE CRYPTITIS.NO GRANULOMATOUS INFLAMMATION.NO HELICOBACTER PYLORI ORGANISMS ARE IDENTIFIED ON DIFF-QUIK STAIN.SEE NOTE.   NOTE:A PAS/AB STAIN WAS REVIEWED FOR THIS SPECIMEN.   NOTE:THE DIFFERENTIAL DIAGNOSIS INCLUDES INFLAMMATORY BOWEL DISEASE, DRUG REACTION (I.E., NSAIDS) OR OTHER NONSPECIFIC REACTION.  GIVEN THE HISTORY OF A KIDNEY STONE, THIS MAY BE A REACTION TO NSAIDS OR OTHER MEDICATION.NO OTHER SIGNS OF IBD ARE NOTED.GRANULOMATOUS INFLAMMATION IS ABSENT.   Colonoscopy 08/20/2013 at [facility] - Two subcentimeter sessile polyps in the transverse colon,  removed - 5 mm sessile polyp in the sigmoid colon, removed - Mild  diverticulosis, ascending and sigmoid colon - Small internal hemorrhoids 1)COLON, TRANSVERSE, POLYP (BIOPSY #1):COLONIC MUCOSA WITH  NONDIAGNOSTIC FINDINGS.   2)COLON, TRANSVERSE, POLYP (BIOPSY #2):TUBULAR ADENOMA.   3)COLON, SIGMOIDPOLYP (BIOPSY):HYPERPLASTIC POLYP.  Past Medical History:  Diagnosis Date  . Bradycardia   . Cancer ([place])    ovarian cancer  oophrectomy w/ only "1/4 ovary  remaining"  . CHF (congestive heart failure) ([place])   . Chronic kidney disease   . Depression   . Hypertension   . Pancreatitis   . SSS (sick sinus syndrome) ([place])    PPM implant 03/08/11 [facility] [employer] [id number] - SN [id number]    Past Surgical History:  Procedure Laterality Date  . ABDOMINAL HYSTERECTOMY    . APPENDECTOMY    . PACEMAKER IMPLANT  03/08/2011   [employer] [id number] [phone]) - Implanted For SSS     Current Outpatient Medications:  .  amLODipine (NORVASC) 10 MG tablet, Take 1 tablet (10 mg total) daily by mouth., Disp: 90 tablet, Rfl: 1 .  buPROPion (WELLBUTRIN XL) 300 MG 24 hr tablet, Take 1 tablet (300 mg total) by mouth daily., Disp: 90 tablet, Rfl: 1 .  diclofenac sodium (VOLTAREN) 1 % GEL, Apply 2 g 4 (four) times daily as needed topically (moderate wrist pain)., Disp: 100 g, Rfl: 2 .  ibuprofen (ADVIL,MOTRIN) 600 MG tablet, Take 1 tablet (600 mg total) every 8 (eight) hours as needed by mouth., Disp: 15 tablet, Rfl: 0 .  lisinopril (PRINIVIL,ZESTRIL) 40 MG tablet, Take 1 tablet (40 mg total) by mouth daily., Disp: 90 tablet, Rfl: 3 .  metoprolol succinate (TOPROL-XL) 100 MG 24 hr tablet, Take 1 tablet (100 mg total) by mouth daily., Disp: 90 tablet, Rfl: 3 .  ondansetron (ZOFRAN) 4 MG tablet, Take 1 tablet (4 mg total) by mouth every 8 (eight) hours as needed for nausea or vomiting., Disp: 20 tablet, Rfl: 0 .  Pancrelipase, Lip-Prot-Amyl, 6000 units CPEP, Take 1 capsule (6,000 Units total) by mouth 3 (three) times daily with meals., Disp: 120 capsule, Rfl: 0 .  QUEtiapine (SEROQUEL) 100 MG tablet, Take 1 tablet (100 mg total) by mouth at bedtime., Disp: 30 tablet, Rfl: 5 .  dicyclomine (BENTYL) 20 MG tablet, Take 1 tablet (20 mg total) by mouth 3 (three) times daily as needed (abdominal pain). (Patient not taking: Reported on 07/31/2017), Disp: 30 tablet, Rfl: 0 .  traMADol (ULTRAM) 50 MG tablet, Take 1 tablet (50 mg total) by mouth every 6 (six) hours as needed for up  to 20 doses for moderate pain or severe pain., Disp: 20 tablet, Rfl: 0  Family History  Problem Relation Age of Onset  . Dementia Mother   . Heart attack Father        Died age 16  . Heart disease Sister        Congential  . Heart disease Sister        "Some kind of heart surgery."  . Fibromyalgia Daughter      Social History   Tobacco Use  . Smoking status: Current Every Day Smoker    Packs/day: 1.00    Years: 50.00    Pack years: 50.00    Types: Cigarettes  . Smokeless tobacco: Never Used  Substance Use Topics  . Alcohol use: No  . Drug use: No    Allergies as of 07/31/2017 - Review Complete 07/31/2017  Allergen Reaction Noted  . Sulfa antibiotics Rash 10/07/2016  . Acetaminophen  01/29/2016  . Trazodone and nefazodone Other (See Comments) 04/17/2017    Review of Systems:    All systems reviewed and negative except where noted in HPI.   Physical Exam:  BP (!) 145/86   Pulse 60   Temp 97.6  F (36.4 C) (Oral)   Ht 5\' 2"  (1.575 m)   Wt 130 lb (59 kg)   BMI 23.78 kg/m  No LMP recorded. Patient has had a hysterectomy.  General:   Alert,  Well-developed, well-nourished, pleasant and cooperative in NAD Head:  Normocephalic and atraumatic. Eyes:  Sclera clear, no icterus.   Conjunctiva pink. Ears:  Normal auditory acuity. Nose:  No deformity, discharge, or lesions. Mouth:  No deformity or lesions,oropharynx pink & moist. Neck:  Supple; no masses or thyromegaly. Lungs:  Respirations even and unlabored.  Clear throughout to auscultation.   No wheezes, crackles, or rhonchi. No acute distress. Heart:  Regular rate and rhythm; no murmurs, clicks, rubs, or gallops. Abdomen:  Normal bowel sounds. Soft, mild epigastric tenderness, nondistended, tympanic without masses, hepatosplenomegaly or hernias noted.  No guarding or rebound tenderness.   Rectal: Nor performed Msk:  Symmetrical without gross deformities. Good, equal movement & strength bilaterally. Pulses:  Normal  pulses noted. Extremities:  No clubbing or edema.  No cyanosis. Neurologic:  Alert and oriented x3;  grossly normal neurologically. Skin:  Intact without significant lesions or rashes. No jaundice. Lymph Nodes:  No significant cervical adenopathy. Psych:  Alert and cooperative. Normal mood and affect.  Imaging Studies: Reviewed  Assessment and Plan:   Lori Orr is a 67 y.o. female with chronic tobacco use, recurrent acute pancreatitis presents for further evaluation. Her CT findings are highly suggestive of chronic pancreatitis which she is at risk given her long smoking history.  She probably might need extraction of the stone and pancreatic duct stent placement to prevent recurrent attacks of acute pancreatitis.  She will need to see advanced endoscopist who does EUS +/- pancreatic duct stent placement I discussed with her that I do not perform these and recommended to [facility] or [facility].  She preferred to be referred to [facility] advanced endoscopy  - I will give her tramadol 20 tablets in the interim for pain secondary to chronic pancreatitis - She is due for next surveillance colonoscopy in 2020 - Refer to [facility] advanced endoscopy   Follow up as needed   Lori Darby, MD

## 2017-08-07 ENCOUNTER — Other Ambulatory Visit: Payer: Self-pay

## 2017-08-07 ENCOUNTER — Ambulatory Visit (HOSPITAL_COMMUNITY): Payer: Medicare Other | Attending: Cardiovascular Disease

## 2017-08-07 DIAGNOSIS — R0609 Other forms of dyspnea: Secondary | ICD-10-CM

## 2017-08-07 DIAGNOSIS — R0600 Dyspnea, unspecified: Secondary | ICD-10-CM

## 2017-08-07 DIAGNOSIS — I5030 Unspecified diastolic (congestive) heart failure: Secondary | ICD-10-CM | POA: Insufficient documentation

## 2017-08-09 ENCOUNTER — Telehealth: Payer: Self-pay

## 2017-08-09 MED ORDER — VERAPAMIL HCL ER 240 MG PO TBCR: 240.0000 mg | EXTENDED_RELEASE_TABLET | Freq: Every day | ORAL | 3 refills | Status: DC

## 2017-08-09 NOTE — Telephone Encounter (Signed)
Spoke with pt regarding echo results and medication changes; she stated she did not have issues with constipations, so I sent in her prescription for 240mg  qhs to her pharmacy. She will follow up with Renee in about 6 weeks to discuss medication change.

## 2017-08-09 NOTE — Telephone Encounter (Signed)
-----   Message from Deboraha Sprang, MD sent at 08/07/2017  9:33 PM EST ----- Please Inform Patient Echo showed  Normal and hyperdynamic# heart muscle function    There is also asymmetric septal thickening by echo but I presumed this was evaluated previously at [facility] her amlodipine to verapamil ( if she is not prone to constipation) or diltiazem either at 240 mg might hlep make heart beat not so vigorous -- it is over vigorous

## 2017-08-15 ENCOUNTER — Other Ambulatory Visit: Payer: Self-pay

## 2017-08-15 ENCOUNTER — Emergency Department: Payer: Medicare Other

## 2017-08-15 ENCOUNTER — Inpatient Hospital Stay
Admission: EM | Admit: 2017-08-15 | Discharge: 2017-08-16 | DRG: 069 | Disposition: A | Discharge status: Home / Self Care | Payer: Medicare Other | Attending: Internal Medicine | Admitting: Internal Medicine

## 2017-08-15 ENCOUNTER — Telehealth: Payer: Self-pay | Admitting: Internal Medicine

## 2017-08-15 DIAGNOSIS — Z882 Allergy status to sulfonamides status: Secondary | ICD-10-CM

## 2017-08-15 DIAGNOSIS — I5032 Chronic diastolic (congestive) heart failure: Secondary | ICD-10-CM | POA: Diagnosis not present

## 2017-08-15 DIAGNOSIS — F17210 Nicotine dependence, cigarettes, uncomplicated: Secondary | ICD-10-CM | POA: Diagnosis present

## 2017-08-15 DIAGNOSIS — Z8249 Family history of ischemic heart disease and other diseases of the circulatory system: Secondary | ICD-10-CM | POA: Diagnosis not present

## 2017-08-15 DIAGNOSIS — R778 Other specified abnormalities of plasma proteins: Secondary | ICD-10-CM

## 2017-08-15 DIAGNOSIS — R4701 Aphasia: Secondary | ICD-10-CM | POA: Diagnosis present

## 2017-08-15 DIAGNOSIS — Z950 Presence of cardiac pacemaker: Secondary | ICD-10-CM

## 2017-08-15 DIAGNOSIS — I671 Cerebral aneurysm, nonruptured: Secondary | ICD-10-CM | POA: Diagnosis not present

## 2017-08-15 DIAGNOSIS — Z888 Allergy status to other drugs, medicaments and biological substances status: Secondary | ICD-10-CM

## 2017-08-15 DIAGNOSIS — K861 Other chronic pancreatitis: Secondary | ICD-10-CM | POA: Diagnosis present

## 2017-08-15 DIAGNOSIS — I214 Non-ST elevation (NSTEMI) myocardial infarction: Secondary | ICD-10-CM | POA: Diagnosis not present

## 2017-08-15 DIAGNOSIS — R4781 Slurred speech: Secondary | ICD-10-CM | POA: Diagnosis not present

## 2017-08-15 DIAGNOSIS — Z886 Allergy status to analgesic agent status: Secondary | ICD-10-CM | POA: Diagnosis not present

## 2017-08-15 DIAGNOSIS — R262 Difficulty in walking, not elsewhere classified: Secondary | ICD-10-CM | POA: Diagnosis not present

## 2017-08-15 DIAGNOSIS — E785 Hyperlipidemia, unspecified: Secondary | ICD-10-CM | POA: Diagnosis present

## 2017-08-15 DIAGNOSIS — Z8543 Personal history of malignant neoplasm of ovary: Secondary | ICD-10-CM

## 2017-08-15 DIAGNOSIS — I110 Hypertensive heart disease with heart failure: Secondary | ICD-10-CM | POA: Diagnosis present

## 2017-08-15 DIAGNOSIS — G459 Transient cerebral ischemic attack, unspecified: Principal | ICD-10-CM | POA: Diagnosis present

## 2017-08-15 DIAGNOSIS — R748 Abnormal levels of other serum enzymes: Secondary | ICD-10-CM | POA: Diagnosis not present

## 2017-08-15 DIAGNOSIS — R7989 Other specified abnormal findings of blood chemistry: Secondary | ICD-10-CM

## 2017-08-15 DIAGNOSIS — I6523 Occlusion and stenosis of bilateral carotid arteries: Secondary | ICD-10-CM | POA: Diagnosis not present

## 2017-08-15 DIAGNOSIS — Z90710 Acquired absence of both cervix and uterus: Secondary | ICD-10-CM

## 2017-08-15 LAB — CBC
HEMATOCRIT: 44.1 % (ref 35.0–47.0)
HEMOGLOBIN: 14.6 g/dL (ref 12.0–16.0)
MCH: 28.7 pg (ref 26.0–34.0)
MCHC: 33 g/dL (ref 32.0–36.0)
MCV: 86.9 fL (ref 80.0–100.0)
Platelets: 171 10*3/uL (ref 150–440)
RBC: 5.08 MIL/uL (ref 3.80–5.20)
RDW: 13.7 % (ref 11.5–14.5)
WBC: 6.6 10*3/uL (ref 3.6–11.0)

## 2017-08-15 LAB — DIFFERENTIAL
BASOS ABS: 0.1 10*3/uL (ref 0–0.1)
Basophils Relative: 1 %
EOS PCT: 3 %
Eosinophils Absolute: 0.2 10*3/uL (ref 0–0.7)
LYMPHS PCT: 27 %
Lymphs Abs: 1.8 10*3/uL (ref 1.0–3.6)
Monocytes Absolute: 0.7 10*3/uL (ref 0.2–0.9)
Monocytes Relative: 10 %
NEUTROS PCT: 59 %
Neutro Abs: 3.9 10*3/uL (ref 1.4–6.5)

## 2017-08-15 LAB — COMPREHENSIVE METABOLIC PANEL
ALBUMIN: 4 g/dL (ref 3.5–5.0)
ALK PHOS: 67 U/L (ref 38–126)
ALT: 19 U/L (ref 14–54)
ANION GAP: 9 (ref 5–15)
AST: 24 U/L (ref 15–41)
BUN: 19 mg/dL (ref 6–20)
CO2: 22 mmol/L (ref 22–32)
Calcium: 9.1 mg/dL (ref 8.9–10.3)
Chloride: 108 mmol/L (ref 101–111)
Creatinine, Ser: 0.87 mg/dL (ref 0.44–1.00)
GFR calc Af Amer: >60 mL/min (ref >60)
GFR calc non Af Amer: >60 mL/min (ref >60)
GLUCOSE: 118 mg/dL — AB (ref 65–99)
POTASSIUM: 4.1 mmol/L (ref 3.5–5.1)
SODIUM: 139 mmol/L (ref 135–145)
Total Bilirubin: 0.7 mg/dL (ref 0.3–1.2)
Total Protein: 7.1 g/dL (ref 6.5–8.1)

## 2017-08-15 LAB — PROTIME-INR
INR: 0.89
Prothrombin Time: 12 seconds (ref 11.4–15.2)

## 2017-08-15 LAB — APTT: aPTT: 26 seconds (ref 24–36)

## 2017-08-15 LAB — TROPONIN I: Troponin I: 0.09 ng/mL (ref <0.03)

## 2017-08-15 MED ORDER — ASPIRIN 81 MG PO CHEW
324.0000 mg | CHEWABLE_TABLET | Freq: Once | ORAL | Status: AC
  Administered 2017-08-15: 324 mg via ORAL
  Filled 2017-08-15: qty 4

## 2017-08-15 NOTE — ED Notes (Signed)
Patient transported to CT 

## 2017-08-15 NOTE — Telephone Encounter (Signed)
I would advise neurological evaluation prob in ER

## 2017-08-15 NOTE — Telephone Encounter (Signed)
After reading notes on Lori Orr's symptoms, the device clinic was able to pull her records which showed a history of 40 minutes sustained Atrial Fibrillation around October 2018. Dr [name] advised she go to the ED for an evaluation of TIA based of her symptoms and history. If there are any questions in the ER regarding her history, Dr [name] should be contacted.  Lori Orr agreed she would go to the ER to be seen. She had no additional questions.

## 2017-08-15 NOTE — ED Provider Notes (Signed)
[facility] Emergency Department Provider Note   ____________________________________________   I have reviewed the triage vital signs and the nursing notes.   HISTORY  Chief Complaint Aphasia   History limited by: Not Limited   HPI Lori Orr is a 67 y.o. female who presents to the emergency department today because of concern for an episode of slurred speech. The patient states it occurred this morning while she was on the phone with her boyfriend. She could tell that her words were slurred. She then hung the phone up and laid down. Thinks that the symptom lasted for roughly 6 minutes. The patient also states she noticed she had a hard time walking to the bed. She denies similar symptoms in the past. Denies any chest pain or palpitations. Talked to her physician who was concerned for possible stroke.   Per medical record review patient has a history of CHF.  Past Medical History:  Diagnosis Date  . Bradycardia   . Cancer ([place])    ovarian cancer  oophrectomy w/ only "1/4 ovary remaining"  . CHF (congestive heart failure) ([place])   . Chronic kidney disease   . Depression   . Hypertension   . Pancreatitis   . SSS (sick sinus syndrome) ([place])    PPM implant 03/08/11 - [employer] [phone] - SN [id number]    Patient Active Problem List   Diagnosis Date Noted  . Psychophysiological insomnia 05/19/2017  . Bipolar disorder, current episode mixed, moderate ([place]) 04/17/2017  . Recurrent pancreatitis ([place]) 04/17/2017  . Ready to quit smoking 04/17/2017  . Atypical chest pain 02/25/2017  . Abnormal EKG 02/25/2017  . Cardiac pacemaker in situ 03/29/2016  . History of malignant neoplasm of cervix 03/29/2016  . Hypertensive disorder 03/29/2016  . Kidney stone 03/29/2016  . Multiple nodules of lung 03/29/2016  . Acute pyelonephritis 07/26/2014    Past Surgical History:  Procedure Laterality Date  . ABDOMINAL HYSTERECTOMY    . APPENDECTOMY    . PACEMAKER  IMPLANT  03/08/2011   [employer] [id number] [phone]) - Implanted For SSS    Prior to Admission medications   Medication Sig Start Date End Date Taking? Authorizing Provider  buPROPion (WELLBUTRIN XL) 300 MG 24 hr tablet Take 1 tablet (300 mg total) by mouth daily. 05/15/17   Mikey College, NP  diclofenac sodium (VOLTAREN) 1 % GEL Apply 2 g 4 (four) times daily as needed topically (moderate wrist pain). 04/18/17   Mikey College, NP  dicyclomine (BENTYL) 20 MG tablet Take 1 tablet (20 mg total) by mouth 3 (three) times daily as needed (abdominal pain). Patient not taking: Reported on 07/31/2017 07/21/17   Nance Pear, MD  ibuprofen (ADVIL,MOTRIN) 600 MG tablet Take 1 tablet (600 mg total) every 8 (eight) hours as needed by mouth. 05/01/17   Sable Feil, PA-C  lisinopril (PRINIVIL,ZESTRIL) 40 MG tablet Take 1 tablet (40 mg total) by mouth daily. 04/10/17   Deboraha Sprang, MD  metoprolol succinate (TOPROL-XL) 100 MG 24 hr tablet Take 1 tablet (100 mg total) by mouth daily. 04/10/17   Deboraha Sprang, MD  ondansetron (ZOFRAN) 4 MG tablet Take 1 tablet (4 mg total) by mouth every 8 (eight) hours as needed for nausea or vomiting. 07/21/17   Nance Pear, MD  Pancrelipase, Lip-Prot-Amyl, 6000 units CPEP Take 1 capsule (6,000 Units total) by mouth 3 (three) times daily with meals. 07/24/17   Mikey College, NP  QUEtiapine (SEROQUEL) 100 MG tablet Take 1  tablet (100 mg total) by mouth at bedtime. 07/24/17   Mikey College, NP  traMADol (ULTRAM) 50 MG tablet Take 1 tablet (50 mg total) by mouth every 6 (six) hours as needed for up to 20 doses for moderate pain or severe pain. 07/31/17   Lin Landsman, MD  verapamil (CALAN-SR) 240 MG CR tablet Take 1 tablet (240 mg total) by mouth at bedtime. 08/09/17   Deboraha Sprang, MD    Allergies Sulfa antibiotics; Acetaminophen; and Trazodone and nefazodone  Family History  Problem Relation Age of Onset  . Dementia  Mother   . Heart attack Father        Died age 48  . Heart disease Sister        Congential  . Heart disease Sister        "Some kind of heart surgery."  . Fibromyalgia Daughter     Social History Social History   Tobacco Use  . Smoking status: Current Every Day Smoker    Packs/day: 1.00    Years: 50.00    Pack years: 50.00    Types: Cigarettes  . Smokeless tobacco: Never Used  Substance Use Topics  . Alcohol use: No  . Drug use: No    Review of Systems Constitutional: No fever/chills Eyes: No visual changes. ENT: No sore throat. Cardiovascular: Denies chest pain. Respiratory: Denies shortness of breath. Gastrointestinal: Positive for chronic abdominal pain and pancreatitis.  Genitourinary: Negative for dysuria. Musculoskeletal: Negative for back pain. Skin: Negative for rash. Neurological: Positive for slurred speech and difficulty with walking. ____________________________________________   PHYSICAL EXAM:  VITAL SIGNS: ED Triage Vitals [08/15/17 1819]  Enc Vitals Group     BP (!) 163/109     Pulse Rate 60     Resp 16     Temp 98.1 F (36.7 C)     Temp src      SpO2 96 %     Weight 130 lb (59 kg)     Height 5\' 2"  (1.575 m)   Constitutional: Alert and oriented. Well appearing and in no distress. Eyes: Conjunctivae are normal.  ENT   Head: Normocephalic and atraumatic.   Nose: No congestion/rhinnorhea.   Mouth/Throat: Mucous membranes are moist.   Neck: No stridor. Hematological/Lymphatic/Immunilogical: No cervical lymphadenopathy. Cardiovascular: Normal rate, regular rhythm.  No murmurs, rubs, or gallops.  Respiratory: Normal respiratory effort without tachypnea nor retractions. Breath sounds are clear and equal bilaterally. No wheezes/rales/rhonchi. Gastrointestinal: Soft and non tender. No rebound. No guarding.  Genitourinary: Deferred Musculoskeletal: Normal range of motion in all extremities. No lower extremity edema. Neurologic:   Normal speech and language. No gross focal neurologic deficits are appreciated.  Skin:  Skin is warm, dry and intact. No rash noted. Psychiatric: Mood and affect are normal. Speech and behavior are normal. Patient exhibits appropriate insight and judgment.  ____________________________________________    LABS (pertinent positives/negatives)  Trop 0.09 CMP wnl except glu 118 CBC wnl INR 0.89  ____________________________________________   EKG  I, Nance Pear, attending physician, personally viewed and interpreted this EKG  EKG Time: 1824 Rate: 60 Rhythm: atrial paced rhythm Axis: normal Intervals: qtc 438 QRS: narrow ST changes: no st elevation Impression: abnormal ekg  ____________________________________________    RADIOLOGY  CT head No evidence of acute disease. Question aneurysm at basilar artery tip.   ____________________________________________   PROCEDURES  Procedures  ____________________________________________   INITIAL IMPRESSION / ASSESSMENT AND PLAN / ED COURSE  Pertinent labs & imaging results that were  available during my care of the patient were reviewed by me and considered in my medical decision making (see chart for details).  Patient presented to the emergency department today after concern for episode of slurred speech.  Symptoms lasted roughly 6 minutes.  By the time my exam patient was asymptomatic without any neuro deficits.  Patient's head CT without findings concerning for acute stroke.  Troponin was elevated 0.09.  Given patient's risk factors and elevated troponin will plan on admission to the hospital service.  Discussed findings and plan with patient.   ____________________________________________   FINAL CLINICAL IMPRESSION(S) / ED DIAGNOSES  Final diagnoses:  Elevated troponin  Slurred speech     Note: This dictation was prepared with Dragon dictation. Any transcriptional errors that result from this process are  unintentional     Nance Pear, MD 08/15/17 2128

## 2017-08-15 NOTE — Telephone Encounter (Signed)
Lori Orr-  I called and spoke with the Orr. She states she took her first dose of verapamil last night and has also been taking robitussin for a cold, but this morning she was speaking with her boyfriend over the phone and couldn't speak all of a sudden. She reports her boyfriend was telling her to call 911 in case she was having a stroke, but she states she hung up the phone and just sat down. After about 5-6 minutes her speech returned. She states she has not experienced this before. I advised her that I could not find a drug-drug interaction for verapamil and robitussin and did not see where verapamil may effect her speech. I advised her that the only way to know if the verapamil is the source of what happened to her this morning, would be to try the medication again. She is obviously very concerned about doing this. I advised her that her symptoms may not have been medication related at all and more neurologic in nature.  She is aware I will send to Dr. [name] and his nurse in [place] to review and address- she is aware we will try to call her back today as she is due to take her next dose of verapamil around bedtime tonight.  She voices understanding and is agreeable.

## 2017-08-15 NOTE — ED Triage Notes (Addendum)
Pt to ER c/o slurred speech that was followed by lack of being able to speak that occurred at 0730AM today. Symptoms lasted for approx 6 minutes, pt on phone with boyfriend who confirms this. Boyfriend reports that is "sounded like someone holding her tongue". States that symtoms went away. Called doctor and referred to ER to be evaluated. Pt has left eye twitching that has been ongoing for a few months. Pt has chronic pancreatitis and nausea-which she experienced today. Speech clear at this time. Pt alert and oriented X4, active, cooperative, pt in NAD. RR even and unlabored, color WNL.  No stroke like sx currently.

## 2017-08-15 NOTE — Telephone Encounter (Signed)
Pt c/o medication issue:  1. Name of Medication: Verapamil   2. How are you currently taking this medication (dosage and times per day)?  240 mg CR QHS  3. Are you having a reaction (difficulty breathing--STAT)?   Unable to speak  In the morning temporarily  4. What is your medication issue? Patient concerned she had an interaction with this and cold medication robitussin please call to discuss

## 2017-08-15 NOTE — ED Notes (Signed)
Pt cleared by MD to have fluids, pt provided water by RN

## 2017-08-15 NOTE — ED Notes (Signed)
Date and time results received: 08/15/17  Test: troponin  Critical Value: 0.09  Name of Provider Notified: Archie Balboa  Orders Received? Or Actions Taken?: MD notified

## 2017-08-16 ENCOUNTER — Inpatient Hospital Stay: Payer: Medicare Other

## 2017-08-16 ENCOUNTER — Other Ambulatory Visit: Payer: Self-pay

## 2017-08-16 DIAGNOSIS — I5032 Chronic diastolic (congestive) heart failure: Secondary | ICD-10-CM | POA: Diagnosis not present

## 2017-08-16 DIAGNOSIS — I671 Cerebral aneurysm, nonruptured: Secondary | ICD-10-CM | POA: Diagnosis not present

## 2017-08-16 DIAGNOSIS — G459 Transient cerebral ischemic attack, unspecified: Secondary | ICD-10-CM | POA: Diagnosis not present

## 2017-08-16 DIAGNOSIS — K861 Other chronic pancreatitis: Secondary | ICD-10-CM | POA: Diagnosis not present

## 2017-08-16 DIAGNOSIS — R4701 Aphasia: Secondary | ICD-10-CM | POA: Diagnosis not present

## 2017-08-16 DIAGNOSIS — Z8543 Personal history of malignant neoplasm of ovary: Secondary | ICD-10-CM | POA: Diagnosis not present

## 2017-08-16 DIAGNOSIS — I214 Non-ST elevation (NSTEMI) myocardial infarction: Secondary | ICD-10-CM | POA: Diagnosis not present

## 2017-08-16 DIAGNOSIS — R4781 Slurred speech: Secondary | ICD-10-CM | POA: Diagnosis not present

## 2017-08-16 DIAGNOSIS — I6523 Occlusion and stenosis of bilateral carotid arteries: Secondary | ICD-10-CM | POA: Diagnosis not present

## 2017-08-16 LAB — BASIC METABOLIC PANEL
Anion gap: 7 (ref 5–15)
BUN: 22 mg/dL — AB (ref 6–20)
CO2: 24 mmol/L (ref 22–32)
CREATININE: 0.85 mg/dL (ref 0.44–1.00)
Calcium: 8.8 mg/dL — ABNORMAL LOW (ref 8.9–10.3)
Chloride: 109 mmol/L (ref 101–111)
GFR calc Af Amer: >60 mL/min (ref >60)
GLUCOSE: 116 mg/dL — AB (ref 65–99)
Potassium: 3.8 mmol/L (ref 3.5–5.1)
Sodium: 140 mmol/L (ref 135–145)

## 2017-08-16 LAB — CBC
HEMATOCRIT: 40.9 % (ref 35.0–47.0)
Hemoglobin: 13.4 g/dL (ref 12.0–16.0)
MCH: 28.9 pg (ref 26.0–34.0)
MCHC: 32.9 g/dL (ref 32.0–36.0)
MCV: 87.8 fL (ref 80.0–100.0)
Platelets: 171 10*3/uL (ref 150–440)
RBC: 4.66 MIL/uL (ref 3.80–5.20)
RDW: 14 % (ref 11.5–14.5)
WBC: 5.4 10*3/uL (ref 3.6–11.0)

## 2017-08-16 LAB — LIPID PANEL
CHOL/HDL RATIO: 4.6 ratio
Cholesterol: 222 mg/dL — ABNORMAL HIGH (ref 0–200)
HDL: 48 mg/dL (ref >40)
LDL Cholesterol: 126 mg/dL — ABNORMAL HIGH (ref 0–99)
Triglycerides: 240 mg/dL — ABNORMAL HIGH (ref <150)
VLDL: 48 mg/dL — ABNORMAL HIGH (ref 0–40)

## 2017-08-16 LAB — TROPONIN I: TROPONIN I: 0.08 ng/mL — AB (ref <0.03)

## 2017-08-16 LAB — GLUCOSE, CAPILLARY: GLUCOSE-CAPILLARY: 95 mg/dL (ref 65–99)

## 2017-08-16 MED ORDER — ACETAMINOPHEN 325 MG PO TABS: 650.0000 mg | ORAL_TABLET | Freq: Four times a day (QID) | ORAL | Status: DC | PRN

## 2017-08-16 MED ORDER — HEPARIN SODIUM (PORCINE) 5000 UNIT/ML IJ SOLN
5000.0000 [IU] | Freq: Three times a day (TID) | INTRAMUSCULAR | Status: DC
  Administered 2017-08-16: 05:00:00 5000 [IU] via SUBCUTANEOUS
  Filled 2017-08-16: qty 1

## 2017-08-16 MED ORDER — HYDROCODONE-ACETAMINOPHEN 5-325 MG PO TABS
1.0000 | ORAL_TABLET | ORAL | Status: DC | PRN
  Administered 2017-08-16 (×3): 2 via ORAL
  Filled 2017-08-16 (×3): qty 2

## 2017-08-16 MED ORDER — ATORVASTATIN CALCIUM 20 MG PO TABS: 20.0000 mg | ORAL_TABLET | Freq: Every day | ORAL | 11 refills | Status: DC

## 2017-08-16 MED ORDER — BISACODYL 5 MG PO TBEC: 5.0000 mg | DELAYED_RELEASE_TABLET | Freq: Every day | ORAL | Status: DC | PRN

## 2017-08-16 MED ORDER — METOPROLOL SUCCINATE ER 50 MG PO TB24
100.0000 mg | ORAL_TABLET | Freq: Every day | ORAL | Status: DC
  Filled 2017-08-16: qty 2

## 2017-08-16 MED ORDER — BUPROPION HCL ER (XL) 300 MG PO TB24
300.0000 mg | ORAL_TABLET | Freq: Every day | ORAL | Status: DC
  Administered 2017-08-16: 10:00:00 300 mg via ORAL
  Filled 2017-08-16: qty 1

## 2017-08-16 MED ORDER — IOPAMIDOL (ISOVUE-370) INJECTION 76%
75.0000 mL | Freq: Once | INTRAVENOUS | Status: AC | PRN
  Administered 2017-08-16: 75 mL via INTRAVENOUS

## 2017-08-16 MED ORDER — ONDANSETRON HCL 4 MG/2ML IJ SOLN
4.0000 mg | Freq: Four times a day (QID) | INTRAMUSCULAR | Status: DC | PRN
  Administered 2017-08-16 (×2): 4 mg via INTRAVENOUS
  Filled 2017-08-16 (×2): qty 2

## 2017-08-16 MED ORDER — DOCUSATE SODIUM 100 MG PO CAPS
100.0000 mg | ORAL_CAPSULE | Freq: Two times a day (BID) | ORAL | Status: DC
  Administered 2017-08-16: 10:00:00 100 mg via ORAL
  Filled 2017-08-16: qty 1

## 2017-08-16 MED ORDER — ONDANSETRON HCL 4 MG PO TABS: 4.0000 mg | ORAL_TABLET | Freq: Four times a day (QID) | ORAL | Status: DC | PRN

## 2017-08-16 MED ORDER — ACETAMINOPHEN 650 MG RE SUPP
650.0000 mg | Freq: Four times a day (QID) | RECTAL | Status: DC | PRN
Start: 2017-08-16 — End: 2017-08-16

## 2017-08-16 MED ORDER — LISINOPRIL 20 MG PO TABS
40.0000 mg | ORAL_TABLET | Freq: Every day | ORAL | Status: DC
  Filled 2017-08-16: qty 2

## 2017-08-16 MED ORDER — ONDANSETRON HCL 4 MG PO TABS: 4.0000 mg | ORAL_TABLET | Freq: Three times a day (TID) | ORAL | Status: DC | PRN

## 2017-08-16 MED ORDER — ASPIRIN EC 81 MG PO TBEC: 81.0000 mg | DELAYED_RELEASE_TABLET | Freq: Every day | ORAL | 2 refills | Status: DC

## 2017-08-16 MED ORDER — TRAZODONE HCL 50 MG PO TABS: 25.0000 mg | ORAL_TABLET | Freq: Every evening | ORAL | Status: DC | PRN

## 2017-08-16 NOTE — Consult Note (Signed)
Referring Physician: Anselm Jungling    Chief Complaint: Slurred speech  HPI: Lori Orr is an 67 y.o. female who reports that she awakened at baseline on yesterday.  Was talking to her boyfriend on the phone and had acute onset of slurred speech.  Felt she was having some drooling as well.  Symptoms resolved on their own but she called her PCP who recommended ED evaluation.   Initial NIHSS of 0.    Date last known well: Date: 08/15/2017 Time last known well: Time: 08:00 tPA Given: No: Resolution of symptoms  Past Medical History:  Diagnosis Date  . Bradycardia   . Cancer ([place])    ovarian cancer  oophrectomy w/ only "1/4 ovary remaining"  . CHF (congestive heart failure) ([place])   . Chronic kidney disease   . Depression   . Hypertension   . Pancreatitis   . SSS (sick sinus syndrome) ([place])    PPM implant 03/08/11 [facility] [employer] [id number] - SN [id number]    Past Surgical History:  Procedure Laterality Date  . ABDOMINAL HYSTERECTOMY    . APPENDECTOMY    . PACEMAKER IMPLANT  03/08/2011   [employer] [id number] [phone]) - Implanted For SSS    Family History  Problem Relation Age of Onset  . Dementia Mother   . Heart attack Father        Died age 2  . Heart disease Sister        Congential  . Heart disease Sister        "Some kind of heart surgery."  . Fibromyalgia Daughter    Social History:  reports that she has been smoking cigarettes.  She has a 50.00 pack-year smoking history. she has never used smokeless tobacco. She reports that she does not drink alcohol or use drugs.  Allergies:  Allergies  Allergen Reactions  . Sulfa Antibiotics Rash  . Acetaminophen   . Trazodone And Nefazodone Other (See Comments)     Severe nightmares    Medications:  I have reviewed the patient's current medications. Prior to Admission:  Medications Prior to Admission  Medication Sig Dispense Refill Last Dose  . buPROPion (WELLBUTRIN XL) 300 MG 24 hr tablet Take 1 tablet (300 mg total) by  mouth daily. 90 tablet 1 08/15/2017 at 0800  . lisinopril (PRINIVIL,ZESTRIL) 40 MG tablet Take 1 tablet (40 mg total) by mouth daily. 90 tablet 3 08/15/2017 at 0800  . metoprolol succinate (TOPROL-XL) 100 MG 24 hr tablet Take 1 tablet (100 mg total) by mouth daily. 90 tablet 3 08/15/2017 at 0800  . verapamil (CALAN-SR) 240 MG CR tablet Take 1 tablet (240 mg total) by mouth at bedtime. 90 tablet 3 08/14/2017 at 1900  . diclofenac sodium (VOLTAREN) 1 % GEL Apply 2 g 4 (four) times daily as needed topically (moderate wrist pain). (Patient not taking: Reported on 08/15/2017) 100 g 2 Not Taking at Unknown time  . dicyclomine (BENTYL) 20 MG tablet Take 1 tablet (20 mg total) by mouth 3 (three) times daily as needed (abdominal pain). (Patient not taking: Reported on 07/31/2017) 30 tablet 0 Not Taking at Unknown time  . ibuprofen (ADVIL,MOTRIN) 600 MG tablet Take 1 tablet (600 mg total) every 8 (eight) hours as needed by mouth. (Patient not taking: Reported on 08/15/2017) 15 tablet 0 Not Taking at Unknown time  . ondansetron (ZOFRAN) 4 MG tablet Take 1 tablet (4 mg total) by mouth every 8 (eight) hours as needed for nausea or vomiting. (Patient not taking: Reported on  08/15/2017) 20 tablet 0 Not Taking at Unknown time  . Pancrelipase, Lip-Prot-Amyl, 6000 units CPEP Take 1 capsule (6,000 Units total) by mouth 3 (three) times daily with meals. (Patient not taking: Reported on 08/15/2017) 120 capsule 0 Not Taking  . QUEtiapine (SEROQUEL) 100 MG tablet Take 1 tablet (100 mg total) by mouth at bedtime. (Patient not taking: Reported on 08/15/2017) 30 tablet 5 Not Taking at Unknown time  . traMADol (ULTRAM) 50 MG tablet Take 1 tablet (50 mg total) by mouth every 6 (six) hours as needed for up to 20 doses for moderate pain or severe pain. (Patient not taking: Reported on 08/15/2017) 20 tablet 0 Not Taking at Unknown time   Scheduled: . buPROPion  300 mg Oral Daily  . docusate sodium  100 mg Oral BID  . heparin  5,000 Units Subcutaneous  Q8H  . lisinopril  40 mg Oral Daily  . metoprolol succinate  100 mg Oral Daily    ROS: History obtained from the patient  General ROS: negative for - chills, fatigue, fever, night sweats, weight gain or weight loss Psychological ROS: negative for - behavioral disorder, hallucinations, memory difficulties, mood swings or suicidal ideation Ophthalmic ROS: negative for - blurry vision, double vision, eye pain or loss of vision ENT ROS: negative for - epistaxis, nasal discharge, oral lesions, sore throat, tinnitus or vertigo Allergy and Immunology ROS: negative for - hives or itchy/watery eyes Hematological and Lymphatic ROS: negative for - bleeding problems, bruising or swollen lymph nodes Endocrine ROS: negative for - galactorrhea, hair pattern changes, polydipsia/polyuria or temperature intolerance Respiratory ROS: negative for - cough, hemoptysis, shortness of breath or wheezing Cardiovascular ROS: negative for - chest pain, dyspnea on exertion, edema or irregular heartbeat Gastrointestinal ROS: negative for - abdominal pain, diarrhea, hematemesis, nausea/vomiting or stool incontinence Genito-Urinary ROS: negative for - dysuria, hematuria, incontinence or urinary frequency/urgency Musculoskeletal ROS: negative for - joint swelling or muscular weakness Neurological ROS: as noted in HPI Dermatological ROS: negative for rash and skin lesion changes  Physical Examination: Blood pressure 137/74, pulse 60, temperature 97.7 F (36.5 C), temperature source Oral, resp. rate 16, height 5\' 2"  (1.575 m), weight 54.4 kg (119 lb 14 oz), SpO2 94 %.  HEENT-  Normocephalic, no lesions, without obvious abnormality.  Normal external eye and conjunctiva.  Normal TM's bilaterally.  Normal auditory canals and external ears. Normal external nose, mucus membranes and septum.  Normal pharynx. Cardiovascular- S1, S2 normal, pulses palpable throughout   Lungs- chest clear, no wheezing, rales, normal symmetric air  entry Abdomen- soft, non-tender; bowel sounds normal; no masses,  no organomegaly Extremities- no edema Lymph-no adenopathy palpable Musculoskeletal-no joint tenderness, deformity or swelling Skin-warm and dry, no hyperpigmentation, vitiligo, or suspicious lesions  Neurological Examination   Mental Status: Alert, oriented, thought content appropriate.  Speech fluent without evidence of aphasia.  Able to follow 3 step commands without difficulty. Cranial Nerves: II: Discs flat bilaterally; Visual fields grossly normal, pupils equal, round, reactive to light and accommodation III,IV, VI: left ptosis, extra-ocular motions intact bilaterally V,VII: smile symmetric, facial light touch sensation normal bilaterally VIII: hearing normal bilaterally IX,X: gag reflex present XI: bilateral shoulder shrug XII: midline tongue extension Motor: Right : Upper extremity   5/5    Left:     Upper extremity   5/5  Lower extremity   5/5     Lower extremity   5/5 Tone and bulk:normal tone throughout; no atrophy noted Sensory: Pinprick and light touch intact throughout, bilaterally Deep  Tendon Reflexes: 2+ and symmetric throughout Plantars: Right: downgoing   Left: downgoing Cerebellar: Normal finger-to-nose, normal rapid alternating movements and normal heel-to-shin testing bilaterally Gait: normal gait and station    Laboratory Studies:  Basic Metabolic Panel: Recent Labs  Lab 08/15/17 1835 08/16/17 0437  NA 139 140  K 4.1 3.8  CL 108 109  CO2 22 24  GLUCOSE 118* 116*  BUN 19 22*  CREATININE 0.87 0.85  CALCIUM 9.1 8.8*    Liver Function Tests: Recent Labs  Lab 08/15/17 1835  AST 24  ALT 19  ALKPHOS 67  BILITOT 0.7  PROT 7.1  ALBUMIN 4.0   No results for input(s): LIPASE, AMYLASE in the last 168 hours. No results for input(s): AMMONIA in the last 168 hours.  CBC: Recent Labs  Lab 08/15/17 1835 08/16/17 0437  WBC 6.6 5.4  NEUTROABS 3.9  --   HGB 14.6 13.4  HCT 44.1 40.9   MCV 86.9 87.8  PLT 171 171    Cardiac Enzymes: Recent Labs  Lab 08/15/17 1835 08/16/17 0437  TROPONINI 0.09* 0.08*    BNP: Invalid input(s): POCBNP  CBG: Recent Labs  Lab 08/16/17 0726  GLUCAP 95    Microbiology: Results for orders placed or performed during the hospital encounter of 02/25/17  Urine Culture     Status: Abnormal   Collection Time: 02/25/17 11:40 AM  Result Value Ref Range Status   Specimen Description URINE, CLEAN CATCH  Final   Special Requests Normal  Final   Culture MULTIPLE SPECIES PRESENT, SUGGEST RECOLLECTION (A)  Final   Report Status 02/27/2017 FINAL  Final    Coagulation Studies: Recent Labs    08/15/17 1835  LABPROT 12.0  INR 0.89    Urinalysis: No results for input(s): COLORURINE, LABSPEC, PHURINE, GLUCOSEU, HGBUR, BILIRUBINUR, KETONESUR, PROTEINUR, UROBILINOGEN, NITRITE, LEUKOCYTESUR in the last 168 hours.  Invalid input(s): APPERANCEUR  Lipid Panel:    Component Value Date/Time   CHOL 222 (H) 08/16/2017 0437   TRIG 240 (H) 08/16/2017 0437   HDL 48 08/16/2017 0437   CHOLHDL 4.6 08/16/2017 0437   VLDL 48 (H) 08/16/2017 0437   LDLCALC 126 (H) 08/16/2017 0437    HgbA1C: No results found for: HGBA1C  Urine Drug Screen:  No results found for: LABOPIA, COCAINSCRNUR, LABBENZ, AMPHETMU, THCU, LABBARB  Alcohol Level: No results for input(s): ETH in the last 168 hours.  Other results: EKG: atrial paced, 60 bpm  Imaging: Ct Angio Head W Or Wo Contrast  Result Date: 08/16/2017 CLINICAL DATA:  Aneurysm of the basilar tip questioned on previous CT. Episode of slurred speech yesterday. EXAM: CT ANGIOGRAPHY HEAD TECHNIQUE: Multidetector CT imaging of the head was performed using the standard protocol during bolus administration of intravenous contrast. Multiplanar CT image reconstructions and MIPs were obtained to evaluate the vascular anatomy. CONTRAST:  52mL ISOVUE-370 IOPAMIDOL (ISOVUE-370) INJECTION 76% COMPARISON:  CT 08/15/2017  FINDINGS: CT HEAD Brain: No change since yesterday. Mild generalized atrophy. Mild chronic appearing small vessel changes of the basal ganglia. No sign of acute infarction, mass lesion, hemorrhage, hydrocephalus or extra-axial collection. Vascular: Atherosclerotic changes.  See below. Skull: Negative Sinuses: Clear Orbits: Negative CTA HEAD Anterior circulation: Both internal carotid arteries are patent through the skull base and siphon regions. There is atherosclerotic disease of the carotid siphon regions with tortuosity and peripheral calcification. No visible flow limiting stenosis. Anterior and middle cerebral vessels are patent without proximal stenosis, aneurysm or vascular malformation. Posterior circulation: Both vertebral arteries are patent through the  foramen magnum to the basilar. Both posterior inferior cerebellar arteries show flow. The basilar artery is tortuous with dolichoectasia of the basilar tip but no true berry aneurysm. Posterior circulation branch vessels appear normal. Venous sinuses: Patent and normal. Anatomic variants: None significant Delayed phase: No abnormal enhancement. IMPRESSION: Atherosclerotic dolichoectasia and tortuosity of the vessels as might be seen with chronic hypertension/vascular disease. Basilar tip shows dolichoectasia but there is no true aneurysm. No intracranial stenosis or large/medium vessel occlusion identified. Electronically Signed   By: Nelson Chimes M.D.   On: 08/16/2017 09:18   Ct Head Wo Contrast  Result Date: 08/15/2017 CLINICAL DATA:  Slurred speech which and eye twitching EXAM: CT HEAD WITHOUT CONTRAST TECHNIQUE: Contiguous axial images were obtained from the base of the skull through the vertex without intravenous contrast. COMPARISON:  None. FINDINGS: Brain: No acute territorial infarction, hemorrhage, or intracranial mass is visualized. The ventricles are nonenlarged. Vascular: No hyperdense vessels. Carotid vascular calcification. Possible mild  aneurysmal dilatation of the tip of the basilar artery. Skull: No fracture or suspicious lesion Sinuses/Orbits: Minimal mucosal thickening in the sphenoid and ethmoid sinuses. No acute orbital abnormality Other: None IMPRESSION: 1. No CT evidence for acute intracranial abnormality. 2. 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 Further evaluation with CTA is recommended. Electronically Signed   By: Donavan Foil M.D.   On: 08/15/2017 18:44   US Carotid Bilateral  Result Date: 08/16/2017 CLINICAL DATA:  67 year old female with a history of TIA. Cardiovascular risk factors include hypertension, stroke/TIA, tobacco use EXAM: BILATERAL CAROTID DUPLEX ULTRASOUND TECHNIQUE: Pearline Cables scale imaging, color Doppler and duplex ultrasound were performed of bilateral carotid and vertebral arteries in the neck. COMPARISON:  No prior duplex FINDINGS: Criteria: Quantification of carotid stenosis is based on velocity parameters that correlate the residual internal carotid diameter with NASCET-based stenosis levels, using the diameter of the distal internal carotid lumen as the denominator for stenosis measurement. The following velocity measurements were obtained: RIGHT ICA:  Systolic 90 cm/sec, Diastolic 35 cm/sec CCA:  69 cm/sec SYSTOLIC ICA/CCA RATIO:  1.5 ECA:  80 cm/sec LEFT ICA:  Systolic 84 cm/sec, Diastolic per 33 cm/sec CCA:  81 cm/sec SYSTOLIC ICA/CCA RATIO:  1.2 ECA:  88 cm/sec Right Brachial SBP: Not acquired Left Brachial SBP: Not acquired RIGHT CAROTID ARTERY: No significant calcifications of the right common carotid artery. Intermediate waveform maintained. Heterogeneous and partially calcified plaque at the right carotid bifurcation. No significant lumen shadowing. Low resistance waveform of the right ICA. No significant tortuosity. RIGHT VERTEBRAL ARTERY: Antegrade flow with low resistance waveform. LEFT CAROTID ARTERY: No significant calcifications of the left common carotid artery. Intermediate waveform maintained.  Heterogeneous and partially calcified plaque at the left carotid bifurcation without significant lumen shadowing. Low resistance waveform of the left ICA. No significant tortuosity. LEFT VERTEBRAL ARTERY:  Antegrade flow with low resistance waveform. IMPRESSION: Color duplex indicates minimal heterogeneous and calcified plaque, with no hemodynamically significant stenosis by duplex criteria in the extracranial cerebrovascular circulation. Signed, Dulcy Fanny. Earleen Newport, DO [facility] Electronically Signed   By: Corrie Mckusick D.O.   On: 08/16/2017 10:11    Assessment: 67 y.o. female presenting after an episode of slurred speech.  Concern is for TIA.  Head CT reviewed and shows no acute changes.  There was concern for a possible basilar tip aneurysm.  Follow up CTA showed no evidence of aneurysm and no hemodynamically significant stenosis.  Carotid dopplers show no evidence of hemodynamically significant stenosis.  Echocardiogram performed  on 08/07/17 shows no cardiac source of emboli with an EF of 65-70%.  LDL 126.  Stroke Risk Factors - hyperlipidemia, hypertension and smoking  Plan: 1. PT consult, OT consult, Speech consult not required since patient back to baseline. 2. Prophylactic therapy-Antiplatelet med: Aspirin - daily 3. Telemetry monitoring 4. Frequent neuro checks 5. Smoking cessation counseling 6. Statin for lipid management with target LDL<70.   Alexis Goodell, MD Neurology [phone] 08/16/2017, 1:54 PM

## 2017-08-16 NOTE — H&P (Signed)
[facility] NAME: Lori Orr    MR#:  [id number]  DATE OF BIRTH:  1951/06/02  DATE OF ADMISSION:  08/15/2017  PRIMARY CARE PHYSICIAN: Mikey College, NP   REQUESTING/REFERRING PHYSICIAN:   CHIEF COMPLAINT:   Chief Complaint  Patient presents with  . Aphasia    HISTORY OF PRESENT ILLNESS: Lori Orr  is a 67 y.o. female with a known history of chronic pancreatitis, HTN and CHF.  Patient presented to emergency room for a short episode of 6 minutes slurred speech and generalized weakness, they happen earlier in the day.  Patient called the primary care doctor and was told to go to emergency room to rule out possible stroke.  Patient is currently asymptomatic.  She denies fever/chills, chest pain or shortness of breath.  Patient complains of chronic, recurrent episodes of nausea and epigastric pain, secondary to chronic pancreatitis.   Blood test done in the emergency room are remarkable for elevated troponin level is 0.09 glucose level is 118. Most recent echocardiogram was done on February 25th, this year and it showed diastolic dysfunction.  Ejection fraction is at 70%. CT of the brain, without contrast, reviewed by myself, as noted without any acute intracranial abnormality.  There is a possible aneurysm of the basilar tip. Further evaluation with CTA is recommended. Patient is admitted for further evaluation and treatment.   PAST MEDICAL HISTORY:   Past Medical History:  Diagnosis Date  . Bradycardia   . Cancer ([place])    ovarian cancer  oophrectomy w/ only "1/4 ovary remaining"  . CHF (congestive heart failure) ([place])   . Chronic kidney disease   . Depression   . Hypertension   . Pancreatitis   . SSS (sick sinus syndrome) ([place])    PPM implant 03/08/11 - [facility] - SN [id number]    PAST SURGICAL HISTORY:  Past Surgical History:  Procedure Laterality Date  . ABDOMINAL HYSTERECTOMY    .  APPENDECTOMY    . PACEMAKER IMPLANT  03/08/2011   [employer] [id number] [phone]) - Implanted For SSS    SOCIAL HISTORY:  Social History   Tobacco Use  . Smoking status: Current Every Day Smoker    Packs/day: 1.00    Years: 50.00    Pack years: 50.00    Types: Cigarettes  . Smokeless tobacco: Never Used  Substance Use Topics  . Alcohol use: No    FAMILY HISTORY:  Family History  Problem Relation Age of Onset  . Dementia Mother   . Heart attack Father        Died age 47  . Heart disease Sister        Congential  . Heart disease Sister        "Some kind of heart surgery."  . Fibromyalgia Daughter     DRUG ALLERGIES:  Allergies  Allergen Reactions  . Sulfa Antibiotics Rash  . Acetaminophen   . Trazodone And Nefazodone Other (See Comments)     Severe nightmares    REVIEW OF SYSTEMS:   CONSTITUTIONAL: No fever, fatigue, but positive for generalized weakness.  EYES: No blurred or double vision.  EARS, NOSE, AND THROAT: No tinnitus or ear pain.  RESPIRATORY: No cough, shortness of breath, wheezing or hemoptysis.  CARDIOVASCULAR: No chest pain, orthopnea, edema.  GASTROINTESTINAL: Chronic, recurrent nausea and epigastric pain, secondary to chronic pancreatitis.  GENITOURINARY: No dysuria, hematuria.  ENDOCRINE: No polyuria, nocturia,  HEMATOLOGY: No bleeding SKIN:  No rash or lesion. MUSCULOSKELETAL: No joint pain.   NEUROLOGIC: No focal weakness.  PSYCHIATRY: Positive history of anxiety disorder.   MEDICATIONS AT HOME:  Prior to Admission medications   Medication Sig Start Date End Date Taking? Authorizing Provider  buPROPion (WELLBUTRIN XL) 300 MG 24 hr tablet Take 1 tablet (300 mg total) by mouth daily. 05/15/17  Yes Mikey College, NP  lisinopril (PRINIVIL,ZESTRIL) 40 MG tablet Take 1 tablet (40 mg total) by mouth daily. 04/10/17  Yes Deboraha Sprang, MD  metoprolol succinate (TOPROL-XL) 100 MG 24 hr tablet Take 1 tablet (100 mg total) by mouth daily.  04/10/17  Yes Deboraha Sprang, MD  verapamil (CALAN-SR) 240 MG CR tablet Take 1 tablet (240 mg total) by mouth at bedtime. 08/09/17  Yes Deboraha Sprang, MD  diclofenac sodium (VOLTAREN) 1 % GEL Apply 2 g 4 (four) times daily as needed topically (moderate wrist pain). Patient not taking: Reported on 08/15/2017 04/18/17   Mikey College, NP  dicyclomine (BENTYL) 20 MG tablet Take 1 tablet (20 mg total) by mouth 3 (three) times daily as needed (abdominal pain). Patient not taking: Reported on 07/31/2017 07/21/17   Nance Pear, MD  ibuprofen (ADVIL,MOTRIN) 600 MG tablet Take 1 tablet (600 mg total) every 8 (eight) hours as needed by mouth. Patient not taking: Reported on 08/15/2017 05/01/17   Sable Feil, PA-C  ondansetron (ZOFRAN) 4 MG tablet Take 1 tablet (4 mg total) by mouth every 8 (eight) hours as needed for nausea or vomiting. Patient not taking: Reported on 08/15/2017 07/21/17   Nance Pear, MD  Pancrelipase, Lip-Prot-Amyl, 6000 units CPEP Take 1 capsule (6,000 Units total) by mouth 3 (three) times daily with meals. Patient not taking: Reported on 08/15/2017 07/24/17   Mikey College, NP  QUEtiapine (SEROQUEL) 100 MG tablet Take 1 tablet (100 mg total) by mouth at bedtime. Patient not taking: Reported on 08/15/2017 07/24/17   Mikey College, NP  traMADol (ULTRAM) 50 MG tablet Take 1 tablet (50 mg total) by mouth every 6 (six) hours as needed for up to 20 doses for moderate pain or severe pain. Patient not taking: Reported on 08/15/2017 07/31/17   Lin Landsman, MD      PHYSICAL EXAMINATION:   VITAL SIGNS: Blood pressure (!) 155/65, pulse 60, temperature 97.8 F (36.6 C), temperature source Oral, resp. rate 20, height 5\' 2"  (1.575 m), weight 54.4 kg (119 lb 14.9 oz), SpO2 96 %.  GENERAL:  67 y.o.-year-old patient lying in the bed with no acute distress.  EYES: Pupils equal, round, reactive to light and accommodation. No scleral icterus. Extraocular muscles intact.   HEENT: Head atraumatic, normocephalic. Oropharynx and nasopharynx clear.  NECK:  Supple, no jugular venous distention. No thyroid enlargement, no tenderness.  LUNGS: Normal breath sounds bilaterally, no wheezing, rales,rhonchi or crepitation. No use of accessory muscles of respiration.  CARDIOVASCULAR: S1, S2 normal. No S3/S4.  ABDOMEN: Soft, nontender, nondistended. Bowel sounds present. No organomegaly or mass.  EXTREMITIES: No pedal edema, cyanosis, or clubbing.  NEUROLOGIC: Cranial nerves II through XII are intact. Muscle strength 5/5 in all extremities. Sensation intact. Gait is stable. PSYCHIATRIC: The patient is alert and oriented x 3.  SKIN: No obvious rash, lesion, or ulcer.   LABORATORY PANEL:   CBC Recent Labs  Lab 08/15/17 1835  WBC 6.6  HGB 14.6  HCT 44.1  PLT 171  MCV 86.9  MCH 28.7  MCHC 33.0  RDW 13.7  LYMPHSABS 1.8  MONOABS 0.7  EOSABS 0.2  BASOSABS 0.1   ------------------------------------------------------------------------------------------------------------------  Chemistries  Recent Labs  Lab 08/15/17 1835  NA 139  K 4.1  CL 108  CO2 22  GLUCOSE 118*  BUN 19  CREATININE 0.87  CALCIUM 9.1  AST 24  ALT 19  ALKPHOS 67  BILITOT 0.7   ------------------------------------------------------------------------------------------------------------------ estimated creatinine clearance is 48.9 mL/min (by C-G formula based on SCr of 0.87 mg/dL). ------------------------------------------------------------------------------------------------------------------ No results for input(s): TSH, T4TOTAL, T3FREE, THYROIDAB in the last 72 hours.  Invalid input(s): FREET3   Coagulation profile Recent Labs  Lab 08/15/17 1835  INR 0.89   ------------------------------------------------------------------------------------------------------------------- No results for input(s): DDIMER in the last 72  hours. -------------------------------------------------------------------------------------------------------------------  Cardiac Enzymes Recent Labs  Lab 08/15/17 1835  TROPONINI 0.09*   ------------------------------------------------------------------------------------------------------------------ Invalid input(s): POCBNP  ---------------------------------------------------------------------------------------------------------------  Urinalysis    Component Value Date/Time   COLORURINE YELLOW (A) 04/03/2017 0150   APPEARANCEUR CLEAR (A) 04/03/2017 0150   LABSPEC 1.013 04/03/2017 0150   PHURINE 6.0 04/03/2017 0150   GLUCOSEU NEGATIVE 04/03/2017 0150   HGBUR NEGATIVE 04/03/2017 0150   BILIRUBINUR NEGATIVE 04/03/2017 0150   KETONESUR NEGATIVE 04/03/2017 0150   PROTEINUR NEGATIVE 04/03/2017 0150   NITRITE NEGATIVE 04/03/2017 0150   LEUKOCYTESUR SMALL (A) 04/03/2017 0150     RADIOLOGY: Ct Head Wo Contrast  Result Date: 08/15/2017 CLINICAL DATA:  Slurred speech which and eye twitching EXAM: CT HEAD WITHOUT CONTRAST TECHNIQUE: Contiguous axial images were obtained from the base of the skull through the vertex without intravenous contrast. COMPARISON:  None. FINDINGS: Brain: No acute territorial infarction, hemorrhage, or intracranial mass is visualized. The ventricles are nonenlarged. Vascular: No hyperdense vessels. Carotid vascular calcification. Possible mild aneurysmal dilatation of the tip of the basilar artery. Skull: No fracture or suspicious lesion Sinuses/Orbits: Minimal mucosal thickening in the sphenoid and ethmoid sinuses. No acute orbital abnormality Other: None IMPRESSION: 1. No CT evidence for acute intracranial abnormality. 2. 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 Further evaluation with CTA is recommended. Electronically Signed   By: Donavan Foil M.D.   On: 08/15/2017 18:44    EKG: Orders placed or performed during the hospital encounter of 08/15/17  . ED EKG   . ED EKG    IMPRESSION AND PLAN:  1.  TIA.  We will check carotid Doppler and CT angiogram of the brain.  Continue to monitor clinically closely. 2.  Cerebral aneurysm of the basilar tip.  Will further evaluate with CT angiogram of the brain. 3. NSTEMI.  First troponin level is slightly elevated at 0.09.  Patient denies any chest pain.  We will start aspirin and we will continue to monitor on telemetry.  Will follow troponin level.  Cardiology is consulted to further evaluate the patient. 4.  Diastolic CHF. Currently, clinically well compensated, will continue medical management. 5.  Hypertension, stable, continue home medications. 6.  Tobacco abuse.  Smoking cessation was discussed with patient in detail.  All the records are reviewed and case discussed with ED provider. Management plans discussed with the patient, family and they are in agreement.  CODE STATUS:    Code Status Orders  (From admission, onward)        Start     Ordered   08/16/17 0014  Full code  Continuous     08/16/17 0014    Code Status History    Date Active Date Inactive Code Status Order ID Comments User Context   02/25/2017 16:53 02/26/2017 13:13 Full Code [id number]  Idelle Crouch,  MD Inpatient       TOTAL TIME TAKING CARE OF THIS PATIENT: 40 minutes.    Amelia Jo M.D on 08/16/2017 at 4:02 AM  Between 7am to 6pm - Pager - [phone]  After 6pm go to [email or web address] - password [email or web address] [facility]  Office  [phone]  CC: Primary care physician; Mikey College, NP

## 2017-08-16 NOTE — Progress Notes (Signed)
Pt discharged at this time.  No distress. Instructions discussed with pt  Meds/ diet / activity and f/u discussed.  Verbalize understanding. Out via w/c. Sl d/cd.

## 2017-08-17 ENCOUNTER — Telehealth: Payer: Self-pay | Admitting: Internal Medicine

## 2017-08-17 NOTE — Telephone Encounter (Signed)
Patient recently had a cva and wants to know if high cholesterol caused this   Patient also wants [place] to know she never saw a heart doctor while admitted to [facility]    she is upset about both of these things patient seems distraught and frustrated on the phone

## 2017-08-17 NOTE — Telephone Encounter (Signed)
Reviewed the patient's chart with Lori Orr- office manager to follow up on cardiology consult. After review, we did find that the consult request was cancelled, but no reason why.

## 2017-08-17 NOTE — Telephone Encounter (Signed)
I spoke with the patient- she was seen in the ER 3/5 & admitted for possible CVA/ TIA. The patient had a Head CT/ carotid US which were unrevealing. She calls today to voice frustration about being discharged without seeing the cardiologist. She states it was on the board in her room that she was to see neurology & cardiology. She states the nurse held her BP medication yesterday because her HR was too low, but she does not know how low this got. She reports that she was told she could go and as she was getting ready to leave, the neurologist came in her room to evaluate her, the neurologist asked her where she was going and the patient explained she was told she could leave.  The patient had to ask the neurologist to review her cholesterol panel and she was told this was elevated- she was discharged on atorvastatin 20 mg.  She is going to pick this up today. She states she left around 3 pm yesterday and no cardiologist ever came to see her. I advised her cardiology may have not been sent a consult request- I will follow up on this.   The patient is scheduled to follow up on 09/19/17 with Tommye Standard, PA.  I will review the above with Dr. [name] so he is aware of what transpired and make sure the 4/9 follow up date does not need to be moved up sooner- the patient lives in [place], but sees Dr. [name] in [place] due to scheduling.

## 2017-08-18 ENCOUNTER — Telehealth: Payer: Self-pay

## 2017-08-18 LAB — GLUCOSE, CAPILLARY: Glucose-Capillary: 116 mg/dL — ABNORMAL HIGH (ref 65–99)

## 2017-08-18 NOTE — Telephone Encounter (Signed)
Transition Care Management Follow-up Telephone Call  Spoke with daughter Lori Orr   Date discharged?08/16/2017   How have you been since you were released from the hospital? "feeling better today "   Do you understand why you were in the hospital? yes   Do you understand the discharge instructions? yes   Where were you discharged to? home   Items Reviewed:  Medications reviewed: yes  Allergies reviewed: yes  Dietary changes reviewed: yes  Referrals reviewed: yes   Functional Questionnaire:   Activities of Daily Living (ADLs):   She states they are independent in the following: ambulation, bathing and hygiene, feeding, continence, grooming, toileting and dressing States they require assistance with the following: none   Any transportation issues/concerns?: no   Any patient concerns? no   Confirmed importance and date/time of follow-up visits scheduled yes  No appointment made at this time, patient is in [place] with daughter taking care of some family emergency, they will call when they get back to make hospital follow up.  Confirmed with patient if condition begins to worsen call PCP or go to the ER.  Patient was given the office number and encouraged to call back with question or concerns.  : yes

## 2017-08-21 ENCOUNTER — Telehealth: Payer: Self-pay | Admitting: Internal Medicine

## 2017-08-21 NOTE — Telephone Encounter (Signed)
Spoke with patient's daughter in regards to appt change to 4/1.Marland Kitchen She verbalized understanding and stated that she was already called about this.. Thank you

## 2017-08-21 NOTE — Telephone Encounter (Signed)
Patient daughter r/s fu from 4/9 to 4/1  This will be a week sooner than intended but the only days she can come to [facility] are on Monday   Please advise if appt should be r/s

## 2017-08-23 NOTE — Discharge Summary (Signed)
[facility] NAME: Lori Orr    MR#:  [id number]  DATE OF BIRTH:  02/28/1951  DATE OF ADMISSION:  08/15/2017 ADMITTING PHYSICIAN: Gearlean Alf, PA-C  DATE OF DISCHARGE: 08/16/2017  3:39 PM  PRIMARY CARE PHYSICIAN: Mikey College, NP    ADMISSION DIAGNOSIS:  Slurred speech [R47.81] Elevated troponin [R74.8]  DISCHARGE DIAGNOSIS:  Active Problems:   TIA (transient ischemic attack)   SECONDARY DIAGNOSIS:   Past Medical History:  Diagnosis Date  . Bradycardia   . Cancer ([place])    ovarian cancer  oophrectomy w/ only "1/4 ovary remaining"  . CHF (congestive heart failure) ([place])   . Chronic kidney disease   . Depression   . Hypertension   . Pancreatitis   . SSS (sick sinus syndrome) ([place])    PPM implant 03/08/11 - [employer] [id number] - SN [id number]    HOSPITAL COURSE:   1.  TIA.  We will check carotid Doppler and CT angiogram of the brain.  Continue to monitor clinically closely.  Neurologist saw the pt, After eval is done- d/c with Antiplatelets and statins. 2.  Cerebral aneurysm of the basilar tip.  Will further evaluate with CT angiogram of the brain. 3. NSTEMI.  First troponin level is slightly elevated at 0.09.  Patient denies any chest pain.  We will start aspirin and we will continue to monitor on telemetry.  Will follow troponin level.  Cardiology is consulted to further evaluate the patient. 4.  Diastolic CHF. Currently, clinically well compensated, will continue medical management. 5.  Hypertension, stable, continue home medications. 6.  Tobacco abuse.  Smoking cessation was discussed with patient in detail.     DISCHARGE CONDITIONS:  Stable.  CONSULTS OBTAINED:  Treatment Team:  Yolonda Kida, MD Alexis Goodell, MD  DRUG ALLERGIES:   Allergies  Allergen Reactions  . Sulfa Antibiotics Rash  . Acetaminophen   . Trazodone And Nefazodone Other (See Comments)     Severe  nightmares    DISCHARGE MEDICATIONS:   Allergies as of 08/16/2017      Reactions   Sulfa Antibiotics Rash   Acetaminophen    Trazodone And Nefazodone Other (See Comments)    Severe nightmares      Medication List    STOP taking these medications   ibuprofen 600 MG tablet Commonly known as:  ADVIL,MOTRIN   ondansetron 4 MG tablet Commonly known as:  ZOFRAN   QUEtiapine 100 MG tablet Commonly known as:  SEROQUEL   traMADol 50 MG tablet Commonly known as:  ULTRAM     TAKE these medications   aspirin EC 81 MG tablet Take 1 tablet (81 mg total) by mouth daily.   atorvastatin 20 MG tablet Commonly known as:  LIPITOR Take 1 tablet (20 mg total) by mouth daily.   buPROPion 300 MG 24 hr tablet Commonly known as:  WELLBUTRIN XL Take 1 tablet (300 mg total) by mouth daily.   diclofenac sodium 1 % Gel Commonly known as:  VOLTAREN Apply 2 g 4 (four) times daily as needed topically (moderate wrist pain).   dicyclomine 20 MG tablet Commonly known as:  BENTYL Take 1 tablet (20 mg total) by mouth 3 (three) times daily as needed (abdominal pain).   lisinopril 40 MG tablet Commonly known as:  PRINIVIL,ZESTRIL Take 1 tablet (40 mg total) by mouth daily.   metoprolol succinate 100 MG 24 hr tablet Commonly known as:  TOPROL-XL Take 1 tablet (  100 mg total) by mouth daily.   Pancrelipase (Lip-Prot-Amyl) 6000 units Cpep Take 1 capsule (6,000 Units total) by mouth 3 (three) times daily with meals.   verapamil 240 MG CR tablet Commonly known as:  CALAN-SR Take 1 tablet (240 mg total) by mouth at bedtime.        DISCHARGE INSTRUCTIONS:    Follow with PMD  If you experience worsening of your admission symptoms, develop shortness of breath, life threatening emergency, suicidal or homicidal thoughts you must seek medical attention immediately by calling 911 or calling your MD immediately  if symptoms less severe.  You Must read complete instructions/literature along with all  the possible adverse reactions/side effects for all the Medicines you take and that have been prescribed to you. Take any new Medicines after you have completely understood and accept all the possible adverse reactions/side effects.   Please note  You were cared for by a hospitalist during your hospital stay. If you have any questions about your discharge medications or the care you received while you were in the hospital after you are discharged, you can call the unit and asked to speak with the hospitalist on call if the hospitalist that took care of you is not available. Once you are discharged, your primary care physician will handle any further medical issues. Please note that NO REFILLS for any discharge medications will be authorized once you are discharged, as it is imperative that you return to your primary care physician (or establish a relationship with a primary care physician if you do not have one) for your aftercare needs so that they can reassess your need for medications and monitor your lab values.    Today   CHIEF COMPLAINT:   Chief Complaint  Patient presents with  . Aphasia    HISTORY OF PRESENT ILLNESS:  Lori Orr  is a 67 y.o. female with a known history of chronic pancreatitis, HTN and CHF.  Patient presented to emergency room for a short episode of 6 minutes slurred speech and generalized weakness, they happen earlier in the day.  Patient called the primary care doctor and was told to go to emergency room to rule out possible stroke.  Patient is currently asymptomatic.  She denies fever/chills, chest pain or shortness of breath.  Patient complains of chronic, recurrent episodes of nausea and epigastric pain, secondary to chronic pancreatitis.   Blood test done in the emergency room are remarkable for elevated troponin level is 0.09 glucose level is 118. Most recent echocardiogram was done on February 25th, this year and it showed diastolic dysfunction.  Ejection  fraction is at 70%. CT of the brain, without contrast, reviewed by myself, as noted without any acute intracranial abnormality.  There is a possible aneurysm of the basilar tip. Further evaluation with CTA is recommended. Patient is admitted for further evaluation and treatment.      VITAL SIGNS:  Blood pressure 137/74, pulse 60, temperature 97.7 F (36.5 C), temperature source Oral, resp. rate 16, height 5\' 2"  (1.575 m), weight 54.4 kg (119 lb 14 oz), SpO2 94 %.  I/O:  No intake or output data in the 24 hours ending 08/23/17 1017  PHYSICAL EXAMINATION:  GENERAL:  67 y.o.-year-old patient lying in the bed with no acute distress.  EYES: Pupils equal, round, reactive to light and accommodation. No scleral icterus. Extraocular muscles intact.  HEENT: Head atraumatic, normocephalic. Oropharynx and nasopharynx clear.  NECK:  Supple, no jugular venous distention. No thyroid enlargement, no tenderness.  LUNGS: Normal breath sounds bilaterally, no wheezing, rales,rhonchi or crepitation. No use of accessory muscles of respiration.  CARDIOVASCULAR: S1, S2 normal. No murmurs, rubs, or gallops.  ABDOMEN: Soft, non-tender, non-distended. Bowel sounds present. No organomegaly or mass.  EXTREMITIES: No pedal edema, cyanosis, or clubbing.  NEUROLOGIC: Cranial nerves II through XII are intact. Muscle strength 5/5 in all extremities. Sensation intact. Gait not checked.  PSYCHIATRIC: The patient is alert and oriented x 3.  SKIN: No obvious rash, lesion, or ulcer.   DATA REVIEW:   CBC No results for input(s): WBC, HGB, HCT, PLT in the last 168 hours.  Chemistries  No results for input(s): NA, K, CL, CO2, GLUCOSE, BUN, CREATININE, CALCIUM, MG, AST, ALT, ALKPHOS, BILITOT in the last 168 hours.  Invalid input(s): GFRCGP  Cardiac Enzymes No results for input(s): TROPONINI in the last 168 hours.  Microbiology Results  Results for orders placed or performed during the hospital encounter of 02/25/17   Urine Culture     Status: Abnormal   Collection Time: 02/25/17 11:40 AM  Result Value Ref Range Status   Specimen Description URINE, CLEAN CATCH  Final   Special Requests Normal  Final   Culture MULTIPLE SPECIES PRESENT, SUGGEST RECOLLECTION (A)  Final   Report Status 02/27/2017 FINAL  Final    RADIOLOGY:  No results found.  EKG:   Orders placed or performed during the hospital encounter of 08/15/17  . ED EKG  . ED EKG  . EKG      Management plans discussed with the patient, family and they are in agreement.  CODE STATUS:  Code Status History    Date Active Date Inactive Code Status Order ID Comments User Context   08/16/2017 00:14 08/16/2017 18:44 Full Code [id number]  Amelia Jo, MD Inpatient   02/25/2017 16:53 02/26/2017 13:13 Full Code [id number]  Idelle Crouch, MD Inpatient      TOTAL TIME TAKING CARE OF THIS PATIENT: 35 minutes.    Vaughan Basta M.D on 08/23/2017 at 10:17 AM  Between 7am to 6pm - Pager - [phone]  After 6pm go to [email or web address] - password [email or web address] [facility]  Office  [phone]  CC: Primary care physician; Mikey College, NP   Note: This dictation was prepared with Dragon dictation along with smaller phrase technology. Any transcriptional errors that result from this process are unintentional.

## 2017-08-25 NOTE — Telephone Encounter (Signed)
H  I am late getting to this She has had subclinical Afib on her pacemaker, but now that she has a TIA, she should probably be started on anticoagulation  Can we have her come in next Tuesday AM plz Thanks and have a great weekend

## 2017-08-25 NOTE — Telephone Encounter (Signed)
She is technically a [facility] patient since she can only come in for appointments on mondays- she is currently scheduled to see you on 09/11/17- is that ok or does she need to come in sooner?  You're back in [place] on Monday 09/04/17. I don't see that you, Amber, or Renee are in clinic next Monday- I checked [facility] and [employer] schedule here- Gerald Stabs doesn't have a schedule and [facility] is full.

## 2017-08-28 ENCOUNTER — Encounter: Payer: Self-pay | Admitting: Nurse Practitioner

## 2017-08-28 ENCOUNTER — Ambulatory Visit (INDEPENDENT_AMBULATORY_CARE_PROVIDER_SITE_OTHER): Payer: Medicare Other | Admitting: Nurse Practitioner

## 2017-08-28 ENCOUNTER — Other Ambulatory Visit: Payer: Self-pay

## 2017-08-28 ENCOUNTER — Encounter: Payer: Self-pay | Admitting: Internal Medicine

## 2017-08-28 VITALS — BP 165/92 | HR 60 | Temp 98.0°F | Ht 62.0 in | Wt 129.4 lb

## 2017-08-28 DIAGNOSIS — Z8673 Personal history of transient ischemic attack (TIA), and cerebral infarction without residual deficits: Secondary | ICD-10-CM | POA: Diagnosis not present

## 2017-08-28 DIAGNOSIS — Z09 Encounter for follow-up examination after completed treatment for conditions other than malignant neoplasm: Secondary | ICD-10-CM | POA: Diagnosis not present

## 2017-08-28 DIAGNOSIS — F3162 Bipolar disorder, current episode mixed, moderate: Secondary | ICD-10-CM

## 2017-08-28 DIAGNOSIS — J40 Bronchitis, not specified as acute or chronic: Secondary | ICD-10-CM | POA: Diagnosis not present

## 2017-08-28 MED ORDER — BENZONATATE 100 MG PO CAPS: 100.0000 mg | ORAL_CAPSULE | Freq: Two times a day (BID) | ORAL | 0 refills | Status: DC | PRN

## 2017-08-28 NOTE — Patient Instructions (Addendum)
Lori Orr, Thank you for coming in to clinic today.  1. Continue all medications that are on your list.  2. For your cold: - You may have some bronchitis.  START taking benzonatate 100mg  capsule twice daily as needed for your cough. - START loratadine 10 mg once daily.  Take this until your allergies/congestion and cough are improved.  Please schedule a follow-up appointment with Cassell Smiles, AGNP. Return in about 3 months (around 11/28/2017) for Cholesterol, hypertension.  If you have any other questions or concerns, please feel free to call the clinic or send a message through [facility]. You may also schedule an earlier appointment if necessary.  You will receive a survey after today's visit either digitally by e-mail or paper by [employer]. Your experiences and feedback matter to [place].  Please respond so we know how we are doing as we provide care for you.   Cassell Smiles, DNP, AGNP-BC Adult Gerontology Nurse Practitioner [facility]

## 2017-08-28 NOTE — Telephone Encounter (Signed)
Appt scheduled

## 2017-08-28 NOTE — Telephone Encounter (Signed)
Lori Orr  Could you please schedule Thursday 9am  thnks

## 2017-08-28 NOTE — Progress Notes (Signed)
Subjective:    Patient ID: Lori Orr, female    DOB: 04-27-1951, 67 y.o.   MRN: [id number]  Lori Orr is a 67 y.o. female presenting on 08/28/2017 for Hospitalization Follow-up (TIA)   HPI HOSPITAL FOLLOW-UP VISIT Hospital/Location: [facility] Date of Admission: 08/15/2017 Date of Discharge: 08/16/2017 Transitions of care telephone call: 08/18/2017  Reason for Admission: Slurred speech, elevated troponin Primary (+Secondary) Diagnosis: TIA (Cerebral aneurysm of basilar tip,NSTEMI, Diastolic CHF, Hyperrtension) - Hospital H&P and Discharge Summary have been reviewed.    Interval history:  - Patient presents today 12 days after recent hospitalization. Brief summary of recent course, patient had symptoms of TIA with slurred speech, hospitalized and discharged 1 day later.  Pt reports she was unhappy with the care provided as she had been told she was discharged prior to neurology evaluation. - Today reports overall has done well after discharge. Symptoms of slurred speech have resolved and have not occurred again. - New medications on discharge: Atorvastatin 20 mg once daily. - Changes to current meds on discharge: none.  URI symptoms Has had cough at night for last several months.  Has had fever, chills, and sweats, but none in last several weeks.  She reports nasal congestion, rhinorrhea, sinus congestion, itchy eyes, sneezing, scratchy throat.   Social History   Tobacco Use  . Smoking status: Current Every Day Smoker    Packs/day: 1.00    Years: 50.00    Pack years: 50.00    Types: Cigarettes  . Smokeless tobacco: Never Used  Substance Use Topics  . Alcohol use: No  . Drug use: No    Review of Systems Per HPI unless specifically indicated above  Outpatient Encounter Medications as of 08/28/2017  Medication Sig Note  . atorvastatin (LIPITOR) 20 MG tablet Take 1 tablet (20 mg total) by mouth daily.   Marland Kitchen buPROPion (WELLBUTRIN XL) 300 MG 24 hr tablet Take 1 tablet (300 mg  total) by mouth daily.   Marland Kitchen lisinopril (PRINIVIL,ZESTRIL) 40 MG tablet Take 1 tablet (40 mg total) by mouth daily.   . metoprolol succinate (TOPROL-XL) 100 MG 24 hr tablet Take 1 tablet (100 mg total) by mouth daily.   Marland Kitchen aspirin EC 81 MG tablet Take 1 tablet (81 mg total) by mouth daily. (Patient not taking: Reported on 08/28/2017)   . diclofenac sodium (VOLTAREN) 1 % GEL Apply 2 g 4 (four) times daily as needed topically (moderate wrist pain). (Patient not taking: Reported on 08/28/2017)   . dicyclomine (BENTYL) 20 MG tablet Take 1 tablet (20 mg total) by mouth 3 (three) times daily as needed (abdominal pain). (Patient not taking: Reported on 08/28/2017)   . Pancrelipase, Lip-Prot-Amyl, 6000 units CPEP Take 1 capsule (6,000 Units total) by mouth 3 (three) times daily with meals. (Patient not taking: Reported on 08/15/2017) 08/15/2017: Waiting for doctor appointment get RX, will be in April.   . verapamil (CALAN-SR) 240 MG CR tablet Take 1 tablet (240 mg total) by mouth at bedtime. (Patient not taking: Reported on 08/28/2017) 08/15/2017: Pt is worried about THIS medication, Stated after she took this medication she had stroke like symptoms.    No facility-administered encounter medications on file as of 08/28/2017.       Objective:    BP (!) 165/92 (BP Location: Right Arm, Patient Position: Sitting, Cuff Size: Normal)   Pulse 60   Temp 98 F (36.7 C) (Oral)   Ht 5\' 2"  (1.575 m)   Wt 129 lb 6.4 oz (58.7 kg)  BMI 23.67 kg/m   Wt Readings from Last 3 Encounters:  08/28/17 129 lb 6.4 oz (58.7 kg)  08/16/17 119 lb 14 oz (54.4 kg)  07/31/17 130 lb (59 kg)    Physical Exam  Constitutional: She is oriented to person, place, and time. She appears well-developed and well-nourished. No distress.  Neck: Normal range of motion. Neck supple. Carotid bruit is not present.  Cardiovascular: Normal rate, regular rhythm, S1 normal, S2 normal, normal heart sounds and intact distal pulses.  Pulmonary/Chest: Effort  normal and breath sounds normal. No respiratory distress.  Musculoskeletal: She exhibits no edema (pedal).  Neurological: She is alert and oriented to person, place, and time. She has normal strength and normal reflexes. No cranial nerve deficit or sensory deficit. She displays a negative Romberg sign. Coordination and gait normal.  Mild residual left-sided facial drooping noted most with smile  Skin: Skin is warm and dry.  Psychiatric: She has a normal mood and affect. Her behavior is normal.  Vitals reviewed.   Results for orders placed or performed during the hospital encounter of 08/15/17  Protime-INR  Result Value Ref Range   Prothrombin Time 12.0 11.4 - 15.2 seconds   INR 0.89   APTT  Result Value Ref Range   aPTT 26 24 - 36 seconds  CBC  Result Value Ref Range   WBC 6.6 3.6 - 11.0 K/uL   RBC 5.08 3.80 - 5.20 MIL/uL   Hemoglobin 14.6 12.0 - 16.0 g/dL   HCT 44.1 35.0 - 47.0 %   MCV 86.9 80.0 - 100.0 fL   MCH 28.7 26.0 - 34.0 pg   MCHC 33.0 32.0 - 36.0 g/dL   RDW 13.7 11.5 - 14.5 %   Platelets 171 150 - 440 K/uL  Differential  Result Value Ref Range   Neutrophils Relative % 59 %   Neutro Abs 3.9 1.4 - 6.5 K/uL   Lymphocytes Relative 27 %   Lymphs Abs 1.8 1.0 - 3.6 K/uL   Monocytes Relative 10 %   Monocytes Absolute 0.7 0.2 - 0.9 K/uL   Eosinophils Relative 3 %   Eosinophils Absolute 0.2 0 - 0.7 K/uL   Basophils Relative 1 %   Basophils Absolute 0.1 0 - 0.1 K/uL  Comprehensive metabolic panel  Result Value Ref Range   Sodium 139 135 - 145 mmol/L   Potassium 4.1 3.5 - 5.1 mmol/L   Chloride 108 101 - 111 mmol/L   CO2 22 22 - 32 mmol/L   Glucose, Bld 118 (H) 65 - 99 mg/dL   BUN 19 6 - 20 mg/dL   Creatinine, Ser 0.87 0.44 - 1.00 mg/dL   Calcium 9.1 8.9 - 10.3 mg/dL   Total Protein 7.1 6.5 - 8.1 g/dL   Albumin 4.0 3.5 - 5.0 g/dL   AST 24 15 - 41 U/L   ALT 19 14 - 54 U/L   Alkaline Phosphatase 67 38 - 126 U/L   Total Bilirubin 0.7 0.3 - 1.2 mg/dL   GFR calc non Af  Amer >60 >60 mL/min   GFR calc Af Amer >60 >60 mL/min   Anion gap 9 5 - 15  Troponin I  Result Value Ref Range   Troponin I 0.09 (HH) <0.03 ng/mL  Basic metabolic panel  Result Value Ref Range   Sodium 140 135 - 145 mmol/L   Potassium 3.8 3.5 - 5.1 mmol/L   Chloride 109 101 - 111 mmol/L   CO2 24 22 - 32 mmol/L   Glucose,  Bld 116 (H) 65 - 99 mg/dL   BUN 22 (H) 6 - 20 mg/dL   Creatinine, Ser 0.85 0.44 - 1.00 mg/dL   Calcium 8.8 (L) 8.9 - 10.3 mg/dL   GFR calc non Af Amer >60 >60 mL/min   GFR calc Af Amer >60 >60 mL/min   Anion gap 7 5 - 15  CBC  Result Value Ref Range   WBC 5.4 3.6 - 11.0 K/uL   RBC 4.66 3.80 - 5.20 MIL/uL   Hemoglobin 13.4 12.0 - 16.0 g/dL   HCT 40.9 35.0 - 47.0 %   MCV 87.8 80.0 - 100.0 fL   MCH 28.9 26.0 - 34.0 pg   MCHC 32.9 32.0 - 36.0 g/dL   RDW 14.0 11.5 - 14.5 %   Platelets 171 150 - 440 K/uL  Troponin I  Result Value Ref Range   Troponin I 0.08 (HH) <0.03 ng/mL  Glucose, capillary  Result Value Ref Range   Glucose-Capillary 95 65 - 99 mg/dL  Lipid panel  Result Value Ref Range   Cholesterol 222 (H) 0 - 200 mg/dL   Triglycerides 240 (H) <150 mg/dL   HDL 48 >40 mg/dL   Total CHOL/HDL Ratio 4.6 RATIO   VLDL 48 (H) 0 - 40 mg/dL   LDL Cholesterol 126 (H) 0 - 99 mg/dL  Glucose, capillary  Result Value Ref Range   Glucose-Capillary 116 (H) 65 - 99 mg/dL      Assessment & Plan:   Problem List Items Addressed This Visit      Other   Bipolar disorder, current episode mixed, moderate ([place]) Patient reports she is ready to reconsider referral to psychiatry.  She plans to remain in [place] area.  Plan: 1.  New referral placed to psychiatry for [facility]. 2.  Follow-up as needed   Relevant Orders   Ambulatory referral to Psychiatry    Other Visit Diagnoses    Hospital discharge follow-up    -  Primary   History of TIA (transient ischemic attack)     Patient has done well since hospital follow-up.  Cardiology  interrogation of pacemaker indicated patient has had episodes of rapid atrial fibrillation.  Patient has follow-up with cardiology upcoming in the next several days.  Patient will likely start anticoagulation to prevent possible embolic stroke.  Plan: 1.  No additional changes today.  Reassured patient she should continue taking her verapamil until cardiology appointment. 2.  Continue follow-up with cardiology 3.  Reviewed signs and symptoms of stroke with patient today instructed to call 911 if they occur.    Bronchitis     Acute illness. Fever responsive to NSAIDs and tylenol.  Symptoms not worsening. Consistent with viral illness x 14 days with no known sick contacts and no identifiable focal infections of ears, nose, throat.  Plan: 1. Reassurance, likely self-limited with cough lasting up to few weeks - Start anti-histamine Loratadine 10mg  daily,  - also can use Flonase 2 sprays each nostril daily for up to 4-6 weeks - Start Mucinex-DM OTC up to 7-10 days then stop - Start tessalon perles 100 mg every 12 hours as needed for cough. 2. Supportive care with nasal saline, warm herbal tea with honey, 3. Improve hydration 4. Tylenol / Motrin PRN fevers 5. Return criteria given    Relevant Medications   benzonatate (TESSALON) 100 MG capsule      Meds ordered this encounter  Medications  . benzonatate (TESSALON) 100 MG capsule    Sig: Take  1 capsule (100 mg total) by mouth 2 (two) times daily as needed for cough.    Dispense:  20 capsule    Refill:  0    Order Specific Question:   Supervising Provider    Answer:   Olin Hauser [[id number]]    I have reviewed the admission H&P, hospital notes, discharge summary, discharge medication list, and have reconciled the current and discharge medications today.  Follow up plan: Return in about 3 months (around 11/28/2017) for Cholesterol, hypertension.  Cassell Smiles, DNP, AGPCNP-BC Adult Gerontology Primary Care Nurse  Practitioner [facility] 08/28/2017, 1:48 PM

## 2017-08-31 ENCOUNTER — Encounter: Payer: Self-pay | Admitting: Internal Medicine

## 2017-08-31 ENCOUNTER — Ambulatory Visit (INDEPENDENT_AMBULATORY_CARE_PROVIDER_SITE_OTHER): Payer: Medicare Other | Admitting: Internal Medicine

## 2017-08-31 VITALS — BP 112/60 | HR 60 | Ht 62.0 in | Wt 129.0 lb

## 2017-08-31 DIAGNOSIS — I495 Sick sinus syndrome: Secondary | ICD-10-CM | POA: Diagnosis not present

## 2017-08-31 DIAGNOSIS — I480 Paroxysmal atrial fibrillation: Secondary | ICD-10-CM | POA: Diagnosis not present

## 2017-08-31 DIAGNOSIS — G459 Transient cerebral ischemic attack, unspecified: Secondary | ICD-10-CM | POA: Diagnosis not present

## 2017-08-31 DIAGNOSIS — Z950 Presence of cardiac pacemaker: Secondary | ICD-10-CM

## 2017-08-31 MED ORDER — ATORVASTATIN CALCIUM 40 MG PO TABS: 40.0000 mg | ORAL_TABLET | Freq: Every day | ORAL | Status: DC

## 2017-08-31 MED ORDER — APIXABAN 5 MG PO TABS: 5.0000 mg | ORAL_TABLET | Freq: Two times a day (BID) | ORAL | 11 refills | Status: DC

## 2017-08-31 NOTE — Progress Notes (Signed)
ELECTROPHYSIOLOGY OFFICE  NOTE  Patient ID: Lori Orr, MRN: [id number], DOB/AGE: July 04, 1950 67 y.o. Admit date: (Not on file) Date of Consult: 08/31/2017  Primary Physician: Mikey College, NP Primary Cardiologist: New      HPI Lori Orr is a 67 y.o. female  Seen with previously detected afib on monitor, then classified as SCAF.  Unfortunately intercurrently with TIA/CVA  She has pacemaker implanted 2012 having presented w Syncope.  Seen at [facility].  Eval incl Echo LVEF normal with concentric LVH and cavity obliteration;  Tilt table normal.  Myoview-Lexi was normal    Pacing eliminated syncope  She has poorly controlled hypertension. She has been on a complex medical regime including high-dose metoprolol, amlodipine and lisinopril. She has run out of her medications.  She has intermittent chest pains.  She was seen 9/18 at [facility] by Dr. Lenore Cordia. She had nonspecific T-wave changes. Borderline troponins. Not felt to be ACS. Her chest pain story is inconsistent with cardiac disease with episodes lasting hours and hours.  She has chronic short dyspnea on exertion.  Hospital records reviewed   Admitted 3/19 with slurred speech and facial symmetry   CT neg  Past Medical History:  Diagnosis Date  . Bradycardia   . Cancer ([place])    ovarian cancer  oophrectomy w/ only "1/4 ovary remaining"  . CHF (congestive heart failure) ([place])   . Chronic kidney disease   . Depression   . Hypertension   . Pancreatitis   . SSS (sick sinus syndrome) ([place])    PPM implant 03/08/11 - [facility] [id number]      Surgical History:  Past Surgical History:  Procedure Laterality Date  . ABDOMINAL HYSTERECTOMY    . APPENDECTOMY    . PACEMAKER IMPLANT  03/08/2011   [employer] [id number] [phone]) - Implanted For SSS     Home Meds: Prior to Admission medications   Medication Sig Start Date End Date Taking? Authorizing Provider  amLODipine (NORVASC) 10 MG tablet Take  10 mg by mouth daily.    [provider]  amoxicillin-clavulanate (AUGMENTIN) 875-125 MG tablet Take 1 tablet by mouth 2 (two) times daily. For 7 days Patient not taking: Reported on 04/09/2017 03/13/17   Olin Hauser, DO  ciprofloxacin (CIPRO) 500 MG tablet Take 1 tablet (500 mg total) by mouth 2 (two) times daily. Patient not taking: Reported on 04/09/2017 04/03/17 04/13/17  Gregor Hams, MD  ciprofloxacin-dexamethasone [facility]) OTIC suspension Place 4 drops into the left ear 2 (two) times daily. 03/13/17   Karamalegos, Devonne Doughty, DO  cyclobenzaprine (FLEXERIL) 10 MG tablet Take 1 tablet (10 mg total) by mouth 3 (three) times daily as needed for muscle spasms. Patient not taking: Reported on 04/09/2017 03/13/17   Olin Hauser, DO  dicyclomine (BENTYL) 10 MG capsule Take 1 capsule (10 mg total) by mouth 4 (four) times daily -  before meals and at bedtime. Patient not taking: Reported on 04/09/2017 04/03/17 04/17/17  Gregor Hams, MD  ipratropium (ATROVENT) 0.06 % nasal spray Place 2 sprays into both nostrils 4 (four) times daily. For up to 5-7 days then stop. Patient not taking: Reported on 04/09/2017 03/13/17   Olin Hauser, DO  lisinopril (PRINIVIL,ZESTRIL) 40 MG tablet Take 40 mg by mouth daily.    [provider]  metoprolol (TOPROL-XL) 200 MG 24 hr tablet Take 200 mg by mouth daily.    [provider]  metroNIDAZOLE (FLAGYL) 500 MG  tablet Take 1 tablet (500 mg total) by mouth 2 (two) times daily. Patient not taking: Reported on 04/09/2017 04/03/17 04/13/17  Gregor Hams, MD  promethazine (PHENERGAN) 12.5 MG tablet Take 1 tablet (12.5 mg total) by mouth every 6 (six) hours as needed for nausea or vomiting. 01/27/17   Merlyn Lot, MD    Current Meds  Medication Sig  . benzonatate (TESSALON) 100 MG capsule Take 1 capsule (100 mg total) by mouth 2 (two) times daily as needed for cough.  Marland Kitchen buPROPion (WELLBUTRIN XL)  300 MG 24 hr tablet Take 1 tablet (300 mg total) by mouth daily.  Marland Kitchen lisinopril (PRINIVIL,ZESTRIL) 40 MG tablet Take 1 tablet (40 mg total) by mouth daily.  . metoprolol succinate (TOPROL-XL) 100 MG 24 hr tablet Take 1 tablet (100 mg total) by mouth daily.  . Pancrelipase, Lip-Prot-Amyl, 6000 units CPEP Take 1 capsule (6,000 Units total) by mouth 3 (three) times daily with meals.  . verapamil (CALAN-SR) 240 MG CR tablet Take 1 tablet (240 mg total) by mouth at bedtime.  . [DISCONTINUED] atorvastatin (LIPITOR) 20 MG tablet Take 1 tablet (20 mg total) by mouth daily.     Allergies:  Allergies  Allergen Reactions  . Sulfa Antibiotics Rash  . Acetaminophen   . Trazodone And Nefazodone Other (See Comments)     Severe nightmares    Social History   Socioeconomic History  . Marital status: Divorced    Spouse name: Not on file  . Number of children: 1  . Years of education: Not on file  . Highest education level: Not on file  Occupational History  . 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  Social Needs  . Financial resource strain: Not hard at all  . Food insecurity:    Worry: Never true    Inability: Never true  . Transportation needs:    Medical: Yes    Non-medical: No  Tobacco Use  . Smoking status: Current Every Day Smoker    Packs/day: 1.00    Years: 50.00    Pack years: 50.00    Types: Cigarettes  . Smokeless tobacco: Never Used  . Tobacco comment: one pack every 3 days  Substance and Sexual Activity  . Alcohol use: No  . Drug use: No  . Sexual activity: Not Currently  Lifestyle  . Physical activity:    Days per week: 0 days    Minutes per session: 0 min  . Stress: Not at all  Relationships  . Social connections:    Talks on phone: Never    Gets together: Never    Attends religious service: Never    Active member of club or organization: No    Attends meetings of clubs or organizations: Never    Relationship status:  Divorced  . Intimate partner violence:    Fear of current or ex partner: No    Emotionally abused: No    Physically abused: No    Forced sexual activity: No  Other Topics Concern  . Not on file  Social History Narrative   Moving in with her daughter.      Family History  Problem Relation Age of Onset  . Dementia Mother   . Heart attack Father        Died age 11  . Heart disease Sister        Congential  . Heart disease Sister        "Some kind of heart surgery."  .  Fibromyalgia Daughter      ROS:  Please see the history of present illness.     All other systems reviewed and negative.    Physical Exam  Blood pressure 112/60, pulse 60, height 5\' 2"  (1.575 m), weight 129 lb (58.5 kg). Well developed and nourished in no acute distress HENT normal  Persistent ptosis Neck supple with JVP-flat Carotids brisk and full without bruits Clear Regular rate and rhythm, no murmurs or gallops Abd-soft with active BS without hepatomegaly No Clubbing cyanosis edema Skin-warm and dry A & Oriented  Grossly normal sensory and motor function  Facial asymmetry      Labs: Cardiac Enzymes No results for input(s): CKTOTAL, CKMB, TROPONINI in the last 72 hours. CBC Lab Results  Component Value Date   WBC 5.4 08/16/2017   HGB 13.4 08/16/2017   HCT 40.9 08/16/2017   MCV 87.8 08/16/2017   PLT 171 08/16/2017   PROTIME: No results for input(s): LABPROT, INR in the last 72 hours. Chemistry  No results for input(s): NA, K, CL, CO2, BUN, CREATININE, CALCIUM, PROT, BILITOT, ALKPHOS, ALT, AST, GLUCOSE in the last 168 hours.  Invalid input(s): LABALBU Lipids Lab Results  Component Value Date   CHOL 222 (H) 08/16/2017   HDL 48 08/16/2017   LDLCALC 126 (H) 08/16/2017   TRIG 240 (H) 08/16/2017   BNP No results found for: PROBNP Thyroid Function Tests: No results for input(s): TSH, T4TOTAL, T3FREE, THYROIDAB in the last 72 hours.  Invalid input(s): FREET3 Miscellaneous No results  found for: DDIMER  Radiology/Studies:  Ct Angio Head W Or Wo Contrast  Result Date: 08/16/2017 CLINICAL DATA:  Aneurysm of the basilar tip questioned on previous CT. Episode of slurred speech yesterday. EXAM: CT ANGIOGRAPHY HEAD TECHNIQUE: Multidetector CT imaging of the head was performed using the standard protocol during bolus administration of intravenous contrast. Multiplanar CT image reconstructions and MIPs were obtained to evaluate the vascular anatomy. CONTRAST:  57mL ISOVUE-370 IOPAMIDOL (ISOVUE-370) INJECTION 76% COMPARISON:  CT 08/15/2017 FINDINGS: CT HEAD Brain: No change since yesterday. Mild generalized atrophy. Mild chronic appearing small vessel changes of the basal ganglia. No sign of acute infarction, mass lesion, hemorrhage, hydrocephalus or extra-axial collection. Vascular: Atherosclerotic changes.  See below. Skull: Negative Sinuses: Clear Orbits: Negative CTA HEAD Anterior circulation: Both internal carotid arteries are patent through the skull base and siphon regions. There is atherosclerotic disease of the carotid siphon regions with tortuosity and peripheral calcification. No visible flow limiting stenosis. Anterior and middle cerebral vessels are patent without proximal stenosis, aneurysm or vascular malformation. Posterior circulation: Both vertebral arteries are patent through the foramen magnum to the basilar. Both posterior inferior cerebellar arteries show flow. The basilar artery is tortuous with dolichoectasia of the basilar tip but no true berry aneurysm. Posterior circulation branch vessels appear normal. Venous sinuses: Patent and normal. Anatomic variants: None significant Delayed phase: No abnormal enhancement. IMPRESSION: Atherosclerotic dolichoectasia and tortuosity of the vessels as might be seen with chronic hypertension/vascular disease. Basilar tip shows dolichoectasia but there is no true aneurysm. No intracranial stenosis or large/medium vessel occlusion identified.  Electronically Signed   By: Nelson Chimes M.D.   On: 08/16/2017 09:18   Ct Head Wo Contrast  Result Date: 08/15/2017 CLINICAL DATA:  Slurred speech which and eye twitching EXAM: CT HEAD WITHOUT CONTRAST TECHNIQUE: Contiguous axial images were obtained from the base of the skull through the vertex without intravenous contrast. COMPARISON:  None. FINDINGS: Brain: No acute territorial infarction, hemorrhage, or intracranial mass is  visualized. The ventricles are nonenlarged. Vascular: No hyperdense vessels. Carotid vascular calcification. Possible mild aneurysmal dilatation of the tip of the basilar artery. Skull: No fracture or suspicious lesion Sinuses/Orbits: Minimal mucosal thickening in the sphenoid and ethmoid sinuses. No acute orbital abnormality Other: None IMPRESSION: 1. No CT evidence for acute intracranial abnormality. 2. 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 Further evaluation with CTA is recommended. Electronically Signed   By: Donavan Foil M.D.   On: 08/15/2017 18:44   US Carotid Bilateral  Result Date: 08/16/2017 CLINICAL DATA:  67 year old female with a history of TIA. Cardiovascular risk factors include hypertension, stroke/TIA, tobacco use EXAM: BILATERAL CAROTID DUPLEX ULTRASOUND TECHNIQUE: Pearline Cables scale imaging, color Doppler and duplex ultrasound were performed of bilateral carotid and vertebral arteries in the neck. COMPARISON:  No prior duplex FINDINGS: Criteria: Quantification of carotid stenosis is based on velocity parameters that correlate the residual internal carotid diameter with NASCET-based stenosis levels, using the diameter of the distal internal carotid lumen as the denominator for stenosis measurement. The following velocity measurements were obtained: RIGHT ICA:  Systolic 90 cm/sec, Diastolic 35 cm/sec CCA:  69 cm/sec SYSTOLIC ICA/CCA RATIO:  1.5 ECA:  80 cm/sec LEFT ICA:  Systolic 84 cm/sec, Diastolic per 33 cm/sec CCA:  81 cm/sec SYSTOLIC ICA/CCA RATIO:  1.2 ECA:  88 cm/sec  Right Brachial SBP: Not acquired Left Brachial SBP: Not acquired RIGHT CAROTID ARTERY: No significant calcifications of the right common carotid artery. Intermediate waveform maintained. Heterogeneous and partially calcified plaque at the right carotid bifurcation. No significant lumen shadowing. Low resistance waveform of the right ICA. No significant tortuosity. RIGHT VERTEBRAL ARTERY: Antegrade flow with low resistance waveform. LEFT CAROTID ARTERY: No significant calcifications of the left common carotid artery. Intermediate waveform maintained. Heterogeneous and partially calcified plaque at the left carotid bifurcation without significant lumen shadowing. Low resistance waveform of the left ICA. No significant tortuosity. LEFT VERTEBRAL ARTERY:  Antegrade flow with low resistance waveform. IMPRESSION: Color duplex indicates minimal heterogeneous and calcified plaque, with no hemodynamically significant stenosis by duplex criteria in the extracranial cerebrovascular circulation. Signed, Dulcy Fanny. Earleen Newport, DO [facility] Radiology Electronically Signed   By: Corrie Mckusick D.O.   On: 08/16/2017 10:11    ECG  A pacing @ 60 24/ 24 As sessment and Plan:  Syncope  Pacemaker-[employer]  Hypertension  Dyspnea on exertion  COPD  Chest pain  Atrial fibrillation-subclinical  TIA/CVA  BP well controlled   With intercurrent CVA ( residual asymmetry) and documented atrial fibrillation, appropriate that she be on anticoagulation   We discussed the use of the NOACs compared to Coumadin. We briefly reviewed the data of at least comparability in stroke prevention, bleeding and outcome. We discussed some of the new once wherein somewhat associated with decreased ischemic stroke risk, one to be taken daily, and has been shown to be comparable and bleeding risk to aspirin.  We also discussed bleeding associated with warfarin as well as NOACs and a  wall bleeding as a complication of all these drugs intracranial bleeding is more frequently associated with warfarin then the NOACs and a GI bleeding is more commonly associated with the latter  Will begin apixoban at 5 mg bid and have given her pt assistance information   We will increase atorvastatin 20>>40 per SPARCL  Also she had concerns re premature hospital discharge which I have forwarded to [facility] admin   She is working on stopping smoking \ No syncope  More than 50%  of 40 min was spent in counseling related to the above    Virl Axe

## 2017-08-31 NOTE — Patient Instructions (Addendum)
Medication Instructions: - Your physician has recommended you make the following change in your medication:   1) INCREASE lipitor (atrovastatin) to 40 mg- take 1 tablet by mouth once daily 2) START eliquis 5 mg- take 1 tablet by mouth TWICE daily    -you have been given a "Free 30-day trial card" to use for the Eliquis- please all the # on the card to activate this prior to taking this to the pharmacy with your prescription.    - if the eliquis is too expensive or your plan does not cover this, then call the office and we will switch you to Xarelto 20 mg once daily ( you are being given a "Free 30-day trial card" for this today- please hold on to the card in case we need to switch you)  Labwork: - none ordered  Procedures/Testing: - none ordered  Follow-Up: - Your physician recommends that you keep your follow-up appointment: 10/09/17 with the [facility])  - Your physician wants you to follow-up in:  September 2019 with Dr. [name]. You will receive a reminder letter in the mail two months in advance. If you don't receive a letter, please call our office to schedule the follow-up appointment.   Any Additional Special Instructions Will Be Listed Below (If Applicable).     If you need a refill on your cardiac medications before your next appointment, please call your pharmacy.

## 2017-09-05 ENCOUNTER — Telehealth: Payer: Self-pay | Admitting: Internal Medicine

## 2017-09-05 DIAGNOSIS — K861 Other chronic pancreatitis: Secondary | ICD-10-CM | POA: Diagnosis not present

## 2017-09-05 NOTE — Telephone Encounter (Signed)
Dr. [name], you just saw this pt on 3/21. She is planned for extract with pancreatic cytology and they are requesting interruption in anticoagulation and clearance.   Dr. Wynetta Emery of GI is requesting for you to call him.  [phone]

## 2017-09-05 NOTE — Telephone Encounter (Signed)
° °  [facility] Pre-operative Risk Assessment    Request for surgical clearance:  1. What type of surgery is being performed? Extract with Pancreatic cytology  When is this surgery scheduled? Semi- urgently depending on communication with Dr.Klein   2. What type of clearance is required (medical clearance vs. Pharmacy clearance to hold med vs. Both)? both  3. Are there any medications that need to be held prior to surgery and how long? anticontagion   4. Practice name and name of physician performing surgery? Dr. Wynetta Emery GI   5. What is your office phone and fax number? [phone]  ( The doctor called himself and wants Dr.Klein to call him)  Anesthesia type (None, local, MAC, general) ? General   Lenon Curt 09/05/2017, 1:22 PM  _________________________________________________________________   (provider comments below)

## 2017-09-06 ENCOUNTER — Telehealth: Payer: Self-pay | Admitting: Student

## 2017-09-06 NOTE — Telephone Encounter (Signed)
Called patient and she is feeling better no more nausea and needed clearance from cardiology not PCP. She will call office if she has any question.

## 2017-09-06 NOTE — Telephone Encounter (Signed)
Pt is waiting for clearance from cardio for her surgery but is in a lot of pain and throwing up.  Please call [phone]

## 2017-09-07 NOTE — Telephone Encounter (Signed)
Dr. [name] called and spoke with Dr. Erasmo Downer. He advised that it would be ideal to wait 12 weeks prior to stopping anticoagulation, but if he felt it was more urgent than that then to wait at least 4-6 weeks prior to stopping anticoagulation.

## 2017-09-07 NOTE — Telephone Encounter (Signed)
Patient calling to check status of clearance for sx .Please call cell.

## 2017-09-07 NOTE — Telephone Encounter (Signed)
I texted Dr. [name], pt is aware we should hear back soon.

## 2017-09-07 NOTE — Telephone Encounter (Signed)
   Primary Cardiologist: Virl Axe, MD  Chart reviewed as part of pre-operative protocol coverage. Given past medical history and time since last visit, based on ACC/AHA guidelines, Lori Orr would be at acceptable risk for the planned procedure without further cardiovascular testing.  But anticoagulation see below.   Dr. [name] spoke with Dr. Erasmo Downer and advised it would be ideal to wait 12 weeks prior to stopping anticoagulation, but if more urgent then to wait at least 4-6 weeks prior to stopping anticoagulation.   I will route this recommendation to the requesting party via Epic fax function and remove from pre-op pool.  Please call with questions.  Cecilie Kicks, NP 09/07/2017, 3:34 PM

## 2017-09-10 ENCOUNTER — Encounter: Payer: Self-pay | Admitting: Nurse Practitioner

## 2017-09-11 ENCOUNTER — Encounter: Payer: Self-pay | Admitting: Emergency Medicine

## 2017-09-11 ENCOUNTER — Encounter: Payer: Self-pay | Admitting: Nurse Practitioner

## 2017-09-11 ENCOUNTER — Emergency Department
Admission: EM | Admit: 2017-09-11 | Discharge: 2017-09-11 | Disposition: A | Discharge status: Home / Self Care | Payer: Medicare Other | Attending: Student in an Organized Health Care Education/Training Program | Admitting: Student in an Organized Health Care Education/Training Program

## 2017-09-11 ENCOUNTER — Encounter: Payer: Medicare Other | Admitting: Internal Medicine

## 2017-09-11 ENCOUNTER — Other Ambulatory Visit: Payer: Self-pay

## 2017-09-11 ENCOUNTER — Emergency Department: Payer: Medicare Other

## 2017-09-11 ENCOUNTER — Ambulatory Visit (INDEPENDENT_AMBULATORY_CARE_PROVIDER_SITE_OTHER): Payer: Medicare Other | Admitting: Nurse Practitioner

## 2017-09-11 VITALS — BP 177/90 | HR 59 | Temp 97.8°F | Ht 62.0 in | Wt 128.8 lb

## 2017-09-11 DIAGNOSIS — Z8673 Personal history of transient ischemic attack (TIA), and cerebral infarction without residual deficits: Secondary | ICD-10-CM | POA: Insufficient documentation

## 2017-09-11 DIAGNOSIS — M545 Low back pain, unspecified: Secondary | ICD-10-CM

## 2017-09-11 DIAGNOSIS — Z950 Presence of cardiac pacemaker: Secondary | ICD-10-CM | POA: Diagnosis not present

## 2017-09-11 DIAGNOSIS — E7849 Other hyperlipidemia: Secondary | ICD-10-CM | POA: Diagnosis not present

## 2017-09-11 DIAGNOSIS — Z7901 Long term (current) use of anticoagulants: Secondary | ICD-10-CM | POA: Insufficient documentation

## 2017-09-11 DIAGNOSIS — R3129 Other microscopic hematuria: Secondary | ICD-10-CM | POA: Diagnosis not present

## 2017-09-11 DIAGNOSIS — R300 Dysuria: Secondary | ICD-10-CM | POA: Diagnosis not present

## 2017-09-11 DIAGNOSIS — F17210 Nicotine dependence, cigarettes, uncomplicated: Secondary | ICD-10-CM | POA: Diagnosis not present

## 2017-09-11 DIAGNOSIS — R109 Unspecified abdominal pain: Secondary | ICD-10-CM | POA: Diagnosis not present

## 2017-09-11 DIAGNOSIS — I509 Heart failure, unspecified: Secondary | ICD-10-CM | POA: Diagnosis not present

## 2017-09-11 DIAGNOSIS — R1030 Lower abdominal pain, unspecified: Secondary | ICD-10-CM | POA: Insufficient documentation

## 2017-09-11 DIAGNOSIS — I130 Hypertensive heart and chronic kidney disease with heart failure and stage 1 through stage 4 chronic kidney disease, or unspecified chronic kidney disease: Secondary | ICD-10-CM | POA: Insufficient documentation

## 2017-09-11 DIAGNOSIS — N189 Chronic kidney disease, unspecified: Secondary | ICD-10-CM | POA: Insufficient documentation

## 2017-09-11 DIAGNOSIS — N200 Calculus of kidney: Secondary | ICD-10-CM | POA: Diagnosis not present

## 2017-09-11 LAB — POCT URINALYSIS DIPSTICK
Bilirubin, UA: NEGATIVE
Glucose, UA: NEGATIVE
Ketones, UA: NEGATIVE
Leukocytes, UA: NEGATIVE
Nitrite, UA: NEGATIVE
Odor: NEGATIVE
Protein, UA: NEGATIVE
Spec Grav, UA: 1.02 (ref 1.010–1.025)
Urobilinogen, UA: 0.2 E.U./dL
pH, UA: 5 (ref 5.0–8.0)

## 2017-09-11 LAB — URINALYSIS, COMPLETE (UACMP) WITH MICROSCOPIC
Bacteria, UA: NONE SEEN
Bilirubin Urine: NEGATIVE
GLUCOSE, UA: NEGATIVE mg/dL
KETONES UR: NEGATIVE mg/dL
Nitrite: NEGATIVE
Protein, ur: NEGATIVE mg/dL
Specific Gravity, Urine: 1.008 (ref 1.005–1.030)
pH: 6 (ref 5.0–8.0)

## 2017-09-11 LAB — COMPREHENSIVE METABOLIC PANEL
ALT: 26 U/L (ref 14–54)
ANION GAP: 8 (ref 5–15)
AST: 28 U/L (ref 15–41)
Albumin: 4.2 g/dL (ref 3.5–5.0)
Alkaline Phosphatase: 77 U/L (ref 38–126)
BUN: 17 mg/dL (ref 6–20)
CO2: 26 mmol/L (ref 22–32)
Calcium: 10.1 mg/dL (ref 8.9–10.3)
Chloride: 106 mmol/L (ref 101–111)
Creatinine, Ser: 1.08 mg/dL — ABNORMAL HIGH (ref 0.44–1.00)
GFR calc Af Amer: 60 mL/min — ABNORMAL LOW (ref >60)
GFR calc non Af Amer: 52 mL/min — ABNORMAL LOW (ref >60)
GLUCOSE: 104 mg/dL — AB (ref 65–99)
POTASSIUM: 4.4 mmol/L (ref 3.5–5.1)
SODIUM: 140 mmol/L (ref 135–145)
Total Bilirubin: 0.7 mg/dL (ref 0.3–1.2)
Total Protein: 7.7 g/dL (ref 6.5–8.1)

## 2017-09-11 LAB — CBC
HCT: 44.9 % (ref 35.0–47.0)
Hemoglobin: 14.6 g/dL (ref 12.0–16.0)
MCH: 28.9 pg (ref 26.0–34.0)
MCHC: 32.5 g/dL (ref 32.0–36.0)
MCV: 89 fL (ref 80.0–100.0)
Platelets: 213 10*3/uL (ref 150–440)
RBC: 5.05 MIL/uL (ref 3.80–5.20)
RDW: 14.1 % (ref 11.5–14.5)
WBC: 9.4 10*3/uL (ref 3.6–11.0)

## 2017-09-11 LAB — LIPASE, BLOOD: Lipase: 41 U/L (ref 11–51)

## 2017-09-11 MED ORDER — CEPHALEXIN 500 MG PO CAPS
500.0000 mg | ORAL_CAPSULE | Freq: Once | ORAL | Status: AC
  Administered 2017-09-11: 500 mg via ORAL
  Filled 2017-09-11: qty 1

## 2017-09-11 MED ORDER — MORPHINE SULFATE (PF) 4 MG/ML IV SOLN: 4.0000 mg | INTRAVENOUS | Status: DC | PRN

## 2017-09-11 MED ORDER — ONDANSETRON 4 MG PO TBDP
ORAL_TABLET | ORAL | Status: AC
  Administered 2017-09-11: 4 mg via ORAL
  Filled 2017-09-11: qty 1

## 2017-09-11 MED ORDER — MORPHINE SULFATE (PF) 4 MG/ML IV SOLN
INTRAVENOUS | Status: AC
  Filled 2017-09-11: qty 1

## 2017-09-11 MED ORDER — CEPHALEXIN 500 MG PO CAPS
500.0000 mg | ORAL_CAPSULE | Freq: Three times a day (TID) | ORAL | 0 refills | Status: AC
Start: 2017-09-11 — End: 2017-09-16

## 2017-09-11 MED ORDER — ONDANSETRON HCL 4 MG/2ML IJ SOLN: 4.0000 mg | Freq: Once | INTRAMUSCULAR | Status: DC

## 2017-09-11 MED ORDER — MORPHINE SULFATE (PF) 4 MG/ML IV SOLN
4.0000 mg | Freq: Once | INTRAVENOUS | Status: AC
  Administered 2017-09-11: 4 mg via INTRAVENOUS

## 2017-09-11 MED ORDER — TRAMADOL HCL 50 MG PO TABS: 50.0000 mg | ORAL_TABLET | Freq: Four times a day (QID) | ORAL | 0 refills | Status: DC | PRN

## 2017-09-11 MED ORDER — CEPHALEXIN 500 MG PO CAPS: 500.0000 mg | ORAL_CAPSULE | Freq: Two times a day (BID) | ORAL | 0 refills | Status: DC

## 2017-09-11 MED ORDER — ATORVASTATIN CALCIUM 40 MG PO TABS: 40.0000 mg | ORAL_TABLET | Freq: Every day | ORAL | 1 refills | Status: DC

## 2017-09-11 MED ORDER — ONDANSETRON 4 MG PO TBDP
4.0000 mg | ORAL_TABLET | Freq: Once | ORAL | Status: AC
  Administered 2017-09-11: 4 mg via ORAL

## 2017-09-11 NOTE — ED Notes (Signed)
Pt ambulatory upon discharge. Verbalized understanding of discharge instructions, follow-up care and prescriptions. VSS. Skin warm and dry. A&O x4.  

## 2017-09-11 NOTE — ED Triage Notes (Signed)
Bilateral flank pain and dysuria x 2 weeks.

## 2017-09-11 NOTE — ED Provider Notes (Signed)
[facility] Emergency Department Provider Note    First MD Initiated Contact with Patient 09/11/17 1636     (approximate)  I have reviewed the triage vital signs and the nursing notes.   HISTORY  Chief Complaint Flank Pain    HPI Lori Orr is a 67 y.o. female with a history of ovarian cancer as well as CKD status post appendectomy presents with chief complaint of low back pain as well as dysuria.  Patient has been having worsening flank pain and dysuria for the past 2 weeks.  Feels that she has been having fevers at home and chills.  Denies any nausea or vomiting.  States currently the pain is moderate to severe.  Is not taking anything for the pain.  Past Medical History:  Diagnosis Date  . Bradycardia   . Cancer ([place])    ovarian cancer  oophrectomy w/ only "1/4 ovary remaining"  . CHF (congestive heart failure) ([place])   . Chronic kidney disease   . Depression   . Hypertension   . Pancreatitis   . SSS (sick sinus syndrome) ([place])    PPM implant 03/08/11 - [employer] [id number] - SN [id number]   Family History  Problem Relation Age of Onset  . Dementia Mother   . Heart attack Father        Died age 40  . Heart disease Sister        Congential  . Heart disease Sister        "Some kind of heart surgery."  . Fibromyalgia Daughter    Past Surgical History:  Procedure Laterality Date  . ABDOMINAL HYSTERECTOMY    . APPENDECTOMY    . PACEMAKER IMPLANT  03/08/2011   [employer] [id number] [phone]) - Implanted For SSS   Patient Active Problem List   Diagnosis Date Noted  . TIA (transient ischemic attack) 08/15/2017  . Psychophysiological insomnia 05/19/2017  . Bipolar disorder, current episode mixed, moderate ([place]) 04/17/2017  . Recurrent pancreatitis 04/17/2017  . Ready to quit smoking 04/17/2017  . Atypical chest pain 02/25/2017  . Abnormal EKG 02/25/2017  . Cardiac pacemaker in situ 03/29/2016  . History of malignant neoplasm of cervix  03/29/2016  . Hypertensive disorder 03/29/2016  . Kidney stone 03/29/2016  . Multiple nodules of lung 03/29/2016  . Acute pyelonephritis 07/26/2014      Prior to Admission medications   Medication Sig Start Date End Date Taking? Authorizing Provider  apixaban (ELIQUIS) 5 MG TABS tablet Take 1 tablet (5 mg total) by mouth 2 (two) times daily. 08/31/17   Deboraha Sprang, MD  atorvastatin (LIPITOR) 40 MG tablet Take 1 tablet (40 mg total) by mouth daily. 09/11/17 12/10/17  Mikey College, NP  benzonatate (TESSALON) 100 MG capsule Take 1 capsule (100 mg total) by mouth 2 (two) times daily as needed for cough. Patient not taking: Reported on 09/11/2017 08/28/17   Mikey College, NP  buPROPion (WELLBUTRIN XL) 300 MG 24 hr tablet Take 1 tablet (300 mg total) by mouth daily. 05/15/17   Mikey College, NP  cephALEXin (KEFLEX) 500 MG capsule Take 1 capsule (500 mg total) by mouth 2 (two) times daily for 5 days. 09/11/17 09/16/17  Mikey College, NP  lisinopril (PRINIVIL,ZESTRIL) 40 MG tablet Take 1 tablet (40 mg total) by mouth daily. 04/10/17   Deboraha Sprang, MD  metoprolol succinate (TOPROL-XL) 100 MG 24 hr tablet Take 1 tablet (100 mg total) by mouth daily. 04/10/17  Deboraha Sprang, MD  Pancrelipase, Lip-Prot-Amyl, 6000 units CPEP Take 1 capsule (6,000 Units total) by mouth 3 (three) times daily with meals. Patient not taking: Reported on 09/11/2017 07/24/17   Mikey College, NP  verapamil (CALAN-SR) 240 MG CR tablet Take 1 tablet (240 mg total) by mouth at bedtime. 08/09/17   Deboraha Sprang, MD    Allergies Sulfa antibiotics; Acetaminophen; and Trazodone and nefazodone    Social History Social History   Tobacco Use  . Smoking status: Current Every Day Smoker    Packs/day: 1.00    Years: 50.00    Pack years: 50.00    Types: Cigarettes  . Smokeless tobacco: Never Used  . Tobacco comment: one pack every 3 days  Substance Use Topics  . Alcohol use: No  . Drug  use: No    Review of Systems Patient denies headaches, rhinorrhea, blurry vision, numbness, shortness of breath, chest pain, edema, cough, abdominal pain, nausea, vomiting, diarrhea, dysuria, fevers, rashes or hallucinations unless otherwise stated above in HPI. ____________________________________________   PHYSICAL EXAM:  VITAL SIGNS: Vitals:   09/11/17 1603  BP: (!) 174/86  Pulse: 65  Resp: 18  Temp: 99.3 F (37.4 C)  SpO2: 100%    Constitutional: Alert and oriented. Well appearing and in no acute distress. Eyes: Conjunctivae are normal.  Head: Atraumatic. Nose: No congestion/rhinnorhea. Mouth/Throat: Mucous membranes are moist.   Neck: No stridor. Painless ROM.  Cardiovascular: Normal rate, regular rhythm. Grossly normal heart sounds.  Good peripheral circulation. Respiratory: Normal respiratory effort.  No retractions. Lungs CTAB. Gastrointestinal: Soft and nontender. No distention. No abdominal bruits. + bilateral CVA tenderness. Genitourinary:  Musculoskeletal: No lower extremity tenderness nor edema.  No joint effusions. Neurologic:  Normal speech and language. No gross focal neurologic deficits are appreciated. No facial droop Skin:  Skin is warm, dry and intact. No rash noted. Psychiatric: Mood and affect are normal. Speech and behavior are normal.  ____________________________________________   LABS (all labs ordered are listed, but only abnormal results are displayed)  Results for orders placed or performed during the hospital encounter of 09/11/17 (from the past 24 hour(s))  Urinalysis, Complete w Microscopic     Status: Abnormal   Collection Time: 09/11/17  4:07 PM  Result Value Ref Range   Color, Urine STRAW (A) YELLOW   APPearance CLEAR (A) CLEAR   Specific Gravity, Urine 1.008 1.005 - 1.030   pH 6.0 5.0 - 8.0   Glucose, UA NEGATIVE NEGATIVE mg/dL   Hgb urine dipstick MODERATE (A) NEGATIVE   Bilirubin Urine NEGATIVE NEGATIVE   Ketones, ur NEGATIVE  NEGATIVE mg/dL   Protein, ur NEGATIVE NEGATIVE mg/dL   Nitrite NEGATIVE NEGATIVE   Leukocytes, UA TRACE (A) NEGATIVE   RBC / HPF TOO NUMEROUS TO COUNT 0 - 5 RBC/hpf   WBC, UA 0-5 0 - 5 WBC/hpf   Bacteria, UA NONE SEEN NONE SEEN   Squamous Epithelial / LPF 0-5 (A) NONE SEEN  CBC     Status: None   Collection Time: 09/11/17  4:07 PM  Result Value Ref Range   WBC 9.4 3.6 - 11.0 K/uL   RBC 5.05 3.80 - 5.20 MIL/uL   Hemoglobin 14.6 12.0 - 16.0 g/dL   HCT 44.9 35.0 - 47.0 %   MCV 89.0 80.0 - 100.0 fL   MCH 28.9 26.0 - 34.0 pg   MCHC 32.5 32.0 - 36.0 g/dL   RDW 14.1 11.5 - 14.5 %   Platelets 213 150 - 440  K/uL   ____________________________________________ ___________________________________  RADIOLOGY  I personally reviewed all radiographic images ordered to evaluate for the above acute complaints and reviewed radiology reports and findings.  These findings were personally discussed with the patient.  Please see medical record for radiology report.  ____________________________________________   PROCEDURES  Procedure(s) performed:  Procedures    Critical Care performed: no ____________________________________________   INITIAL IMPRESSION / ASSESSMENT AND PLAN / ED COURSE  Pertinent labs & imaging results that were available during my care of the patient were reviewed by me and considered in my medical decision making (see chart for details).  DDX: Stone, Pilo, UTI, cystitis, mass, colitis  Lori Orr is a 67 y.o. who presents to the ED with symptoms as described above.  Blood work is reassuring.  CT imaging shows no evidence of stones or hydro-.  No evidence of mass or obstruction.  Agree with antibiotics for UTI and probable Pilo.  Patient given IV pain medication with improvement in symptoms.  Able to ambulate with a steady gait.  Will change antibiotic to 3 times daily and discussed signs and symptoms for which she should return to the ER.     As part of my  medical decision making, I reviewed the following data within the [place] notes reviewed and incorporated, Labs reviewed, notes from prior ED visits and [place] Controlled Substance Database   ____________________________________________   FINAL CLINICAL IMPRESSION(S) / ED DIAGNOSES  Final diagnoses:  Acute bilateral low back pain without sciatica  Dysuria      NEW MEDICATIONS STARTED DURING THIS VISIT:  New Prescriptions   No medications on file     Note:  This document was prepared using Dragon voice recognition software and may include unintentional dictation errors.    Merlyn Lot, MD 09/11/17 [phone]

## 2017-09-11 NOTE — Discharge Instructions (Addendum)

## 2017-09-11 NOTE — Patient Instructions (Addendum)
[name],   Thank you for coming in to clinic today.  1. You have blood in your urine on dipstick test today.    You may have a UTI.   - START cephalexin 500 mg twice daily for 5 days.  2. For your procedure for your pancreas: - about 12 weeks on Eliquis was recommended by your Cardiologist.  Can go at 4-6 weeks after being on Eliquis, but stay in touch with your GI doc for this.  Please schedule a follow-up appointment with Cassell Smiles, AGNP. Return 5-7 days if symptoms worsen or fail to improve.  If you have any other questions or concerns, please feel free to call the clinic or send a message through [place]. You may also schedule an earlier appointment if necessary.  You will receive a survey after today's visit either digitally by e-mail or paper by [employer]. Your experiences and feedback matter to [place].  Please respond so we know how we are doing as we provide care for you.   Cassell Smiles, DNP, AGNP-BC Adult Gerontology Nurse Practitioner [place]

## 2017-09-11 NOTE — Progress Notes (Signed)
Subjective:    Patient ID: Lori Orr, female    DOB: 1950-09-18, 67 y.o.   MRN: [id number]  Lori Orr is a 67 y.o. female presenting on 09/11/2017 for Abdominal Pain (mid- lower abdominal pain x1.5 weeks. Nauseated x 1 day)   HPI Abdominal Pain Patient reports 7-10 days of lower abdominal pain with pelvic pain that is different from her usual abdominal pain with pancreatitis flares.  She is having urinary urgency, but has no burning on urination or urinary frequency.  She also endorses low back pain and general malaise.  Renal cyst Needs Kidney appointment [facility].  Patient states she has an appointment scheduled but cannot remember when it is.  ERCP schedule: Patient reports she is scheduled for an ERCP sometime soon.  She is awaiting cardiac clearance, but has not heard about status of possible schedule.  Review of chart indicates cardiology suggests waiting 12 weeks on anticoagulation with Eliquis before stopping Eliquis for the procedure.  Urgent can be done 4-6 weeks from start of Eliquis.  Social History   Tobacco Use  . Smoking status: Current Every Day Smoker    Packs/day: 1.00    Years: 50.00    Pack years: 50.00    Types: Cigarettes  . Smokeless tobacco: Never Used  . Tobacco comment: one pack every 3 days  Substance Use Topics  . Alcohol use: No  . Drug use: No    Review of Systems Per HPI unless specifically indicated above     Objective:    BP (!) 177/90 (BP Location: Right Arm, Patient Position: Sitting, Cuff Size: Normal)   Pulse (!) 59   Temp 97.8 F (36.6 C) (Oral)   Ht 5\' 2"  (1.575 m)   Wt 128 lb 12.8 oz (58.4 kg)   BMI 23.56 kg/m   Wt Readings from Last 3 Encounters:  09/11/17 128 lb 12.8 oz (58.4 kg)  08/31/17 129 lb (58.5 kg)  08/28/17 129 lb 6.4 oz (58.7 kg)    Physical Exam  Constitutional: She is oriented to person, place, and time. She appears well-developed and well-nourished. No distress.  HENT:  Head:  Normocephalic and atraumatic.  Neck: Normal range of motion. Neck supple. Carotid bruit is not present.  Cardiovascular: Normal rate, regular rhythm, S1 normal, S2 normal, normal heart sounds and intact distal pulses.  Pulmonary/Chest: Effort normal and breath sounds normal. No respiratory distress.  Abdominal: Soft. She exhibits distension (mild). There is no hepatosplenomegaly. There is generalized tenderness (increased pelvic tenderness at RLQ and LLQ.  No increased sensation to urinate with suprapubic palpation). There is CVA tenderness (mild RIGHT side).  Musculoskeletal: She exhibits no edema (pedal).  Neurological: She is alert and oriented to person, place, and time.  Mild left sided facial drooping (residual from CVA/TIA)  Skin: Skin is warm and dry.  Psychiatric: She has a normal mood and affect. Her behavior is normal.  Vitals reviewed.    Results for orders placed or performed in visit on 09/11/17  POCT Urinalysis Dipstick  Result Value Ref Range   Color, UA yellow    Clarity, UA clear    Glucose, UA neg    Bilirubin, UA neg    Ketones, UA neg    Spec Grav, UA 1.020 1.010 - 1.025   Blood, UA moderate    pH, UA 5.0 5.0 - 8.0   Protein, UA neg    Urobilinogen, UA 0.2 0.2 or 1.0 E.U./dL   Nitrite, UA neg  Leukocytes, UA Negative Negative   Appearance clear`    Odor negative       Assessment & Plan:   Problem List Items Addressed This Visit    None    Visit Diagnoses    Abdominal pain, unspecified abdominal location    -  Primary Acutely worsening abdominal pain with symptoms different from typical pancreatitis symptoms.  Associated with back pain and pelvic pain in setting of no increased or new sexual partners.  Patient also has had no change in bowel habits.     Plan: 1.  UA dipstick today: Moderate to large hematuria noted. 2. empirically treat as UTI with Keflex 500 mg twice daily times 5 days. 3.  Send for urine micro and culture to confirm UTI. 4.  If  confirmed hematuria without infection, will need referral to urology. 5.  Follow-up after results have been completed.    Relevant Medications   cephALEXin (KEFLEX) 500 MG capsule   Other Relevant Orders   POCT Urinalysis Dipstick (Completed)   Urinalysis, microscopic only   Urine Culture   Other hyperlipidemia     patient requesting refill of atorvastatin as her dose was increased and she has not gotten her 40 mg tablets.  Refill sent today.  Recheck lipid panel in about 6 months.   Relevant Medications   atorvastatin (LIPITOR) 40 MG tablet   Other microscopic hematuria       Relevant Medications   cephALEXin (KEFLEX) 500 MG capsule      Meds ordered this encounter  Medications  . atorvastatin (LIPITOR) 40 MG tablet    Sig: Take 1 tablet (40 mg total) by mouth daily.    Dispense:  90 tablet    Refill:  1    Order Specific Question:   Supervising Provider    Answer:   Olin Hauser [[id number]]  . cephALEXin (KEFLEX) 500 MG capsule    Sig: Take 1 capsule (500 mg total) by mouth 2 (two) times daily for 5 days.    Dispense:  10 capsule    Refill:  0    Order Specific Question:   Supervising Provider    Answer:   Olin Hauser [[id number]]    Follow up plan: Return 5-7 days if symptoms worsen or fail to improve.  Cassell Smiles, DNP, AGPCNP-BC Adult Gerontology Primary Care Nurse Practitioner [facility] 09/11/2017, 2:40 PM

## 2017-09-12 ENCOUNTER — Telehealth: Payer: Self-pay

## 2017-09-12 LAB — URINE CULTURE
MICRO NUMBER:: 90400522
SPECIMEN QUALITY:: ADEQUATE

## 2017-09-12 LAB — URINALYSIS, MICROSCOPIC ONLY
Bacteria, UA: NONE SEEN /HPF
Hyaline Cast: NONE SEEN /LPF
Squamous Epithelial / LPF: NONE SEEN /HPF (ref <=5)
WBC, UA: NONE SEEN /HPF (ref 0–5)

## 2017-09-12 NOTE — Telephone Encounter (Signed)
-----   Message from Mikey College, NP sent at 09/12/2017 10:53 AM EDT ----- No blood noted on microscope exam.  Continue with treatment of UTI.  Not likely going to need urology referral, but please let us know if symptoms persist.

## 2017-09-12 NOTE — Telephone Encounter (Signed)
The pt was notified, about lab results. She did inform me that she had to go to the ER for the severe abdominal & back pain.

## 2017-09-13 ENCOUNTER — Telehealth: Payer: Self-pay

## 2017-09-13 NOTE — Telephone Encounter (Signed)
   [facility] Pre-operative Risk Assessment    Request for surgical clearance:  1. What type of surgery is being performed? ERCP and EUS   2. When is this surgery scheduled? TBD   3. What type of clearance is required (medical clearance vs. Pharmacy clearance to hold med vs. Both)? Pharmacy  4. Are there any medications that need to be held prior to surgery and how long?Eliquis   5. Practice name and name of physician performing surgery? [facility]   6. What is your office phone and fax number? P: [phone] F: [phone]   7. Anesthesia type (None, local, MAC, general) ? General   Dollene Primrose 09/13/2017, 8:21 AM  _________________________________________________________________   (provider comments below)

## 2017-09-14 NOTE — Telephone Encounter (Signed)
Patient with diagnosis of atrial fibrillation on Eliquis for anticoagulation.    Procedure: ERCP and EUS Date of procedure: TBD  CHADS2-VASc score of  6 (CHF, HTN, AGE, , stroke/tia x 2, , female)  CrCl 46 Platelet count 213  Per office protocol, patient can hold Eliquis for 2 days prior to procedure.    Patient should restart Eliquis on the evening of procedure or day after, at discretion of procedure MD   From 09/05/17 request for clearance:   Dr. [name] spoke with Dr. Erasmo Downer and advised it would be ideal to wait 12 weeks prior to stopping anticoagulation, but if more urgent then to wait at least 4-6 weeks prior to stopping anticoagulation.    Patient was admitted to [facility] hospital on 08/15/17 for TIA.  It has just been 4 weeks since admit.

## 2017-09-15 NOTE — Telephone Encounter (Signed)
    See pharmacy note.  09/05/2017 Dr. [name] called and spoke with Dr. Erasmo Downer. He advised that it would be ideal to wait 12 weeks prior to stopping anticoagulation, but if he felt it was more urgent than that then to wait at least 4-6 weeks prior to stopping anticoagulation.  Therefore, the minimum timeframe for the surgery is mid May, preferably late June.  At that time, hold Eliquis for 2 days prior to the procedure and restart the evening of the procedure or the day after at discretion of the procedure MD.  Rosaria Ferries, PA-C 09/15/2017 3:44 PM Beeper [phone]

## 2017-09-18 ENCOUNTER — Telehealth: Payer: Self-pay | Admitting: Internal Medicine

## 2017-09-18 ENCOUNTER — Telehealth: Payer: Self-pay | Admitting: Nurse Practitioner

## 2017-09-18 NOTE — Telephone Encounter (Signed)
Pt has not been feeling good for 2 weeks not eating, dizziness and fatigue sob .  She asked if it could be the medication she is taking. Please call to discuss

## 2017-09-18 NOTE — Telephone Encounter (Signed)
I called and spoke with the patient. She states for the past 2 weeks she has not been able to eat/ sleep well. She is having some dizziness and weakness also.  She is currently out of state in [place] with her fiance who is not doing well health wise.  She is not sure how long she will be out of town- this could be for an extended period- she states she will have to be back by June as she has some testing that has to be done on her pancreas and the fact that she has multiple cyst to her right & left kidney.   She recently started eliquis- I advised her that she may need to be tested to see if she is at all anemic. She states she saw her PCP just prior to leaving town and she is now positive for a UTI- she states her PCP called in a RX for an antibiotic today to the pharmacy in [place] and she started this today.   She is currently out of lipitor x 2 days. I advised her that her symptoms may be from her UTI- she is aware to finish her course of antibiotics and then we can re-evaluate. I have advised her to stay off lipitor for few days as well as she reports some discomfort in her lower abdomen. She is aware to continue her eliquis, but if symptoms worsen acutely, to proceed to the nearest ER / Urgent Care for further evaluation.  She is aware it will be harder for [place] to treat her when she is out of state. She voices understanding.

## 2017-09-18 NOTE — Telephone Encounter (Signed)
Pt has not been feeling good lately, not eating, dizziness and fatigue.  She asked if it could be the medication she is taking.  She asked for a call back [phone]

## 2017-09-19 ENCOUNTER — Encounter: Payer: Medicare Other | Admitting: Physician Assistant

## 2017-09-19 NOTE — Telephone Encounter (Addendum)
I spoke w/ pt and she informed me that she just picked up her prescription for abx for the UTI and that she spoke w/ another physician who informed her that a UTI can make you feel awful. They recommended her to start the abx and f/u with [place] if her symptoms doesn't improve. I informed the pt to start the medication and if her symptoms doesn't improve or gets worse that she needs to be evaluated. She verbalize understanding, no questions or concerns.

## 2017-10-04 ENCOUNTER — Telehealth: Payer: Self-pay | Admitting: Internal Medicine

## 2017-10-04 NOTE — Telephone Encounter (Signed)
The patient called the office today stating that her abdominal pain is worse than it has been and she feels like she will need to have her GI issues addressed very soon.  Eliquis was started on 08/31/17 for a-fib/ CVA. Dr. [name] spoke with Dr. Erasmo Downer on 09/05/17 in regards to her blood thinner.   Per 09/05/17 phone message: " Dr. [name] called and spoke with Dr. Erasmo Downer. He advised that it would be ideal to wait 12 weeks prior to stopping anticoagulation, but if he felt it was more urgent than that then to wait at least 4-6 weeks prior to stopping anticoagulation   The patient is still in [place] at this time with her fiance that was having health issues. She states she will be coming back on 10/13/17. I advised I will review with Dr. [name] regarding her blood thinner, but she may also want to touch base with her surgeon in regards to how she is feeling.

## 2017-10-05 NOTE — Telephone Encounter (Signed)
New message  Pt verbalzied that she is calling for RN  About surgical clearance and holding blood thinner for Dr.Barron    Please fax attention to JILL/KAREN [phone]

## 2017-10-05 NOTE — Telephone Encounter (Signed)
Covering pre-op APP pool today. It appears this issue is being addressed outside of pre-op scope as there have been direct conversations between Dr. [name], nurse Nira Conn, and Dr. Erasmo Downer. Patient calling back for the nurse - will route back to [facility] and remove from APP pool. Arlita Buffkin PA-C

## 2017-10-05 NOTE — Telephone Encounter (Signed)
Reviewed with Dr. [name]- ok for the patient to proceed with her GI surgery if needed. She may hold Eliquis for 48 hours prior and resume when the performing surgeon feels is ok for her to do so.  Will fax a statement of clearance to [facility].  I did leave a message for Sharee Pimple to confirm what the patient is having done and which physician is performing prior to sending.

## 2017-10-06 ENCOUNTER — Encounter: Payer: Self-pay | Admitting: *Deleted

## 2017-10-06 NOTE — Telephone Encounter (Signed)
lookihng at the guidelines for low risk bleeding procedure is to hold 3 doses including the morning of the procedure

## 2017-10-06 NOTE — Telephone Encounter (Signed)
Pt returning our call  ° °

## 2017-10-06 NOTE — Telephone Encounter (Signed)
Faxed to [phone]. Confirmation received

## 2017-10-06 NOTE — Telephone Encounter (Signed)
I spoke with Lori Orr at [place]. She states that Dr. Gayleen Orem is performing the patient's procedure. She is scheduled for a ERCP & Upper Endoscopy ultrasound.  I advised I will fax her clearance over today.    Reviewed patient's chart further- patient with a history of TIA/ CVA. She has a PPM.  Will review with Dr. [name] one more time to see if the patient is still ok to proceed with coming off Eliquis 48 hours prior to procedure with no bridging.

## 2017-10-06 NOTE — Telephone Encounter (Signed)
I left a message for Sharee Pimple to call back.

## 2017-10-09 ENCOUNTER — Encounter: Payer: Self-pay | Admitting: *Deleted

## 2017-10-09 NOTE — Telephone Encounter (Signed)
Letter done today and faxed to Dr. Lysle Rubens Jill/ Santiago Glad at [facility] stating the patient is ok to proceed with her procedure and that she may hold eliquis x 3 doses prior to procedure and resume when Dr. Lysle Rubens feels is safe for her to do so.  Faxed to [phone]. Confirmation received.

## 2017-10-17 ENCOUNTER — Other Ambulatory Visit: Payer: Self-pay | Admitting: Internal Medicine

## 2017-10-18 ENCOUNTER — Other Ambulatory Visit: Payer: Self-pay

## 2017-10-18 ENCOUNTER — Encounter: Payer: Self-pay | Admitting: Nurse Practitioner

## 2017-10-18 ENCOUNTER — Ambulatory Visit (INDEPENDENT_AMBULATORY_CARE_PROVIDER_SITE_OTHER): Payer: Medicare Other | Admitting: Nurse Practitioner

## 2017-10-18 VITALS — BP 147/81 | HR 59 | Temp 98.4°F | Ht 62.0 in | Wt 121.4 lb

## 2017-10-18 DIAGNOSIS — K861 Other chronic pancreatitis: Secondary | ICD-10-CM | POA: Diagnosis not present

## 2017-10-18 DIAGNOSIS — R413 Other amnesia: Secondary | ICD-10-CM

## 2017-10-18 DIAGNOSIS — Z8673 Personal history of transient ischemic attack (TIA), and cerebral infarction without residual deficits: Secondary | ICD-10-CM | POA: Diagnosis not present

## 2017-10-18 MED ORDER — TRAMADOL HCL 50 MG PO TABS: 50.0000 mg | ORAL_TABLET | Freq: Four times a day (QID) | ORAL | 0 refills | Status: DC | PRN

## 2017-10-18 NOTE — Progress Notes (Signed)
Subjective:    Patient ID: Lori Orr, female    DOB: 11-May-1951, 67 y.o.   MRN: [id number]  Lori Orr is a 67 y.o. female presenting on 10/18/2017 for Memory Loss (x 1 week ago.pt state she had a episode where she lose memory for several hours w/ history of strokes . ) and Pancreatitis (chronic pain, and fatigue )   HPI Amnesic event Approximately 1 week ago, pt sat on couch to rest.  She had her chin resting in hand with elbow on arm of the couch.  She then loses all ability to remember the next events which were shared with her by her boyfriend who was present: - She proceeded to go to kitchen and prepare a sandwich, but she put pikcle relish, mayo, lemon juice on sandwich.  Normally, pt uses just mayo and sandwich meat on sandwiches.  Pt states she doesn't like pickle relish and never would have put lemon juice on her sandwich. - She reports her boyfriend helped assist her to bed and woke at 9 am next morning.  After that, pt now has memories of events that occurred. - No additional memory loss, aphasia, and lack of recall of events since that time. - Pt reports no additional changes in speech, weakness that are new.  She also has not asked her boyfriend about any additional details.  - Pt was admitted to hospital at [facility] 08/2017 for presumed CVA.  No long-term deficits were noted on last exam.  CT scan 3/5 and 08/16/2017 noted no acute infarction or aneurysm were noted.  Pt also has history of atrial fibrillation and is now started on Eliquis for CVA prevention.   -  Has contacted cardiology today with instructions to come to PCP for additional evaluation of amnesia.   - Pt has no history of seizure disorder.  Chronic Pancreatitis Scheduled at [facility] 12/01/2017 for ERCP for "stone removal."  Procedure delayed 2/2 time needed for anticoagulation with Eliquis per Cardiology.  Pt is continuing to have regular and intense pain.  She has had substantial relief in past with Tramadol and requests  refill to help with pain until this date.  - [place] PMP Aware reviewed and is consistent with multiple intermittent prescriptions for Tramadol only and during higher periods of reported pain requiring treatment from ED/inpatient admission.  Social History   Tobacco Use  . Smoking status: Current Every Day Smoker    Packs/day: 1.00    Years: 50.00    Pack years: 50.00    Types: Cigarettes  . Smokeless tobacco: Never Used  . Tobacco comment: one pack every 3 days  Substance Use Topics  . Alcohol use: No  . Drug use: No    Review of Systems Per HPI unless specifically indicated above     Objective:    BP (!) 147/81 (BP Location: Right Arm, Patient Position: Sitting, Cuff Size: Normal)   Pulse (!) 59   Temp 98.4 F (36.9 C) (Oral)   Ht 5\' 2"  (1.575 m)   Wt 121 lb 6.4 oz (55.1 kg)   BMI 22.20 kg/m   Wt Readings from Last 3 Encounters:  10/18/17 121 lb 6.4 oz (55.1 kg)  09/11/17 128 lb (58.1 kg)  09/11/17 128 lb 12.8 oz (58.4 kg)    Physical Exam  Constitutional: She is oriented to person, place, and time. She appears well-developed and well-nourished. No distress.  HENT:  Head: Normocephalic and atraumatic.  Eyes: Pupils are equal, round, and reactive to light. EOM  are normal.  Neck: Normal range of motion. Neck supple. Carotid bruit is not present.  Cardiovascular: Normal rate, regular rhythm, S1 normal, S2 normal, normal heart sounds and intact distal pulses.  Pulmonary/Chest: Effort normal and breath sounds normal. No respiratory distress.  Musculoskeletal: She exhibits no edema (pedal).  Neurological: She is alert and oriented to person, place, and time. She has normal strength. No cranial nerve deficit or sensory deficit. She displays a negative Romberg sign. GCS eye subscore is 4. GCS verbal subscore is 5. GCS motor subscore is 6.  Mild drooping of left lip at rest, but normal and symmetric with active smile. Pt with difficulty balancing with heel to toe walk.   Otherwise, gait normal.  Skin: Skin is warm and dry. Capillary refill takes less than 2 seconds.  Psychiatric: She has a normal mood and affect. Her behavior is normal. Judgment and thought content normal.  Vitals reviewed.   Results for orders placed or performed during the hospital encounter of 09/11/17  Urinalysis, Complete w Microscopic  Result Value Ref Range   Color, Urine STRAW (A) YELLOW   APPearance CLEAR (A) CLEAR   Specific Gravity, Urine 1.008 1.005 - 1.030   pH 6.0 5.0 - 8.0   Glucose, UA NEGATIVE NEGATIVE mg/dL   Hgb urine dipstick MODERATE (A) NEGATIVE   Bilirubin Urine NEGATIVE NEGATIVE   Ketones, ur NEGATIVE NEGATIVE mg/dL   Protein, ur NEGATIVE NEGATIVE mg/dL   Nitrite NEGATIVE NEGATIVE   Leukocytes, UA TRACE (A) NEGATIVE   RBC / HPF TOO NUMEROUS TO COUNT 0 - 5 RBC/hpf   WBC, UA 0-5 0 - 5 WBC/hpf   Bacteria, UA NONE SEEN NONE SEEN   Squamous Epithelial / LPF 0-5 (A) NONE SEEN  CBC  Result Value Ref Range   WBC 9.4 3.6 - 11.0 K/uL   RBC 5.05 3.80 - 5.20 MIL/uL   Hemoglobin 14.6 12.0 - 16.0 g/dL   HCT 44.9 35.0 - 47.0 %   MCV 89.0 80.0 - 100.0 fL   MCH 28.9 26.0 - 34.0 pg   MCHC 32.5 32.0 - 36.0 g/dL   RDW 14.1 11.5 - 14.5 %   Platelets 213 150 - 440 K/uL  Comprehensive metabolic panel  Result Value Ref Range   Sodium 140 135 - 145 mmol/L   Potassium 4.4 3.5 - 5.1 mmol/L   Chloride 106 101 - 111 mmol/L   CO2 26 22 - 32 mmol/L   Glucose, Bld 104 (H) 65 - 99 mg/dL   BUN 17 6 - 20 mg/dL   Creatinine, Ser 1.08 (H) 0.44 - 1.00 mg/dL   Calcium 10.1 8.9 - 10.3 mg/dL   Total Protein 7.7 6.5 - 8.1 g/dL   Albumin 4.2 3.5 - 5.0 g/dL   AST 28 15 - 41 U/L   ALT 26 14 - 54 U/L   Alkaline Phosphatase 77 38 - 126 U/L   Total Bilirubin 0.7 0.3 - 1.2 mg/dL   GFR calc non Af Amer 52 (L) >60 mL/min   GFR calc Af Amer 60 (L) >60 mL/min   Anion gap 8 5 - 15  Lipase, blood  Result Value Ref Range   Lipase 41 11 - 51 U/L      Assessment & Plan:   Problem List Items  Addressed This Visit    None    Visit Diagnoses    Other chronic pancreatitis (HCC)    -  Primary   Relevant Medications   traMADol (ULTRAM) 50 MG tablet  Other amnesia       Relevant Orders   Ambulatory referral to Neurology   History of CVA (cerebrovascular accident)       Relevant Orders   Ambulatory referral to Neurology    #1: Amnesia: Acute amnesic event 1 week ago that is transient.  Pt has had no recurrences.  Symptoms most consistent with seizure activity vs CVA vs TIA.  No current symptoms are being experienced except continue amnesia of that event between lunch and 9 am the following day.  Given history of CVA symptoms, cannot exclude CVA/TIA.  However, symptoms seem more concurrent with possible seizures.  No outpatient neurology exam to date since CVA/TIA.  Plan: 1. Referral Neurology for additional evaluation.  Considered additional imaging, but will defer to neuro as pt is unable to have MRI with pacemaker. 2. Continue Eliquis as prescribed -  5 mg bid. 3. Reviewed s/sx of seizures and stroke.  Instructed pt to seek care via 9-1-1  4. Followup within 2 months.  Sooner, if needed.  Pt verbalizes understanding to call if needed.  #2: Chronic Pancreatitis: Chronic problem requiring ERCP, but delay of procedure 2/2 anticoagulation to 12/04/2017.  Pt continues to experience moderate to severe abdominal pain.  Continue with additional 5 day prescription of tramadol 50 mg.  Take 1 tab po q6hr prn severe pain.  Meds ordered this encounter  Medications  . DISCONTD: traMADol (ULTRAM) 50 MG tablet    Sig: Take 1 tablet (50 mg total) by mouth every 6 (six) hours as needed for severe pain.    Dispense:  20 tablet    Refill:  0    Order Specific Question:   Supervising Provider    Answer:   Olin Hauser [[id number]]  . traMADol (ULTRAM) 50 MG tablet    Sig: Take 1 tablet (50 mg total) by mouth every 6 (six) hours as needed for severe pain.    Dispense:  20 tablet    Refill:   0    Order Specific Question:   Supervising Provider    Answer:   Olin Hauser [[id number]]    Follow up plan: Return in about 2 months (around 12/18/2017) for Memory Loss (stroke vs seizure).  Cassell Smiles, DNP, AGPCNP-BC Adult Gerontology Primary Care Nurse Practitioner [facility] 10/18/2017, 2:56 PM

## 2017-10-18 NOTE — Patient Instructions (Addendum)
Lori Orr,   Thank you for coming in to clinic today.  1. You have had amnesia (memory loss and no creation of original memory). With the unusual activity, this could have been another stroke or it could be seizure activity.  Seizure activity or stroke can be followed by period of sleep. - Neurology will call you for an appointment.  2. Resume tramadol 50 mg every 6 hours as needed for abdominal pain.  Please schedule a follow-up appointment with Cassell Smiles, AGNP. Return in about 2 months (around 12/18/2017) for Memory Loss (stroke vs seizure).  If you have any other questions or concerns, please feel free to call the clinic or send a message through [place]. You may also schedule an earlier appointment if necessary.  You will receive a survey after today's visit either digitally by e-mail or paper by [employer]. Your experiences and feedback matter to [place].  Please respond so we know how we are doing as we provide care for you.   Cassell Smiles, DNP, AGNP-BC Adult Gerontology Nurse Practitioner [facility], [facility]    Seizure, Adult A seizure is a sudden burst of abnormal electrical activity in the brain. The abnormal activity temporarily interrupts normal brain function, causing a person to experience any of the following:  Involuntary movements.  Changes in awareness or consciousness.  Uncontrollable shaking (convulsions).  Seizures usually last from 30 seconds to 2 minutes. They usually do not cause permanent brain damage unless they are prolonged. What can cause a seizure to happen? Seizures can happen for many reasons including:  A fever.  Low blood sugar.  A medicine.  An illnesses.  A brain injury.  Some people who have a seizure never have another one. People who have repeated seizures have a condition called epilepsy. What are the symptoms of a seizure? Symptoms of a seizure vary greatly from person to person. They  include:  Convulsions.  Stiffening of the body.  Involuntary movements of the arms or legs.  Loss of consciousness.  Breathing problems.  Falling suddenly.  Confusion.  Head nodding.  Eye blinking or fluttering.  Lip smacking.  Drooling.  Rapid eye movements.  Grunting.  Loss of bladder control and bowel control.  Staring.  Unresponsiveness.  Some people have symptoms right before a seizure happens (aura) and right after a seizure happens. Symptoms of an aura include:  Fear or anxiety.  Nausea.  Feeling like the room is spinning (vertigo).  A feeling of having seen or heard something before (deja vu).  Odd tastes or smells.  Changes in vision, such as seeing flashing lights or spots.  Symptoms that may follow a seizure include:  Confusion.  Sleepiness.  Headache.  Weakness of one side of the body.  Follow these instructions at home: Medicines   Take over-the-counter and prescription medicines only as told by your health care provider.  Avoid any substances that may prevent your medicine from working properly, such as alcohol. Activity  Do not drive, swim, or do any other activities that would be dangerous if you had another seizure. Wait until your health care provider approves.  If you live in the [place] check with your local DMV (department of motor vehicles) to find out about the local driving laws. Each state has specific rules about when you can legally return to driving.  Get enough rest. Lack of sleep can make seizures more likely to occur. Educating others Teach friends and family what to do if you have a  seizure. They should:  Lay you on the ground to prevent a fall.  Cushion your head and body.  Loosen any tight clothing around your neck.  Turn you on your side. If vomiting occurs, this helps keep your airway clear.  Stay with you until you recover.  Not hold you down. Holding you down will not stop the seizure.  Not put  anything in your mouth.  Know whether or not you need emergency care.  General instructions  Contact your health care provider each time you have a seizure.  Avoid anything that has ever triggered a seizure for you.  Keep a seizure diary. Record what you remember about each seizure, especially anything that might have triggered the seizure.  Keep all follow-up visits as told by your health care provider. This is important. Contact a health care provider if:  You have another seizure.  You have seizures more often.  Your seizure symptoms change.  You continue to have seizures with treatment.  You have symptoms of an infection or illness. They might increase your risk of having a seizure. Get help right away if:  You have a seizure: ? That lasts longer than 5 minutes. ? That is different than previous seizures. ? That leaves you unable to speak or use a part of your body. ? That makes it harder to breathe. ? After a head injury.  You have: ? Multiple seizures in a row. ? Confusion or a severe headache right after a seizure.  You are having seizures more often.  You do not wake up immediately after a seizure.  You injure yourself during a seizure. These symptoms may represent a serious problem that is an emergency. Do not wait to see if the symptoms will go away. Get medical help right away. Call your local emergency services (911 in the [place] Do not drive yourself to the hospital. This information is not intended to replace advice given to you by your health care provider. Make sure you discuss any questions you have with your health care provider. Document Released: 05/27/2000 Document Revised: 01/24/2016 Document Reviewed: 01/01/2016 Elsevier Interactive Patient Education  [employer].

## 2017-10-18 NOTE — Telephone Encounter (Signed)
Eliquis 5mg  refill request received; pt is 67 yrs old, Wt-58.4kg, Crea-1.08 on 09/11/17, last seen by Dr. [name] on 08/31/17; will send to requested pharmacy.

## 2017-10-26 DIAGNOSIS — R1012 Left upper quadrant pain: Secondary | ICD-10-CM | POA: Diagnosis not present

## 2017-10-26 DIAGNOSIS — Z79899 Other long term (current) drug therapy: Secondary | ICD-10-CM | POA: Diagnosis not present

## 2017-10-26 DIAGNOSIS — Z882 Allergy status to sulfonamides status: Secondary | ICD-10-CM | POA: Diagnosis not present

## 2017-10-26 DIAGNOSIS — F17210 Nicotine dependence, cigarettes, uncomplicated: Secondary | ICD-10-CM | POA: Diagnosis not present

## 2017-10-26 DIAGNOSIS — Z7901 Long term (current) use of anticoagulants: Secondary | ICD-10-CM | POA: Diagnosis not present

## 2017-10-26 DIAGNOSIS — R112 Nausea with vomiting, unspecified: Secondary | ICD-10-CM | POA: Diagnosis not present

## 2017-10-26 DIAGNOSIS — I130 Hypertensive heart and chronic kidney disease with heart failure and stage 1 through stage 4 chronic kidney disease, or unspecified chronic kidney disease: Secondary | ICD-10-CM | POA: Diagnosis not present

## 2017-10-26 DIAGNOSIS — N189 Chronic kidney disease, unspecified: Secondary | ICD-10-CM | POA: Diagnosis not present

## 2017-10-26 DIAGNOSIS — K861 Other chronic pancreatitis: Secondary | ICD-10-CM | POA: Diagnosis not present

## 2017-10-26 DIAGNOSIS — R079 Chest pain, unspecified: Secondary | ICD-10-CM | POA: Diagnosis not present

## 2017-10-26 DIAGNOSIS — I509 Heart failure, unspecified: Secondary | ICD-10-CM | POA: Diagnosis not present

## 2017-10-26 DIAGNOSIS — Z8673 Personal history of transient ischemic attack (TIA), and cerebral infarction without residual deficits: Secondary | ICD-10-CM | POA: Diagnosis not present

## 2017-10-26 DIAGNOSIS — Z950 Presence of cardiac pacemaker: Secondary | ICD-10-CM | POA: Diagnosis not present

## 2017-10-26 DIAGNOSIS — Z886 Allergy status to analgesic agent status: Secondary | ICD-10-CM | POA: Diagnosis not present

## 2017-11-01 ENCOUNTER — Emergency Department: Payer: Medicare Other

## 2017-11-01 ENCOUNTER — Other Ambulatory Visit: Payer: Self-pay

## 2017-11-01 ENCOUNTER — Emergency Department
Admission: EM | Admit: 2017-11-01 | Discharge: 2017-11-01 | Disposition: A | Discharge status: Home / Self Care | Payer: Medicare Other | Attending: Emergency Medicine | Admitting: Emergency Medicine

## 2017-11-01 DIAGNOSIS — Z950 Presence of cardiac pacemaker: Secondary | ICD-10-CM | POA: Insufficient documentation

## 2017-11-01 DIAGNOSIS — I130 Hypertensive heart and chronic kidney disease with heart failure and stage 1 through stage 4 chronic kidney disease, or unspecified chronic kidney disease: Secondary | ICD-10-CM | POA: Insufficient documentation

## 2017-11-01 DIAGNOSIS — R531 Weakness: Secondary | ICD-10-CM | POA: Diagnosis not present

## 2017-11-01 DIAGNOSIS — K603 Anal fistula: Secondary | ICD-10-CM | POA: Insufficient documentation

## 2017-11-01 DIAGNOSIS — F17210 Nicotine dependence, cigarettes, uncomplicated: Secondary | ICD-10-CM | POA: Diagnosis not present

## 2017-11-01 DIAGNOSIS — N189 Chronic kidney disease, unspecified: Secondary | ICD-10-CM | POA: Diagnosis not present

## 2017-11-01 DIAGNOSIS — L89323 Pressure ulcer of left buttock, stage 3: Secondary | ICD-10-CM | POA: Diagnosis not present

## 2017-11-01 DIAGNOSIS — R222 Localized swelling, mass and lump, trunk: Secondary | ICD-10-CM | POA: Diagnosis present

## 2017-11-01 DIAGNOSIS — L0231 Cutaneous abscess of buttock: Secondary | ICD-10-CM | POA: Insufficient documentation

## 2017-11-01 DIAGNOSIS — I509 Heart failure, unspecified: Secondary | ICD-10-CM | POA: Diagnosis not present

## 2017-11-01 DIAGNOSIS — R590 Localized enlarged lymph nodes: Secondary | ICD-10-CM | POA: Diagnosis not present

## 2017-11-01 LAB — URINALYSIS, COMPLETE (UACMP) WITH MICROSCOPIC
BACTERIA UA: NONE SEEN
Bilirubin Urine: NEGATIVE
GLUCOSE, UA: NEGATIVE mg/dL
Ketones, ur: 5 mg/dL — AB
LEUKOCYTES UA: NEGATIVE
NITRITE: NEGATIVE
PH: 6 (ref 5.0–8.0)
Protein, ur: NEGATIVE mg/dL
SPECIFIC GRAVITY, URINE: 1.017 (ref 1.005–1.030)

## 2017-11-01 LAB — COMPREHENSIVE METABOLIC PANEL
ALBUMIN: 3.3 g/dL — AB (ref 3.5–5.0)
ALK PHOS: 62 U/L (ref 38–126)
ALT: 13 U/L — AB (ref 14–54)
ANION GAP: 6 (ref 5–15)
AST: 23 U/L (ref 15–41)
BUN: 13 mg/dL (ref 6–20)
CO2: 25 mmol/L (ref 22–32)
Calcium: 8.6 mg/dL — ABNORMAL LOW (ref 8.9–10.3)
Chloride: 105 mmol/L (ref 101–111)
Creatinine, Ser: 0.8 mg/dL (ref 0.44–1.00)
GFR calc Af Amer: >60 mL/min (ref >60)
GFR calc non Af Amer: >60 mL/min (ref >60)
GLUCOSE: 115 mg/dL — AB (ref 65–99)
Potassium: 3.8 mmol/L (ref 3.5–5.1)
SODIUM: 136 mmol/L (ref 135–145)
Total Bilirubin: 1.1 mg/dL (ref 0.3–1.2)
Total Protein: 6.6 g/dL (ref 6.5–8.1)

## 2017-11-01 LAB — CBC
HEMATOCRIT: 37.3 % (ref 35.0–47.0)
Hemoglobin: 12.6 g/dL (ref 12.0–16.0)
MCH: 30.4 pg (ref 26.0–34.0)
MCHC: 33.9 g/dL (ref 32.0–36.0)
MCV: 89.8 fL (ref 80.0–100.0)
PLATELETS: 164 10*3/uL (ref 150–440)
RBC: 4.16 MIL/uL (ref 3.80–5.20)
RDW: 13.7 % (ref 11.5–14.5)
WBC: 12.2 10*3/uL — AB (ref 3.6–11.0)

## 2017-11-01 MED ORDER — HYDROMORPHONE HCL 1 MG/ML IJ SOLN
1.0000 mg | Freq: Once | INTRAMUSCULAR | Status: AC
  Administered 2017-11-01: 1 mg via INTRAVENOUS
  Filled 2017-11-01: qty 1

## 2017-11-01 MED ORDER — METRONIDAZOLE 500 MG PO TABS: 500.0000 mg | ORAL_TABLET | Freq: Three times a day (TID) | ORAL | 0 refills | Status: AC

## 2017-11-01 MED ORDER — FENTANYL CITRATE (PF) 100 MCG/2ML IJ SOLN
50.0000 ug | Freq: Once | INTRAMUSCULAR | Status: AC
  Administered 2017-11-01: 50 ug via INTRAVENOUS

## 2017-11-01 MED ORDER — IOPAMIDOL (ISOVUE-300) INJECTION 61%
100.0000 mL | Freq: Once | INTRAVENOUS | Status: AC | PRN
  Administered 2017-11-01: 80 mL via INTRAVENOUS

## 2017-11-01 MED ORDER — ONDANSETRON HCL 4 MG/2ML IJ SOLN
4.0000 mg | Freq: Once | INTRAMUSCULAR | Status: AC
  Administered 2017-11-01: 4 mg via INTRAVENOUS
  Filled 2017-11-01: qty 2

## 2017-11-01 MED ORDER — LIDOCAINE HCL (PF) 1 % IJ SOLN
10.0000 mL | Freq: Once | INTRAMUSCULAR | Status: AC
  Administered 2017-11-01: 10 mL via INTRADERMAL
  Filled 2017-11-01: qty 10

## 2017-11-01 MED ORDER — TRAMADOL HCL 50 MG PO TABS: 50.0000 mg | ORAL_TABLET | Freq: Four times a day (QID) | ORAL | 0 refills | Status: DC | PRN

## 2017-11-01 MED ORDER — VANCOMYCIN HCL IN DEXTROSE 1-5 GM/200ML-% IV SOLN
1000.0000 mg | Freq: Once | INTRAVENOUS | Status: AC
  Administered 2017-11-01: 1000 mg via INTRAVENOUS
  Filled 2017-11-01: qty 200

## 2017-11-01 MED ORDER — FENTANYL CITRATE (PF) 100 MCG/2ML IJ SOLN
INTRAMUSCULAR | Status: AC
  Filled 2017-11-01: qty 2

## 2017-11-01 MED ORDER — CIPROFLOXACIN HCL 500 MG PO TABS: 500.0000 mg | ORAL_TABLET | Freq: Two times a day (BID) | ORAL | 0 refills | Status: AC

## 2017-11-01 MED ORDER — SODIUM CHLORIDE 0.9 % IV BOLUS
1000.0000 mL | Freq: Once | INTRAVENOUS | Status: AC
  Administered 2017-11-01: 1000 mL via INTRAVENOUS

## 2017-11-01 NOTE — Discharge Instructions (Addendum)
Please seek medical attention for any high fevers, chest pain, shortness of breath, change in behavior, persistent vomiting, bloody stool or any other new or concerning symptoms.  

## 2017-11-01 NOTE — ED Triage Notes (Signed)
Pt c/o pain to buttocks since Sunday.

## 2017-11-01 NOTE — Consult Note (Addendum)
SURGICAL CONSULTATION NOTE (initial) - cpt: [id number]  HISTORY OF PRESENT ILLNESS (HPI):  67 y.o. female presented to [facility] ED today for evaluation of Left buttock pain with redness and swelling x 4 days and says it has hurt to sit on her buttock and has made sleep difficult accordingly. She continues to pass +flatus and +BM's without blood or constipation. Patient reports she previously experienced similar 6 years ago while living in [place], at which time she required incision and drainage for an abscess. Patient since moved to [place] to live with her daughter, who is a hospital medicine RN at [employer]. Patient reports nausea and intermittent subjective fever, though has been afebrile in the ED, denies CP or SOB. Of note, patient has not taken her Eliquis today or last night.  Surgery is consulted by ED physician Dr. Mariea Clonts in this context for evaluation and management of Left buttock/gluteal fold abscess.  PAST MEDICAL HISTORY (PMH):  Past Medical History:  Diagnosis Date  . Bradycardia   . Cancer ([place])    ovarian cancer  oophrectomy w/ only "1/4 ovary remaining"  . CHF (congestive heart failure) ([place])   . Chronic kidney disease   . Depression   . Hypertension   . Pancreatitis   . SSS (sick sinus syndrome) ([place])    PPM implant 03/08/11 - [employer] [id number] - SN [id number]     PAST SURGICAL HISTORY ([place]):  Past Surgical History:  Procedure Laterality Date  . ABDOMINAL HYSTERECTOMY    . APPENDECTOMY    . PACEMAKER IMPLANT  03/08/2011   [employer] [id number] [phone]) - Implanted For SSS     MEDICATIONS:  Prior to Admission medications   Medication Sig Start Date End Date Taking? Authorizing Provider  atorvastatin (LIPITOR) 40 MG tablet Take 1 tablet (40 mg total) by mouth daily. 09/11/17 12/10/17 Yes Mikey College, NP  buPROPion (WELLBUTRIN XL) 300 MG 24 hr tablet Take 1 tablet (300 mg total) by mouth daily. 05/15/17  Yes Mikey College, NP  ELIQUIS 5 MG TABS tablet  TAKE 1 TABLET BY MOUTH TWICE DAILY 10/18/17  Yes Deboraha Sprang, MD  lisinopril (PRINIVIL,ZESTRIL) 40 MG tablet Take 1 tablet (40 mg total) by mouth daily. 04/10/17  Yes Deboraha Sprang, MD  metoprolol succinate (TOPROL-XL) 100 MG 24 hr tablet Take 1 tablet (100 mg total) by mouth daily. 04/10/17  Yes Deboraha Sprang, MD  verapamil (CALAN-SR) 240 MG CR tablet Take 1 tablet (240 mg total) by mouth at bedtime. 08/09/17  Yes Deboraha Sprang, MD  traMADol (ULTRAM) 50 MG tablet Take 1 tablet (50 mg total) by mouth every 6 (six) hours as needed for severe pain. Patient not taking: Reported on 11/01/2017 10/18/17 10/18/18  Mikey College, NP     ALLERGIES:  Allergies  Allergen Reactions  . Sulfa Antibiotics Rash and Hives  . Acetaminophen   . Trazodone And Nefazodone Other (See Comments)     Severe nightmares     SOCIAL HISTORY:  Social History   Socioeconomic History  . Marital status: Divorced    Spouse name: Not on file  . Number of children: 1  . Years of education: Not on file  . Highest education level: Not on file  Occupational History  . 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  Social Needs  . Financial resource strain: Not hard at all  . Food insecurity:    Worry: Never true    Inability: Never true  .  Transportation needs:    Medical: Yes    Non-medical: No  Tobacco Use  . Smoking status: Current Every Day Smoker    Packs/day: 1.00    Years: 50.00    Pack years: 50.00    Types: Cigarettes  . Smokeless tobacco: Never Used  . Tobacco comment: one pack every 3 days  Substance and Sexual Activity  . Alcohol use: No  . Drug use: No  . Sexual activity: Not Currently  Lifestyle  . Physical activity:    Days per week: 0 days    Minutes per session: 0 min  . Stress: Not at all  Relationships  . Social connections:    Talks on phone: Never    Gets together: Never    Attends religious service: Never    Active member of club or  organization: No    Attends meetings of clubs or organizations: Never    Relationship status: Divorced  . Intimate partner violence:    Fear of current or ex partner: No    Emotionally abused: No    Physically abused: No    Forced sexual activity: No  Other Topics Concern  . Not on file  Social History Narrative   Moving in with her daughter.     The patient currently resides (home / rehab facility / nursing home): Home The patient normally is (ambulatory / bedbound): Ambulatory   FAMILY HISTORY:  Family History  Problem Relation Age of Onset  . Dementia Mother   . Heart attack Father        Died age 56  . Heart disease Sister        Congential  . Heart disease Sister        "Some kind of heart surgery."  . Fibromyalgia Daughter      REVIEW OF SYSTEMS:  Constitutional: denies weight loss, fever, chills, or sweats  Eyes: denies any other vision changes, history of eye injury  ENT: denies sore throat, hearing problems  Respiratory: denies shortness of breath, wheezing  Cardiovascular: denies chest pain, palpitations  Gastrointestinal: denies abdominal pain, N/V, or diarrhea Genitourinary: denies burning with urination or urinary frequency Musculoskeletal: denies any other joint pains or cramps  Skin: denies any other rashes or skin discolorations except as per HPI Neurological: denies any other headache, dizziness, weakness  Psychiatric: denies any other depression, anxiety   All other review of systems were negative   VITAL SIGNS:  Temp:  [98.4 F (36.9 C)] 98.4 F (36.9 C) (05/22 0911) Pulse Rate:  [61-74] 61 (05/22 1410) Resp:  [18] 18 (05/22 0911) BP: (123-145)/(64-127) 129/93 (05/22 1315) SpO2:  [86 %-100 %] 90 % (05/22 1410) Weight:  [121 lb (54.9 kg)] 121 lb (54.9 kg) (05/22 0908)       Weight: 121 lb (54.9 kg) BMI (Calculated): 22.13   INTAKE/OUTPUT:  This shift: Total I/O In: 1889.2 [IV Piggyback:1889.2] Out: -   Last 2 shifts: @IOLAST2SHIFTS @    PHYSICAL EXAM:  Constitutional:  -- Normal body habitus  -- Awake, alert, and oriented x3, no apparent distress Eyes:  -- Pupils equally round and reactive to light  -- No scleral icterus, B/L no occular discharge Ear, nose, throat: -- Neck is FROM WNL -- No jugular venous distension  Pulmonary:  -- No wheezes or rhales -- Equal breath sounds bilaterally -- Breathing non-labored at rest Cardiovascular:  -- S1, S2 present  -- No pericardial rubs  Gastrointestinal:  -- Abdomen soft, nontender, non-distended, no guarding or rebound tenderness --  No abdominal masses appreciated, pulsatile or otherwise Anorectal: -- Exam limited due to pain, no gross blood or obvious fistula, fissure, or hemorrhoids Musculoskeletal and Integumentary:  -- Wounds or skin discoloration: Left gluteal cleft exquisitely tender focal erythema and swelling with induration and fluctuance  -- Extremities: B/L UE and LE FROM, hands and feet warm, no edema  Neurologic:  -- Motor function: Intact and symmetric -- Sensation: Intact and symmetric Psychiatric:  -- Mood and affect WNL  Labs:  CBC Latest Ref Rng & Units 11/01/2017 09/11/2017 08/16/2017  WBC 3.6 - 11.0 K/uL 12.2(H) 9.4 5.4  Hemoglobin 12.0 - 16.0 g/dL 12.6 14.6 13.4  Hematocrit 35.0 - 47.0 % 37.3 44.9 40.9  Platelets 150 - 440 K/uL 164 213 171   CMP Latest Ref Rng & Units 11/01/2017 09/11/2017 08/16/2017  Glucose 65 - 99 mg/dL 115(H) 104(H) 116(H)  BUN 6 - 20 mg/dL 13 17 22(H)  Creatinine 0.44 - 1.00 mg/dL 0.80 1.08(H) 0.85  Sodium 135 - 145 mmol/L 136 140 140  Potassium 3.5 - 5.1 mmol/L 3.8 4.4 3.8  Chloride 101 - 111 mmol/L 105 106 109  CO2 22 - 32 mmol/L 25 26 24   Calcium 8.9 - 10.3 mg/dL 8.6(L) 10.1 8.8(L)  Total Protein 6.5 - 8.1 g/dL 6.6 7.7 -  Total Bilirubin 0.3 - 1.2 mg/dL 1.1 0.7 -  Alkaline Phos 38 - 126 U/L 62 77 -  AST 15 - 41 U/L 23 28 -  ALT 14 - 54 U/L 13(L) 26 -   Imaging studies:  CT Pelvis with Contrast (11/01/2017) -  personally reviewed and discussed with patient and ED physician Dr. Mariea Clonts 1. Irregular 5.6 x 2.4 x 1.7 cm abscess in the medial left gluteal cleft with prominent surrounding inflammatory change. Nonspecific circumferential anal wall thickening. 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 2. Asymmetric mild left inguinal lymphadenopathy, nonspecific, probably reactive. 3. Mild sigmoid diverticulosis.  Assessment/Plan: (ICD-10's: L02.31) 67 y.o. female with a Left gluteal cleft abscess, complicated by pertinent comorbidities including HTN, CKD, CHF, chronic pancreatitis, sick sinus syndrome with bradycardia s/p pacemaker implantation (2012), chronic therapeutic anticoagulation for atrial fibrillation s/p TIA/stroke, chronic pain attributed to chronic pancreatitis, chronic ongoing tobacco abuse (smoking), and major depression disorder with a history of ovarian cancer.   - pain control prn, antibiotics   - all risks, benefits, and alternatives to incision and drainage of Left gluteal cleft abscess were discussed with the patient , all of her questions were answered to her expressed satisfaction, patient expresses she wishes to proceed, and informed consent was obtained.  - will proceed with prompt bedside incision and drainage of Left gluteal cleft abscess  - complete course of oral antibiotics, resume Eliquis anticoagulation tomorrow  - once daily dressing changes and outpatient surgical follow-up in 1 week  - instructed patient to call if any questions or concerns  All of the above findings and recommendations were discussed with the patient, her ED RN, and ED physician, and all of patient's questions were answered to her expressed satisfaction.  Thank you for the opportunity to participate in this patient's care.   -- Marilynne Drivers Rosana Hoes, MD, [place]: [facility] - Partnering for exceptional care. Office: [phone]

## 2017-11-01 NOTE — Procedures (Addendum)
SURGICAL PROCEDURE REPORT  DATE OF PROCEDURE: 11/01/2017  ATTENDING: Corene Cornea E. Rosana Hoes, MD  ANESTHESIA: Local  PRE-OPERATIVE DIAGNOSIS: Left buttock abscess (icd-10's: L02.31)  POST-OPERATIVE DIAGNOSIS: Left buttock abscess (icd-10's: L02.31)  PROCEDURE(S):  1.) Incision and drainage of Left buttock abscess (cpt: 10060)  INTRAOPERATIVE FINDINGS: ~40 mL of foul-smelling brownish foul-smelling purulent fluid under significant pressure from Left buttock  ESTIMATED BLOOD LOSS: Minimal (<20 mL)   URINE OUTPUT: No Foley catheter   SPECIMENS: Contents of Left buttock abscess for culture  IMPLANTS: None  DRAINS: None  COMPLICATIONS: None apparent  CONDITION AT END OF PROCEDURE: Hemodynamically stable and awake  DISPOSITION OF PATIENT: PACU  INDICATIONS FOR PROCEDURE:  Patient is a 67 y.o. female who presented to [facility] ED with Left buttock pain, erythema, and swelling x 4 days. All risks, benefits, and alternatives to incision and drainage of Left buttock abscess were discussed with the patient, all of patient's questions were answered to her expressed satisfaction, and informed consent was obtained and documented.  DETAILS OF PROCEDURE: Patient was appropriately identified. With patient laying with her Left side down, operative site was prepped with betadine and draped in the usual sterile fashion, and following a brief time out, the focus of maximal fluctuance was identified and a 2 cm incision was made using a #11 blade scalpel, upon which >40 mL of foul-smelling brownish purulent fluid was immediately released under significant pressure and deep abscess culture was obtained. Additional fluid was then expressed. The abscess cavity was then irrigated and dried. Patient was not able to tolerate insertion of gauze packing. Accordingly, surrounding skin was cleaned and dried, and a sterile dry gauze and adhesive dressing was applied.  Post-procedure, patient reports substantially improved buttock pressure and pain.  I was present for all aspects of the above procedure, and no operative complications were apparent.

## 2017-11-01 NOTE — ED Provider Notes (Signed)
The patient was seen by surgery.  The patient had her abscess I&D.  Surgery did not think the patient required admission after the previous EEG.  They did recommend outpatient treatment with surgery follow up and antibiotics and pain medication. Patient did seem somewhat improved after I and D. Will plan on discharging with pain medication and antibiotics.    Nance Pear, MD 11/01/17 [phone]

## 2017-11-01 NOTE — ED Provider Notes (Signed)
[facility] Emergency Department Provider Note  ____________________________________________  Time seen: Approximately 9:16 AM  I have reviewed the triage vital signs and the nursing notes.   HISTORY  Chief Complaint Abscess    HPI Laconya Clere is a 67 y.o. female with a history of sick sinus syndrome status post pacemaker, ovarian cancer in remission, HTN, presenting with left buttock pain.  The patient reports that since Sunday, she has had a progressively worsening left buttock pain with swelling and erythema.  She has had associated generalized weakness, nausea without vomiting.  She has been having shaking chills without fever.  She does not have pain with defecation.  She has tried taking a bath, which did not improve her symptoms.  Buttock pain is worse when she puts any pressure on her left buttock; she is unable to sit due to her pain.  She has not tried any medications for her pain.  Past Medical History:  Diagnosis Date  . Bradycardia   . Cancer ([place])    ovarian cancer  oophrectomy w/ only "1/4 ovary remaining"  . CHF (congestive heart failure) ([place])   . Chronic kidney disease   . Depression   . Hypertension   . Pancreatitis   . SSS (sick sinus syndrome) ([place])    PPM implant 03/08/11 - [employer] [phone] - SN [id number]    Patient Active Problem List   Diagnosis Date Noted  . TIA (transient ischemic attack) 08/15/2017  . Psychophysiological insomnia 05/19/2017  . Bipolar disorder, current episode mixed, moderate ([place]) 04/17/2017  . Recurrent pancreatitis 04/17/2017  . Ready to quit smoking 04/17/2017  . Atypical chest pain 02/25/2017  . Abnormal EKG 02/25/2017  . Cardiac pacemaker in situ 03/29/2016  . History of malignant neoplasm of cervix 03/29/2016  . Hypertensive disorder 03/29/2016  . Kidney stone 03/29/2016  . Multiple nodules of lung 03/29/2016  . Acute pyelonephritis 07/26/2014    Past Surgical History:  Procedure Laterality  Date  . ABDOMINAL HYSTERECTOMY    . APPENDECTOMY    . PACEMAKER IMPLANT  03/08/2011   [employer] [id number] [phone]) - Implanted For SSS    Current Outpatient Rx  . Order #: [id number] Class: Normal  . Order #: [id number] Class: Normal  . Order #: [id number] Class: Normal  . Order #: [id number] Class: Normal  . Order #: [id number] Class: Normal  . Order #: [id number] Class: Normal  . Order #: [id number] Class: Normal    Allergies Sulfa antibiotics; Acetaminophen; and Trazodone and nefazodone  Family History  Problem Relation Age of Onset  . Dementia Mother   . Heart attack Father        Died age 15  . Heart disease Sister        Congential  . Heart disease Sister        "Some kind of heart surgery."  . Fibromyalgia Daughter     Social History Social History   Tobacco Use  . Smoking status: Current Every Day Smoker    Packs/day: 1.00    Years: 50.00    Pack years: 50.00    Types: Cigarettes  . Smokeless tobacco: Never Used  . Tobacco comment: one pack every 3 days  Substance Use Topics  . Alcohol use: No  . Drug use: No    Review of Systems Constitutional: No fever/chills.  No lightheadedness or syncope.  Positive generalized weakness.   Eyes: No visual changes. ENT: No sore throat. No congestion or rhinorrhea. Cardiovascular: Denies chest pain. Denies palpitations. Respiratory: Denies shortness of  breath.  No cough. Gastrointestinal: No abdominal pain.  Positive nausea, no vomiting.  No diarrhea.  No constipation. Positive anorexia; the patient has not been able to eat any food due to poor appetite for the past 3 days. Genitourinary: Negative for dysuria.  Positive left buttock pain.  No pain with defecation. Musculoskeletal: Negative for back pain. Skin: Negative for rash. Neurological: Negative for headaches. No focal numbness, tingling or weakness.     ____________________________________________   PHYSICAL EXAM:  VITAL SIGNS: ED Triage Vitals  Enc Vitals  Group     BP 11/01/17 0911 (!) 145/86     Pulse Rate 11/01/17 0911 74     Resp 11/01/17 0911 18     Temp 11/01/17 0911 98.4 F (36.9 C)     Temp src --      SpO2 11/01/17 0911 96 %     Weight 11/01/17 0908 121 lb (54.9 kg)     Height --      Head Circumference --      Peak Flow --      Pain Score 11/01/17 0908 7     Pain Loc --      Pain Edu? --      Excl. in [place]? --     Constitutional: Alert and oriented.  Answers questions appropriately.  Chronically ill-appearing, uncomfortable appearing with positional changes but nontoxic. Eyes: Conjunctivae are normal.  EOMI. No scleral icterus. Head: Atraumatic. Nose: No congestion/rhinnorhea. Mouth/Throat: Mucous membranes are mildly dry.  Poor dentition. Neck: No stridor.  Supple.  No JVD.  No meningismus peer Cardiovascular: Normal rate, regular rhythm. No murmurs, rubs or gallops.  Respiratory: Normal respiratory effort.  No accessory muscle use or retractions. Lungs CTAB.  No wheezes, rales or ronchi. Gastrointestinal: Soft, nontender and nondistended.  No guarding or rebound.  No peritoneal signs.   Genitourinary: Left buttock with erythema and warmth, as well as mild induration near the midline crease.  Rectal examination is attempted and the patient is unable to tolerate due to pain.  There is no evidence of fluctuance or superficial abscess.  The skin is intact. Musculoskeletal: No LE edema. Neurologic:  A&Ox3.  Speech is clear.  Face and smile are symmetric.  EOMI.  Moves all extremities well. Skin:  Skin is warm, dry and intact. No rash noted. Psychiatric: Mood and affect are normal.   ____________________________________________   LABS (all labs ordered are listed, but only abnormal results are displayed)  Labs Reviewed  CBC - Abnormal; Notable for the following components:      Result Value   WBC 12.2 (*)    All other components within normal limits  URINALYSIS, COMPLETE (UACMP) WITH MICROSCOPIC - Abnormal; Notable for the  following components:   Color, Urine YELLOW (*)    APPearance CLEAR (*)    Hgb urine dipstick SMALL (*)    Ketones, ur 5 (*)    All other components within normal limits  COMPREHENSIVE METABOLIC PANEL - Abnormal; Notable for the following components:   Glucose, Bld 115 (*)    Calcium 8.6 (*)    Albumin 3.3 (*)    ALT 13 (*)    All other components within normal limits  CULTURE, BLOOD (ROUTINE X 2)  CULTURE, BLOOD (ROUTINE X 2)  URINE CULTURE   ____________________________________________  EKG  Not indicated ____________________________________________  RADIOLOGY  Ct Pelvis W Contrast  Result Date: 11/01/2017 CLINICAL DATA:  Left buttock pain, clinical concern for abscess. Reported history of cervical cancer with hysterectomy.  EXAM: CT PELVIS WITH CONTRAST TECHNIQUE: Multidetector CT imaging of the pelvis was performed using the standard protocol following the bolus administration of intravenous contrast. CONTRAST:  14mL ISOVUE-300 IOPAMIDOL (ISOVUE-300) INJECTION 61% COMPARISON:  09/11/2017 CT abdomen/pelvis. FINDINGS: Urinary Tract:  Normal bladder.  Normal caliber pelvic ureters. Bowel: There is an irregular 5.6 x 2.4 x 1.7 cm subcutaneous abscess in the medial left gluteal cleft (series 3/image 114 and series 9/image 94) with thick enhancing wall and surrounding prominent fat stranding. There is circumferential anal wall thickening. Underlying left perianal fistula suspected, poorly delineated at CT (series 8/image 77). Mild sigmoid diverticulosis. No dilated pelvic bowel loops. No additional sites of bowel wall thickening in the pelvis. Vascular/Lymphatic: Mild left inguinal lymphadenopathy with asymmetric left inguinal nodal hyperenhancement, measuring up to 1.3 cm (series 3/image 72). Bilateral iliofemoral atherosclerosis. Reproductive: Status post hysterectomy, with no abnormal findings at the vaginal cuff. Right ovary appears normal. Left ovary not discretely visualized. No  suspicious adnexal findings. Other:  No pelvic ascites.  No pneumoperitoneum in the pelvis. Musculoskeletal: No aggressive appearing focal osseous lesions. IMPRESSION: 1. Irregular 5.6 x 2.4 x 1.7 cm abscess in the medial left gluteal cleft with prominent surrounding inflammatory change. Nonspecific circumferential anal wall thickening. 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 2. Asymmetric mild left inguinal lymphadenopathy, nonspecific, probably reactive. 3. Mild sigmoid diverticulosis. Electronically Signed   By: Ilona Sorrel M.D.   On: 11/01/2017 11:15    ____________________________________________   PROCEDURES  Procedure(s) performed: None  Procedures  Critical Care performed: No ____________________________________________   INITIAL IMPRESSION / ASSESSMENT AND PLAN / ED COURSE  Pertinent labs & imaging results that were available during my care of the patient were reviewed by me and considered in my medical decision making (see chart for details).  67 y.o. female with 4 days of progressively worsening left buttock pain, erythema, swelling, and systemic symptoms including weakness and nausea with shaking chills.  Overall, the patient is hemodynamically stable.  However, I am concerned for a significant left buttock cellulitis.  The patient also needs to be evaluated for rectal or perirectal abscess, or subcutaneous abscess, and less likely necrotizing fasciitis, and will undergo CT examination.  She will receive intravenous fluids, symptomatic treatment, and immediate empiric antibiotics.  The patient will require admission.  ----------------------------------------- 1:02 PM on 11/01/2017 -----------------------------------------  The patient's CT scan does show a left gluteal abscess with the largest measurement of 5.6 cm, and there is some concern for a perianal fistula.  I spoke with Dr. Rosana Hoes, the surgeon on-call for admission.  At this time, the  patient continues to remain hemodynamically stable she does continue to have some pain although it was initially improved with Dilaudid so I will re-dose her.  She has received intravenous fluids as well as antibiotics. ____________________________________________  FINAL CLINICAL IMPRESSION(S) / ED DIAGNOSES  Final diagnoses:  Abscess, gluteal, left  Perianal fistula         NEW MEDICATIONS STARTED DURING THIS VISIT:  New Prescriptions   No medications on file      Eula Listen, MD 11/01/17 1303

## 2017-11-03 LAB — URINE CULTURE

## 2017-11-06 LAB — CULTURE, BLOOD (ROUTINE X 2)
Culture: NO GROWTH
Culture: NO GROWTH

## 2017-11-07 ENCOUNTER — Ambulatory Visit (INDEPENDENT_AMBULATORY_CARE_PROVIDER_SITE_OTHER): Payer: Medicare Other | Admitting: Nurse Practitioner

## 2017-11-07 ENCOUNTER — Encounter: Payer: Self-pay | Admitting: Nurse Practitioner

## 2017-11-07 VITALS — BP 132/80 | HR 53 | Temp 98.2°F | Resp 16 | Ht 62.0 in | Wt 121.0 lb

## 2017-11-07 DIAGNOSIS — L0231 Cutaneous abscess of buttock: Secondary | ICD-10-CM | POA: Diagnosis not present

## 2017-11-07 NOTE — Progress Notes (Signed)
Subjective:    Patient ID: Lori Orr, female    DOB: 07-06-1950, 67 y.o.   MRN: [id number]  Lori Orr is a 67 y.o. female presenting on 11/07/2017 for Cyst (Had abcess on buttock removed in Hospital [facility]-Wants to have it checked. Not sure when to take a shower. )   HPI Abscess followup Pt was treated for left gluteal abscess on 5/22 in ED.  Dr. Tama High (Gen Surgery) performed I&D and requested pt to followup in his office the following day.   Pt did not followup.  Presents today for wound check and advice on showering.  Pt notes no additional fever, drainage, or significant pain.  She continues to take antibiotics as prescribed.  Social History   Tobacco Use  . Smoking status: Former Smoker    Packs/day: 1.00    Years: 50.00    Pack years: 50.00    Types: Cigarettes    Start date: 10/24/2017  . Smokeless tobacco: Never Used  . Tobacco comment: one pack every 3 days  Substance Use Topics  . Alcohol use: No  . Drug use: No    Review of Systems Per HPI unless specifically indicated above     Objective:    BP 132/80   Pulse (!) 53   Temp 98.2 F (36.8 C) (Oral)   Resp 16   Ht 5\' 2"  (1.575 m)   Wt 121 lb (54.9 kg)   SpO2 100%   BMI 22.13 kg/m   Wt Readings from Last 3 Encounters:  11/07/17 121 lb (54.9 kg)  11/01/17 121 lb (54.9 kg)  10/18/17 121 lb 6.4 oz (55.1 kg)    Physical Exam  Constitutional: She is oriented to person, place, and time. She appears well-developed and well-nourished. No distress.  HENT:  Head: Normocephalic and atraumatic.  Cardiovascular: Normal rate, regular rhythm, S1 normal, S2 normal, normal heart sounds and intact distal pulses.  Pulmonary/Chest: Effort normal and breath sounds normal. No respiratory distress.  Genitourinary:     Neurological: She is alert and oriented to person, place, and time.  Skin: Skin is warm and dry.  Psychiatric: She has a normal mood and affect. Her behavior is normal.  Vitals reviewed.     Results for orders placed or performed during the hospital encounter of 11/01/17  Blood culture (routine x 2)  Result Value Ref Range   Specimen Description BLOOD BLOOD RIGHT HAND    Special Requests      BOTTLES DRAWN AEROBIC AND ANAEROBIC Blood Culture results may not be optimal due to an excessive volume of blood received in culture bottles   Culture      NO GROWTH 5 DAYS Performed at [facility], [address] [place], [place] [address]    Report Status 11/06/2017 FINAL   Blood culture (routine x 2)  Result Value Ref Range   Specimen Description BLOOD RIGHT ANTECUBITAL    Special Requests      BOTTLES DRAWN AEROBIC AND ANAEROBIC Blood Culture results may not be optimal due to an excessive volume of blood received in culture bottles   Culture      NO GROWTH 5 DAYS Performed at [facility], [address] [place], [place] [address]    Report Status 11/06/2017 FINAL   Urine culture  Result Value Ref Range   Specimen Description      URINE, RANDOM Performed at [facility], [address] [place], [place] [address]    Special Requests  NONE Performed at [facility], [address] [place], [place] [address]    Culture MULTIPLE SPECIES PRESENT, SUGGEST RECOLLECTION (A)    Report Status 11/03/2017 FINAL   CBC  Result Value Ref Range   WBC 12.2 (H) 3.6 - 11.0 K/uL   RBC 4.16 3.80 - 5.20 MIL/uL   Hemoglobin 12.6 12.0 - 16.0 g/dL   HCT 37.3 35.0 - 47.0 %   MCV 89.8 80.0 - 100.0 fL   MCH 30.4 26.0 - 34.0 pg   MCHC 33.9 32.0 - 36.0 g/dL   RDW 13.7 11.5 - 14.5 %   Platelets 164 150 - 440 K/uL  Urinalysis, Complete w Microscopic  Result Value Ref Range   Color, Urine YELLOW (A) YELLOW   APPearance CLEAR (A) CLEAR   Specific Gravity, Urine 1.017 1.005 - 1.030   pH 6.0 5.0 - 8.0   Glucose, UA NEGATIVE NEGATIVE mg/dL   Hgb urine dipstick SMALL (A) NEGATIVE   Bilirubin Urine NEGATIVE NEGATIVE   Ketones, ur 5 (A) NEGATIVE  mg/dL   Protein, ur NEGATIVE NEGATIVE mg/dL   Nitrite NEGATIVE NEGATIVE   Leukocytes, UA NEGATIVE NEGATIVE   RBC / HPF 0-5 0 - 5 RBC/hpf   WBC, UA 0-5 0 - 5 WBC/hpf   Bacteria, UA NONE SEEN NONE SEEN   Squamous Epithelial / LPF 0-5 0 - 5   Mucus PRESENT   Comprehensive metabolic panel  Result Value Ref Range   Sodium 136 135 - 145 mmol/L   Potassium 3.8 3.5 - 5.1 mmol/L   Chloride 105 101 - 111 mmol/L   CO2 25 22 - 32 mmol/L   Glucose, Bld 115 (H) 65 - 99 mg/dL   BUN 13 6 - 20 mg/dL   Creatinine, Ser 0.80 0.44 - 1.00 mg/dL   Calcium 8.6 (L) 8.9 - 10.3 mg/dL   Total Protein 6.6 6.5 - 8.1 g/dL   Albumin 3.3 (L) 3.5 - 5.0 g/dL   AST 23 15 - 41 U/L   ALT 13 (L) 14 - 54 U/L   Alkaline Phosphatase 62 38 - 126 U/L   Total Bilirubin 1.1 0.3 - 1.2 mg/dL   GFR calc non Af Amer >60 >60 mL/min   GFR calc Af Amer >60 >60 mL/min   Anion gap 6 5 - 15      Assessment & Plan:   Problem List Items Addressed This Visit    None    Visit Diagnoses    Abscess, gluteal, left    -  Primary    Well healing abscess with mild periwound erythema remaining.  Pt continues on antibiotic therapy for infection.  No current systemic symptoms.  Advised pt to bath as normal.  Keep wound clean and dry.  If new drainage, pain should contact [facility] or [facility] (Dr. Rosana Hoes).  Follow up 1 week for additional wound check after antibiotics are complete.   Follow up plan: Return in about 1 week (around 11/14/2017) for wound check.  Cassell Smiles, DNP, AGPCNP-BC Adult Gerontology Primary Care Nurse Practitioner [facility] 11/07/2017, 11:40 AM

## 2017-11-07 NOTE — Patient Instructions (Addendum)
Lori Orr,   Thank you for coming in to clinic today.  1.  Skin is well healing.  Continue with bathing as normal.  Keep wound dry and clean. If your wound starts draining, please call [facility].  Continue antibiotics ciprofloxacin and metronidazole until they are fully complete.  Return on Monday June 3rd for another wound check.  Please schedule a follow-up appointment with Cassell Smiles, AGNP. Return in about 1 week (around 11/14/2017) for wound check.  If you have any other questions or concerns, please feel free to call the clinic or send a message through [place]. You may also schedule an earlier appointment if necessary.  You will receive a survey after today's visit either digitally by e-mail or paper by [employer]. Your experiences and feedback matter to [place].  Please respond so we know how we are doing as we provide care for you.   Cassell Smiles, DNP, AGNP-BC Adult Gerontology Nurse Practitioner [facility]

## 2017-11-09 DIAGNOSIS — L0231 Cutaneous abscess of buttock: Secondary | ICD-10-CM | POA: Insufficient documentation

## 2017-11-10 ENCOUNTER — Telehealth: Payer: Self-pay | Admitting: Internal Medicine

## 2017-11-10 NOTE — Telephone Encounter (Signed)
Dr. [name]- FYI only  I called and spoke with the patient. She was calling to make Dr. [name] aware of the "awful" experience she had in the hospital recently. She states she was in bed with a fever for 4 days with pain in her left buttock. She finally couldn't stand the pain and called EMS to take her to the ER for further evaluation at [facility]. Per the patient, she had a CT scan of her left buttock/ rectal area, then a surgeon came in her room to open up the area. The patient states she was never on a heart monitor although her heart was racing, did not receive any anesthestic prior to the surgeon cutting on her, and that their was no female staff present in the room when the procedure was being performed. She states that the performing physician became upset with her when she was screaming in pain and wanted to know why she had not gone to her PCP for this.  Per the patient, she is still on an antibiotic, but has been afebrile since hospital discharge on 11/01/17- she was not admitted for treatment.  She did see her PCP on 11/07/17 and advised her of her experience in the ER.  The patient also states that her PCP swabbed the area of the incision and could tell there was still some infection there.  The patient reinterated to me that she just wanted Dr. [name] to be aware of her experience, especially since she was not on a heart monitor. I advised her I will make him aware, but have also suggested she call the hospital and speak with the office of patient experience to voice her concerns.  The patient verbalizes understanding.

## 2017-11-10 NOTE — Telephone Encounter (Signed)
Patient calling in regards to blood pressure medication Please call to discuss

## 2017-11-14 ENCOUNTER — Telehealth: Payer: Self-pay | Admitting: Internal Medicine

## 2017-11-14 NOTE — Telephone Encounter (Signed)
This patient is usually seen in [place] anyway due to the fact that she can only go on mondays.  She will need to be set up for the [facility] office- will forward to the [facility] to review and call her to schedule.

## 2017-11-14 NOTE — Telephone Encounter (Signed)
Spoke with pt informed her that we had an available apt on 11/27/17 at 3:30pm in the [place] office pt agreeable to this apt.

## 2017-11-14 NOTE — Telephone Encounter (Signed)
Pt states about 5 min ago she had a nose bleed and has not been able to get it stopped.. Pt is on Eliquis.

## 2017-11-14 NOTE — Telephone Encounter (Signed)
The patient's call was transferred directly to triage. Per the patient, she was laying on the couch and her nose started to bleed and has been doing so for the last 5 minutes. She was calling to inquire what to do about this. I advised the patient to pinch the bridge of her nose and hold pressure. I also advised that she may use an ice pack to see if this will help to constrict the vessels in her nose as well. She wanted to know what could cause this. I advised her I am uncertain, but if her sinuses are dry, this may have triggered the bleeding. She is also aware to try nasal saline spray when the bleeding stops as well as neosporin/ polysporin gently applied to the inside of her nose.   The patient voices understanding and is agreeable with the above. She is aware to call back if the bleeding last for a prolonged period of time.

## 2017-11-14 NOTE — Telephone Encounter (Signed)
Patient called to reschedule Pacer Check Patient states only the 17th will work for her and her transportation  No availability for 17th Please advise

## 2017-11-27 ENCOUNTER — Telehealth: Payer: Self-pay | Admitting: Internal Medicine

## 2017-11-27 ENCOUNTER — Ambulatory Visit (INDEPENDENT_AMBULATORY_CARE_PROVIDER_SITE_OTHER): Payer: Medicare Other | Admitting: *Deleted

## 2017-11-27 DIAGNOSIS — I495 Sick sinus syndrome: Secondary | ICD-10-CM

## 2017-11-27 LAB — CUP PACEART INCLINIC DEVICE CHECK
Brady Statistic RA Percent Paced: 91 %
Brady Statistic RV Percent Paced: 4 %
Date Time Interrogation Session: 20190617040000
Implantable Lead Implant Date: 20120925
Implantable Lead Location: 753859
Implantable Lead Location: 753860
Implantable Lead Model: 4135
Implantable Lead Model: 4136
Lead Channel Impedance Value: 540 Ohm
Lead Channel Impedance Value: 610 Ohm
Lead Channel Pacing Threshold Amplitude: 1.1 V
Lead Channel Pacing Threshold Pulse Width: 0.4 ms
Lead Channel Sensing Intrinsic Amplitude: 12 mV
Lead Channel Setting Pacing Amplitude: 2.4 V
Lead Channel Setting Pacing Pulse Width: 0.4 ms
Lead Channel Setting Sensing Sensitivity: 2.5 mV
MDC IDC LEAD IMPLANT DT: 20120925
MDC IDC LEAD SERIAL: 28984048
MDC IDC LEAD SERIAL: 29041902
MDC IDC MSMT LEADCHNL RA PACING THRESHOLD AMPLITUDE: 0.5 V
MDC IDC MSMT LEADCHNL RA PACING THRESHOLD PULSEWIDTH: 0.4 ms
MDC IDC PG IMPLANT DT: 20120925
MDC IDC PG SERIAL: 189779
MDC IDC SET LEADCHNL RA PACING AMPLITUDE: 2 V

## 2017-11-27 NOTE — Telephone Encounter (Signed)
Pt states that since she started Verapamil she has been constapated. Please call to discuss.

## 2017-11-27 NOTE — Progress Notes (Signed)
Pacemaker check in clinic. Normal device function. Thresholds, sensing, impedances consistent with previous measurements. Device programmed to maximize longevity. No mode switch or high ventricular rates noted. Device programmed at appropriate safety margins. Histogram distribution appropriate for patient activity level. Device programmed to optimize intrinsic conduction. Estimated longevity >5 years. Patient will follow up with [facility] in 03-2018. Patient education completed.

## 2017-11-27 NOTE — Telephone Encounter (Signed)
I spoke with the patient regarding her constipation.  She states she has taken stool softners for constipation that is potentially coming from verapamil. She is scheduled for surgery for her pancreas on Friday. I have advised her that Dr. [name] is out of the office this week and would need to review prior to changing her medications. I have recommended that she try miralax. She is having stomach pain at this point.  I have advised that she try a fleets enema to see if this will help. She is agreeable.

## 2017-11-27 NOTE — Telephone Encounter (Signed)
I left a message for the patient to call. 

## 2017-12-01 DIAGNOSIS — I130 Hypertensive heart and chronic kidney disease with heart failure and stage 1 through stage 4 chronic kidney disease, or unspecified chronic kidney disease: Secondary | ICD-10-CM | POA: Diagnosis not present

## 2017-12-01 DIAGNOSIS — I509 Heart failure, unspecified: Secondary | ICD-10-CM | POA: Diagnosis not present

## 2017-12-01 DIAGNOSIS — R935 Abnormal findings on diagnostic imaging of other abdominal regions, including retroperitoneum: Secondary | ICD-10-CM | POA: Diagnosis not present

## 2017-12-01 DIAGNOSIS — N189 Chronic kidney disease, unspecified: Secondary | ICD-10-CM | POA: Diagnosis not present

## 2017-12-01 DIAGNOSIS — Z79899 Other long term (current) drug therapy: Secondary | ICD-10-CM | POA: Diagnosis not present

## 2017-12-01 DIAGNOSIS — Z7901 Long term (current) use of anticoagulants: Secondary | ICD-10-CM | POA: Diagnosis not present

## 2017-12-01 DIAGNOSIS — I495 Sick sinus syndrome: Secondary | ICD-10-CM | POA: Diagnosis not present

## 2017-12-01 DIAGNOSIS — F17210 Nicotine dependence, cigarettes, uncomplicated: Secondary | ICD-10-CM | POA: Diagnosis not present

## 2017-12-01 DIAGNOSIS — K861 Other chronic pancreatitis: Secondary | ICD-10-CM | POA: Diagnosis not present

## 2017-12-01 DIAGNOSIS — Z950 Presence of cardiac pacemaker: Secondary | ICD-10-CM | POA: Diagnosis not present

## 2017-12-01 DIAGNOSIS — Z882 Allergy status to sulfonamides status: Secondary | ICD-10-CM | POA: Diagnosis not present

## 2017-12-01 DIAGNOSIS — Z8673 Personal history of transient ischemic attack (TIA), and cerebral infarction without residual deficits: Secondary | ICD-10-CM | POA: Diagnosis not present

## 2017-12-01 NOTE — Telephone Encounter (Signed)
Patient calling  States she has a questions regarding the information for her GI doctor Would like to know if she can be seen sooner in [place] office Please call to discuss

## 2017-12-01 NOTE — Telephone Encounter (Signed)
I called and spoke with the patient and her daughter. The patient reports she had an endoscopy this morning with Dr. Sydnee Levans to follow up on the status of her pancreas. Per the patient, Dr. Reginia Forts told her the pancreas wasn't the issue, but that there is decreased blood flow to her stomach and she needs to follow up with the cardiologist. I advised her that Dr. Reginia Forts will need to send [place] her information and his findings. She is aware that if this is a vascular issue, then she will need to see someone other than Dr. [name] to address this. The patient and her daughter are agreeable and voice understanding. I have given the patient our fax # to have Dr. Sander Nephew office send [place] the findings and his recommendations based on her study today.

## 2017-12-08 ENCOUNTER — Telehealth: Payer: Self-pay | Admitting: Internal Medicine

## 2017-12-08 NOTE — Telephone Encounter (Signed)
Pt calling stating she would like a call back States she saw a provider at [facility] who is a pancreas specialist  They told her she has a stomach disease  They advised her since she has 4 blocked arteries  She is going out of town for her boyfriend is not doing well She needs to know for they told her at [facility] that a certain type of heart doctor handles this kind of issue, she is needing to know which kind of heart doctor handles this  Please advise

## 2017-12-08 NOTE — Telephone Encounter (Signed)
I called and spoke with the patient and advised her that I am waiting to get her endoscopy report from her physician at [facility]. Per the patient, she and her daughter called [facility] on 6/21 when we spoke and requested the records. I advised I have still not seen these come in and am unsure how to advise her without the report.  I tried to clarify with her that Dr. Sydnee Levans is her physician at [facility] as I tried to look him up online and couldn't find him. Per the patient, she advised that was the correct name, but currently can't find his contact info. She advised she will call back.  She also reports that she is having constipation on verapamil. She has been taking stool softners but this doesn't help. Otherwise she feels the medication is working for her. I advised I will need to review with Dr. [name]. She did state her boyfriend is not doing well in [place] (cancer) and that she will be leaving on Sunday to go see him.  I looked back in her medical record and found that Dr. [name] had spoken with a Dr. Gayleen Orem earlier in the year. I called Dr. Glenna Durand office at [facility] [phone] and was directed to Medical Records. Per medical records, the patient had no release of information on file.  They asked that I send a request on our fax cover sheet to [phone]. Fax sent and confirmation received.   Will await endoscopy report and review verapamil with Dr. [name].

## 2017-12-11 NOTE — Telephone Encounter (Signed)
Patient has questions regarding medication  Would like to speak with Nira Conn Please call to discuss

## 2017-12-13 NOTE — Telephone Encounter (Signed)
Additional phone note open regarding the patient's medications- waiting on MD to review.   Will document on that additional encounter.

## 2017-12-14 NOTE — Telephone Encounter (Signed)
Lets change verapamil to diltiazam also at 240 thnks

## 2017-12-15 MED ORDER — DILTIAZEM HCL ER COATED BEADS 240 MG PO CP24: 240.0000 mg | ORAL_CAPSULE | Freq: Every day | ORAL | 3 refills | Status: DC

## 2017-12-15 NOTE — Telephone Encounter (Signed)
I called and spoke with the patient.  She is aware I have tried to be in touch with [facility] to get her endoscopy report. I have called and faxed a request. She is aware I have called again this afternoon and left a message for [facility] Records. I have advised her I will back in touch with [facility] next week. [phone].  She is also aware of Dr. Olin Pia recommendations that she can stop verapamil and switch to diltiazem 240 mg once daily to see if this will help with her constipation.  She is agreeable. She is currently in [place], [place]. I have advised her I cannot pull up the pharmacy in [place]- she asked that I send the RX to the [facility] in [place] and she will have the [facility] in [place] call them to transfer the RX as she has done this before.

## 2017-12-19 NOTE — Telephone Encounter (Signed)
I called and spoke with [facility] Records this morning. They state they never received my first fax for release of information, even though I verified that the fax # I sent this too was correct and I received a confirmation. I have re-submitted this to [phone]. Confirmation received again. I faxed this about 11:36 am today.  I have called this afternoon and left a message for Medical Records at [facility] to make sure they received the fax. I asked that they call me back to confirm.

## 2017-12-21 NOTE — Telephone Encounter (Signed)
Upper endoscopy report received from [facility] dated 12/01/17.  Impression:  - There was no sign of significant pathology in the entire pancreas - There was no sign of significant pathology in the common bile duct, in the common hepatic duct and in the gallbladder body - Suspect calcifications seen on imaging are vascular in nature immediately adjacent to the pancreas  Recommendations included "consider mesenteric duplex for further work up of abdominal pain."  I have reviewed the above with Dr. [name]. Per Dr. [name]- ok to order a mesenteric artery ultrasound.  I have left a message for the patient to call to discuss the above.

## 2017-12-21 NOTE — Telephone Encounter (Signed)
I spoke with the patient and advised her that the endoscopy report was received and reviewed by Dr. [name]. I offered that we could set her up for a mesenteric ultrasound. Per the patient, she is unsure how long she will be in [place]. She is scheduled to see the cardiologist that she has seen there in the past, Dr. Tilden Fossa on 01/25/18. I advised her I will fax a copy of the endoscopy report to his office for her appointment in August. She is agreeable.

## 2017-12-22 NOTE — Telephone Encounter (Signed)
Late entry-  Last office note for Dr. [name] most recent device check/ copy of [facility] endoscopy report all faxed to Dr. Tilden Fossa on 12/21/17 for the patient's upcoming appt with him on 01/25/18 in [place].  Faxed to [phone]- confirmation received.

## 2018-01-25 DIAGNOSIS — G459 Transient cerebral ischemic attack, unspecified: Secondary | ICD-10-CM | POA: Diagnosis not present

## 2018-01-25 DIAGNOSIS — I495 Sick sinus syndrome: Secondary | ICD-10-CM | POA: Diagnosis not present

## 2018-01-25 DIAGNOSIS — I4891 Unspecified atrial fibrillation: Secondary | ICD-10-CM | POA: Diagnosis not present

## 2018-01-25 DIAGNOSIS — Z950 Presence of cardiac pacemaker: Secondary | ICD-10-CM | POA: Diagnosis not present

## 2018-01-25 DIAGNOSIS — I2510 Atherosclerotic heart disease of native coronary artery without angina pectoris: Secondary | ICD-10-CM | POA: Diagnosis not present

## 2018-03-05 DIAGNOSIS — Z23 Encounter for immunization: Secondary | ICD-10-CM | POA: Diagnosis not present

## 2018-03-07 DIAGNOSIS — R079 Chest pain, unspecified: Secondary | ICD-10-CM | POA: Diagnosis not present

## 2018-03-07 DIAGNOSIS — E039 Hypothyroidism, unspecified: Secondary | ICD-10-CM | POA: Diagnosis not present

## 2018-03-07 DIAGNOSIS — Z950 Presence of cardiac pacemaker: Secondary | ICD-10-CM | POA: Diagnosis not present

## 2018-03-07 DIAGNOSIS — Z9114 Patient's other noncompliance with medication regimen: Secondary | ICD-10-CM | POA: Diagnosis not present

## 2018-03-07 DIAGNOSIS — I2510 Atherosclerotic heart disease of native coronary artery without angina pectoris: Secondary | ICD-10-CM | POA: Diagnosis not present

## 2018-03-07 DIAGNOSIS — N200 Calculus of kidney: Secondary | ICD-10-CM | POA: Diagnosis not present

## 2018-03-07 DIAGNOSIS — Z8711 Personal history of peptic ulcer disease: Secondary | ICD-10-CM | POA: Diagnosis not present

## 2018-03-07 DIAGNOSIS — G4700 Insomnia, unspecified: Secondary | ICD-10-CM | POA: Diagnosis not present

## 2018-03-07 DIAGNOSIS — E785 Hyperlipidemia, unspecified: Secondary | ICD-10-CM | POA: Diagnosis not present

## 2018-03-07 DIAGNOSIS — I129 Hypertensive chronic kidney disease with stage 1 through stage 4 chronic kidney disease, or unspecified chronic kidney disease: Secondary | ICD-10-CM | POA: Diagnosis not present

## 2018-03-07 DIAGNOSIS — F0390 Unspecified dementia without behavioral disturbance: Secondary | ICD-10-CM | POA: Diagnosis not present

## 2018-03-07 DIAGNOSIS — N179 Acute kidney failure, unspecified: Secondary | ICD-10-CM | POA: Diagnosis not present

## 2018-03-07 DIAGNOSIS — F17210 Nicotine dependence, cigarettes, uncomplicated: Secondary | ICD-10-CM | POA: Diagnosis not present

## 2018-03-07 DIAGNOSIS — R1012 Left upper quadrant pain: Secondary | ICD-10-CM | POA: Diagnosis not present

## 2018-03-07 DIAGNOSIS — F319 Bipolar disorder, unspecified: Secondary | ICD-10-CM | POA: Diagnosis not present

## 2018-03-07 DIAGNOSIS — Z590 Homelessness: Secondary | ICD-10-CM | POA: Diagnosis not present

## 2018-03-07 DIAGNOSIS — G8929 Other chronic pain: Secondary | ICD-10-CM | POA: Diagnosis not present

## 2018-03-07 DIAGNOSIS — N183 Chronic kidney disease, stage 3 (moderate): Secondary | ICD-10-CM | POA: Diagnosis not present

## 2018-03-07 DIAGNOSIS — E876 Hypokalemia: Secondary | ICD-10-CM | POA: Diagnosis not present

## 2018-03-07 DIAGNOSIS — R45851 Suicidal ideations: Secondary | ICD-10-CM | POA: Diagnosis not present

## 2018-03-08 DIAGNOSIS — N200 Calculus of kidney: Secondary | ICD-10-CM | POA: Diagnosis not present

## 2018-03-10 DIAGNOSIS — E876 Hypokalemia: Secondary | ICD-10-CM | POA: Diagnosis not present

## 2018-03-10 DIAGNOSIS — N183 Chronic kidney disease, stage 3 (moderate): Secondary | ICD-10-CM | POA: Diagnosis not present

## 2018-03-10 DIAGNOSIS — F319 Bipolar disorder, unspecified: Secondary | ICD-10-CM | POA: Diagnosis not present

## 2018-03-10 DIAGNOSIS — R45851 Suicidal ideations: Secondary | ICD-10-CM | POA: Diagnosis not present

## 2018-03-10 DIAGNOSIS — R109 Unspecified abdominal pain: Secondary | ICD-10-CM | POA: Diagnosis not present

## 2018-03-10 DIAGNOSIS — I10 Essential (primary) hypertension: Secondary | ICD-10-CM | POA: Diagnosis not present

## 2018-03-10 DIAGNOSIS — F0390 Unspecified dementia without behavioral disturbance: Secondary | ICD-10-CM | POA: Diagnosis not present

## 2018-03-11 DIAGNOSIS — Z9114 Patient's other noncompliance with medication regimen: Secondary | ICD-10-CM | POA: Diagnosis not present

## 2018-03-11 DIAGNOSIS — E876 Hypokalemia: Secondary | ICD-10-CM | POA: Diagnosis not present

## 2018-03-11 DIAGNOSIS — N179 Acute kidney failure, unspecified: Secondary | ICD-10-CM | POA: Diagnosis not present

## 2018-03-11 DIAGNOSIS — R1012 Left upper quadrant pain: Secondary | ICD-10-CM | POA: Diagnosis not present

## 2018-03-11 DIAGNOSIS — N183 Chronic kidney disease, stage 3 (moderate): Secondary | ICD-10-CM | POA: Diagnosis not present

## 2018-03-11 DIAGNOSIS — F319 Bipolar disorder, unspecified: Secondary | ICD-10-CM | POA: Diagnosis not present

## 2018-03-11 DIAGNOSIS — R45851 Suicidal ideations: Secondary | ICD-10-CM | POA: Diagnosis not present

## 2018-03-11 DIAGNOSIS — F329 Major depressive disorder, single episode, unspecified: Secondary | ICD-10-CM | POA: Diagnosis not present

## 2018-04-09 ENCOUNTER — Encounter: Payer: Medicare Other | Admitting: Internal Medicine

## 2018-04-09 DIAGNOSIS — R0989 Other specified symptoms and signs involving the circulatory and respiratory systems: Secondary | ICD-10-CM

## 2018-04-10 ENCOUNTER — Encounter: Payer: Self-pay | Admitting: Internal Medicine

## 2018-07-09 ENCOUNTER — Ambulatory Visit (INDEPENDENT_AMBULATORY_CARE_PROVIDER_SITE_OTHER): Payer: Medicare Other | Admitting: Nurse Practitioner

## 2018-07-09 ENCOUNTER — Encounter: Payer: Self-pay | Admitting: Nurse Practitioner

## 2018-07-09 VITALS — BP 131/60 | HR 60 | Resp 16 | Ht 62.0 in | Wt 111.6 lb

## 2018-07-09 DIAGNOSIS — R1030 Lower abdominal pain, unspecified: Secondary | ICD-10-CM

## 2018-07-09 DIAGNOSIS — N200 Calculus of kidney: Secondary | ICD-10-CM | POA: Diagnosis not present

## 2018-07-09 DIAGNOSIS — Z950 Presence of cardiac pacemaker: Secondary | ICD-10-CM

## 2018-07-09 DIAGNOSIS — R82998 Other abnormal findings in urine: Secondary | ICD-10-CM | POA: Diagnosis not present

## 2018-07-09 LAB — POCT URINALYSIS DIPSTICK
Bilirubin, UA: NEGATIVE
Blood, UA: NEGATIVE
Glucose, UA: NEGATIVE
Ketones, UA: NEGATIVE
Nitrite, UA: NEGATIVE
Protein, UA: NEGATIVE
Spec Grav, UA: 1.01 (ref 1.010–1.025)
Urobilinogen, UA: 0.2 E.U./dL
pH, UA: 6 (ref 5.0–8.0)

## 2018-07-09 NOTE — Patient Instructions (Addendum)
Manju Ruberg,   Thank you for coming in to clinic today.  1. Please call [facility] Cardiology for resuming care with Dr. [name] [facility] [phone]  2. Referral to UROLOGY for kidney stone and kidney cyst follow-up: [facility] [address] [place] Phone: [phone] - They will call you to schedule.  Please schedule a follow-up appointment with Cassell Smiles, AGNP. Return in about 3 months (around 10/08/2018) for hypertension.  If you have any other questions or concerns, please feel free to call the clinic or send a message through [place]. You may also schedule an earlier appointment if necessary.  You will receive a survey after today's visit either digitally by e-mail or paper by [employer]. Your experiences and feedback matter to [place].  Please respond so we know how we are doing as we provide care for you.   Cassell Smiles, DNP, AGNP-BC Adult Gerontology Nurse Practitioner [place]

## 2018-07-09 NOTE — Progress Notes (Signed)
Subjective:    Patient ID: Lori Orr, female    DOB: 1950/09/25, 68 y.o.   MRN: [id number]  Lori Orr is a 68 y.o. female presenting on 07/09/2018 for Flank Pain (kidney stones- see Abdominal scans last one 09/2017. She has been in [place] for months caring for sick boyfriend. Never did see Nephrologist needs new referral) and Hypertension (needs ref to [facility] hasnt been in over a year. has Duanne Guess )   HPI Urology referral: Patient with nephrolithiasis, never evaluated/treated.  Patient is seeing no blood in urine, but is having regular to constant flank pain. Urinary frequency, No problems with bladder incontinence, but does have urgency.   Hypertension, Pacemaker in situ No recent follow-up with Dr. Klein/Cardiology. Patient does not interval follow-up with Cardiology in [place], [place] in August 2019.  Due for Follow-up with Dr. [name] in September, but was away.  She is again due for Cardiology follow-up.  - She is not checking BP at home or outside of clinic.    - Current medications: amlodipine 10 mg once daily, lisinopril 40 mg once daily, metoprolol 200 mg 24 hr, cardizem 240 mg 24 hr, tolerating well without side effects - She is not currently symptomatic. - Pt denies headache, lightheadedness, dizziness, changes in vision, chest tightness/pressure, palpitations, leg swelling, sudden loss of speech or loss of consciousness. - She  reports no regular exercise routine. - Her diet is moderate in salt, moderate in fat, and moderate in carbohydrates.   Chronic Pancreatitis Continues pancreatitis care at [facility] and is still connected.  Social History   Tobacco Use  . Smoking status: Former Smoker    Packs/day: 1.00    Years: 50.00    Pack years: 50.00    Types: Cigarettes    Start date: 10/24/2017  . Smokeless tobacco: Never Used  . Tobacco comment: one pack every 3 days  Substance Use Topics  . Alcohol use: No  . Drug use: No   Review of Systems Per HPI  unless specifically indicated above     Objective:    BP 131/60   Pulse 60   Resp 16   Ht 5\' 2"  (1.575 m)   Wt 111 lb 9.6 oz (50.6 kg)   SpO2 100%   BMI 20.41 kg/m   Wt Readings from Last 3 Encounters:  07/09/18 111 lb 9.6 oz (50.6 kg)  11/07/17 121 lb (54.9 kg)  11/01/17 121 lb (54.9 kg)    Physical Exam Vitals signs reviewed.  Constitutional:      General: She is not in acute distress.    Appearance: She is well-developed.  HENT:     Head: Normocephalic and atraumatic.  Cardiovascular:     Rate and Rhythm: Normal rate and regular rhythm.     Pulses:          Radial pulses are 2+ on the right side and 2+ on the left side.       Posterior tibial pulses are 1+ on the right side and 1+ on the left side.     Heart sounds: Normal heart sounds, S1 normal and S2 normal.  Pulmonary:     Effort: Pulmonary effort is normal. No respiratory distress.     Breath sounds: Normal breath sounds and air entry.  Abdominal:     General: Bowel sounds are normal. There is no distension.     Palpations: Abdomen is soft.     Tenderness: There is abdominal tenderness in the epigastric area, left upper quadrant  and left lower quadrant.     Hernia: No hernia is present.  Musculoskeletal:     Right lower leg: No edema.     Left lower leg: No edema.  Skin:    General: Skin is warm and dry.     Capillary Refill: Capillary refill takes less than 2 seconds.  Neurological:     Mental Status: She is alert and oriented to person, place, and time.  Psychiatric:        Attention and Perception: Attention normal.        Mood and Affect: Mood and affect normal.        Behavior: Behavior normal. Behavior is cooperative.    Results for orders placed or performed in visit on 11/27/17  CUP PACEART [facility] DEVICE CHECK  Result Value Ref Range   Date Time Interrogation Session [id number]    Pulse Generator Manufacturer BOST    Pulse Gen Model S606 ALTRUA 60 EL    Pulse Gen Serial Number [id number]      Clinic Name [facility] Pulse Generator Type Implantable Pulse Generator    Implantable Pulse Generator Implant Date [id number]    Implantable Lead Manufacturer [facility]    Implantable Lead Model 4135 Dextrus    Implantable Lead Serial Number [id number]    Implantable Lead Implant Date 56314970    Implantable Lead Location Detail 1 UNKNOWN    Implantable Lead Location [id number]    Implantable Lead Manufacturer [employer]    Implantable Lead Model 4136 Dextrus    Implantable Lead Serial Number [id number]    Implantable Lead Implant Date 50277412    Implantable Lead Location Detail 1 UNKNOWN    Implantable Lead Location [phone]    Lead Channel Setting Sensing Sensitivity 2.5 mV   Lead Channel Setting Sensing Adaptation Mode Fixed Pacing    Lead Channel Setting Pacing Amplitude 2.0 V   Lead Channel Setting Pacing Pulse Width 0.40 ms   Lead Channel Setting Pacing Amplitude 2.4 V   Lead Channel Impedance Value 610 ohm   Lead Channel Pacing Threshold Amplitude 0.5 V   Lead Channel Pacing Threshold Pulse Width 0.40 ms   Lead Channel Impedance Value 540 ohm   Lead Channel Sensing Intrinsic Amplitude 12.0 (>) mV   Lead Channel Pacing Threshold Amplitude 1.1 V   Lead Channel Pacing Threshold Pulse Width 0.40 ms   Battery Status BOS    Brady Statistic RA Percent Paced 91 %   Brady Statistic RV Percent Paced 4 %   Eval Rhythm ApVs 30       Assessment & Plan:   Problem List Items Addressed This Visit    None    Visit Diagnoses    Lower abdominal pain    -  Primary   Relevant Orders   POCT urinalysis dipstick (Completed)   Ambulatory referral to Urology   Urine Culture (Completed)   Nephrolithiasis       Relevant Orders   POCT urinalysis dipstick (Completed)   Ambulatory referral to Urology   Urine leukocytes       Relevant Orders   Urine Culture (Completed)     Patient with history of nephrolithiasis, continuing lower abdomina/pelvic pain.  Patient was lost to follow-up for  urology and requests referral.  Patient has renal cysts as well as nephrolithiasis that were being worked up when she moved temporarily back to [place] to help care for her boyfriend.  Plan: 1. UA dipstick today.   - Culture for leukocyturia  2. Referral urology for pelvic pain, nephrolithiasis. 3. Follow-up prn.  Sick Sinus syndrome, pacemaker Cardiology: referral not needed.  Instructed to call cardiologist to schedule regular follow-up.  Follow up plan: Return in about 3 months (around 10/08/2018) for hypertension.  Cassell Smiles, DNP, AGPCNP-BC Adult Gerontology Primary Care Nurse Practitioner [facility] 07/09/2018, 3:33 PM

## 2018-07-10 LAB — URINE CULTURE

## 2018-07-12 ENCOUNTER — Encounter: Payer: Self-pay | Admitting: Nurse Practitioner

## 2018-07-17 ENCOUNTER — Other Ambulatory Visit: Payer: Self-pay

## 2018-07-17 DIAGNOSIS — E7849 Other hyperlipidemia: Secondary | ICD-10-CM

## 2018-07-17 DIAGNOSIS — I10 Essential (primary) hypertension: Secondary | ICD-10-CM

## 2018-07-17 MED ORDER — AMLODIPINE BESYLATE 10 MG PO TABS: 10.0000 mg | ORAL_TABLET | Freq: Every day | ORAL | 1 refills | Status: DC

## 2018-07-17 MED ORDER — METOPROLOL SUCCINATE ER 200 MG PO TB24: 200.0000 mg | ORAL_TABLET | Freq: Every day | ORAL | 1 refills | Status: AC

## 2018-07-17 MED ORDER — LISINOPRIL 40 MG PO TABS: 40.0000 mg | ORAL_TABLET | Freq: Every day | ORAL | 1 refills | Status: DC

## 2018-07-17 MED ORDER — ATORVASTATIN CALCIUM 40 MG PO TABS: 40.0000 mg | ORAL_TABLET | Freq: Every day | ORAL | 1 refills | Status: DC

## 2018-07-17 NOTE — Telephone Encounter (Signed)
Tramadol prescription will not be written today.

## 2018-07-17 NOTE — Telephone Encounter (Signed)
The pt called requesting refills on all her medication. She states that she came in the office recently and was diagnose with Nephrolithiasis and referred to Urology. She currently have an appt scheduled w/ urology on 07/25/2018 and want to know if you will prescribe her Tramadol as a bridge until her appt with Urology for her flank pain.

## 2018-07-25 ENCOUNTER — Ambulatory Visit: Payer: Self-pay | Admitting: Urology

## 2018-07-26 ENCOUNTER — Encounter: Payer: Self-pay | Admitting: Internal Medicine

## 2018-07-30 ENCOUNTER — Telehealth: Payer: Self-pay | Admitting: Internal Medicine

## 2018-07-30 NOTE — Telephone Encounter (Signed)
Patient calling requesting to speak with Lori Orr Patient states that she is now in [place] to stay but is very sick No further details given when asked, wants to speak with nurse Please call to discuss

## 2018-07-31 NOTE — Telephone Encounter (Signed)
I left a message for the patient to call back 

## 2018-08-02 ENCOUNTER — Encounter: Payer: Self-pay | Admitting: Urology

## 2018-08-02 ENCOUNTER — Ambulatory Visit (INDEPENDENT_AMBULATORY_CARE_PROVIDER_SITE_OTHER): Payer: Medicare Other | Admitting: Urology

## 2018-08-02 VITALS — BP 160/73 | HR 68 | Ht 62.0 in | Wt 113.0 lb

## 2018-08-02 DIAGNOSIS — N200 Calculus of kidney: Secondary | ICD-10-CM

## 2018-08-02 DIAGNOSIS — R1030 Lower abdominal pain, unspecified: Secondary | ICD-10-CM

## 2018-08-02 LAB — URINALYSIS, COMPLETE
Bilirubin, UA: NEGATIVE
Glucose, UA: NEGATIVE
Nitrite, UA: NEGATIVE
Specific Gravity, UA: 1.02 (ref 1.005–1.030)
UUROB: 0.2 mg/dL (ref 0.2–1.0)
pH, UA: 6 (ref 5.0–7.5)

## 2018-08-02 LAB — MICROSCOPIC EXAMINATION

## 2018-08-02 NOTE — Telephone Encounter (Signed)
I left a message for the patient to call back 

## 2018-08-02 NOTE — Progress Notes (Signed)
08/02/2018 3:07 PM   Blair Mesina 1950-12-30 [id number]  Referring provider: Mikey College, NP [place], [place] [address]  CC: Abdominal pain  HPI: I saw Ms. Yokley in urology clinic in consultation from Cassell Smiles, NP for abdominal pain and history of nephrolithiasis.  She is a very poor historian.  She has a number of comorbidities including ovarian cancer, congestive heart failure, depression, bipolar disease, chronic pancreatitis, for lithiasis, and cardiac history on Eliquis.  She reports she has had kidney stones before, and underwent a surgery many years ago with [facility], but she is not sure what this was.  She was recently hospitalized in [place] this fall for pancreatitis.  She reports a 27-monthhistory of left-sided flank pain with nausea.  Severity is moderate.  There are no aggravating or alleviating factors.  She denies any gross hematuria.  She is a current smoker with a 50+ pack year history.   PMH: Past Medical History:  Diagnosis Date  . Bradycardia   . [facility]    ovarian cancer  oophrectomy w/ only "1/4 ovary remaining"  . [place]ive heart failure[place]   . Chronic kidney disease   . Depression   . Hypertension   . Pancreatitis   . S[place]    [place] [id number]   Surgical History: Past Surgical History:  Procedure Laterality Date  . ABDOMINAL HYSTERECTOMY    . APPENDECTOMY    . PACEMAKER IMPLANT  03/08/2011   [employer] [phone] - Implanted For SSS     Allergies:  Allergies  Allergen Reactions  . Sulfa Antibiotics Rash and Hives  . Acetaminophen   . Trazodone And Nefazodone Other (See Comments)     Severe nightmares    Family History: Family History  Problem Relation Age of Onset  . Dementia Mother   . Heart attack Father        Died age 70 . Heart disease Sister        Congential  . Heart disease Sister        "Some kind  of heart surgery."  . Fibromyalgia Daughter     Social History:  reports that she has quit smoking. Her smoking use included cigarettes. She started smoking about 9 months ago. She has a 50.00 pack-year smoking history. She has never used smokeless tobacco. She reports that she does not drink alcohol or use drugs.  ROS: Please see flowsheet from today's date for complete review of systems.  Physical Exam: BP (!) 160/73   Pulse 68   Ht '5\' 2"'  (1.575 m)   Wt 113 lb (51.3 kg)   BMI 20.67 kg/m    Constitutional:  Alert and oriented, No acute distress. Cardiovascular: No clubbing, cyanosis, or edema. Respiratory: Normal respiratory effort, no increased work of breathing. GI: Abdomen is soft, nontender, nondistended, no abdominal masses GU: Left CVA tenderness Lymph: No cervical or inguinal lymphadenopathy. Skin: No rashes, bruises or suspicious lesions. Neurologic: Grossly intact, no focal deficits, moving all 4 extremities. Psychiatric: Normal mood and affect.  Laboratory Data: Creatinine 0.8, EGFR greater than 60 Urinalysis today 0-5 WBCs, 11-30 RBCs, few bacteria, nitrite negative  Pertinent Imaging: None to review  Assessment & Plan:   In summary, the patient is a 68year old female with a number of co-morbidities and chronic abdominal and back pain, history of nephrolithiasis, and new microscopic hematuria.  We discussed common possible  etiologies of hematuria including malignancy, urolithiasis, medical renal disease, and idiopathic. Standard workup recommended by the AUA includes imaging with CT urogram to assess the upper tracts, and cystoscopy.  We also discussed that the CT scan will be able to identify any significant kidney stone disease.  CT urogram and cystoscopy  Billey Co, MD  [facility] [address] [place], [place] [address] [phone]

## 2018-08-02 NOTE — Patient Instructions (Signed)
Cystoscopy  Cystoscopy is a procedure that is used to help diagnose and sometimes treat conditions that affect that lower urinary tract. The lower urinary tract includes the bladder and the tube that drains urine from the bladder out of the body (urethra). Cystoscopy is performed with a thin, tube-shaped instrument with a light and camera at the end (cystoscope). The cystoscope may be hard (rigid) or flexible, depending on the goal of the procedure.The cystoscope is inserted through the urethra, into the bladder. Cystoscopy may be recommended if you have:  Urinary tractinfections that keep coming back (recurring).  Blood in the urine (hematuria).  Loss of bladder control (urinary incontinence) or an overactive bladder.  Unusual cells found in a urine sample.  A blockage in the urethra.  Painful urination.  An abnormality in the bladder found during an intravenous pyelogram (IVP) or CT scan. Cystoscopy may also be done to remove a sample of tissue to be examined under a microscope (biopsy). Tell a health care provider about:  Any allergies you have.  All medicines you are taking, including vitamins, herbs, eye drops, creams, and over-the-counter medicines.  Any problems you or family members have had with anesthetic medicines.  Any blood disorders you have.  Any surgeries you have had.  Any medical conditions you have.  Whether you are pregnant or may be pregnant. What are the risks? Generally, this is a safe procedure. However, problems may occur, including:  Infection.  Bleeding.  Allergic reactions to medicines.  Damage to other structures or organs. What happens before the procedure?  Ask your health care provider about: ? Changing or stopping your regular medicines. This is especially important if you are taking diabetes medicines or blood thinners. ? Taking medicines such as aspirin and ibuprofen. These medicines can thin your blood. Do not take these medicines  before your procedure if your health care provider instructs you not to.  Follow instructions from your health care provider about eating or drinking restrictions.  You may be given antibiotic medicine to help prevent infection.  You may have an exam or testing, such as X-rays of the bladder, urethra, or kidneys.  You may have urine tests to check for signs of infection.  Plan to have someone take you home after the procedure. What happens during the procedure?  To reduce your risk of infection,your health care team will wash or sanitize their hands.  You will be given one or more of the following: ? A medicine to help you relax (sedative). ? A medicine to numb the area (local anesthetic).  The area around the opening of your urethra will be cleaned.  The cystoscope will be passed through your urethra into your bladder.  Germ-free (sterile)fluid will flow through the cystoscope to fill your bladder. The fluid will stretch your bladder so that your surgeon can clearly examine your bladder walls.  The cystoscope will be removed and your bladder will be emptied. The procedure may vary among health care providers and hospitals. What happens after the procedure?  You may have some soreness or pain in your abdomen and urethra. Medicines will be available to help you.  You may have some blood in your urine.  Do not drive for 24 hours if you received a sedative. This information is not intended to replace advice given to you by your health care provider. Make sure you discuss any questions you have with your health care provider. Document Released: 05/27/2000 Document Revised: 03/10/2017 Document Reviewed: 04/16/2015 Elsevier Interactive Patient   Education  2019 Elsevier Inc.  Hematuria, Adult Hematuria is blood in the urine. Blood may be visible in the urine, or it may be identified with a test. This condition can be caused by infections of the bladder, urethra, kidney, or prostate.  Other possible causes include:  Kidney stones.  Cancer of the urinary tract.  Too much calcium in the urine.  Conditions that are passed from parent to child (inherited conditions).  Exercise that requires a lot of energy. Infections can usually be treated with medicine, and a kidney stone usually will pass through your urine. If neither of these is the cause of your hematuria, more tests may be needed to identify the cause of your symptoms. It is very important to tell your health care provider about any blood in your urine, even if it is painless or the blood stops without treatment. Blood in the urine, when it happens and then stops and then happens again, can be a symptom of a very serious condition, including cancer. There is no pain in the initial stages of many urinary cancers. Follow these instructions at home: Medicines  Take over-the-counter and prescription medicines only as told by your health care provider.  If you were prescribed an antibiotic medicine, take it as told by your health care provider. Do not stop taking the antibiotic even if you start to feel better. Eating and drinking  Drink enough fluid to keep your urine clear or pale yellow. It is recommended that you drink 3-4 quarts (2.8-3.8 L) a day. If you have been diagnosed with an infection, it is recommended that you drink cranberry juice in addition to large amounts of water.  Avoid caffeine, tea, and carbonated beverages. These tend to irritate the bladder.  Avoid alcohol because it may irritate the prostate (men). General instructions  If you have been diagnosed with a kidney stone, follow your health care provider's instructions about straining your urine to catch the stone.  Empty your bladder often. Avoid holding urine for long periods of time.  If you are female: ? After a bowel movement, wipe from front to back and use each piece of toilet paper only once. ? Empty your bladder before and after sex.   Pay attention to any changes in your symptoms. Tell your health care provider about any changes or any new symptoms.  It is your responsibility to get your test results. Ask your health care provider, or the department performing the test, when your results will be ready.  Keep all follow-up visits as told by your health care provider. This is important. Contact a health care provider if:  You develop back pain.  You have a fever.  You have nausea or vomiting.  Your symptoms do not improve after 3 days.  Your symptoms get worse. Get help right away if:  You develop severe vomiting and are unable take medicine without vomiting.  You develop severe pain in your back or abdomen even though you are taking medicine.  You pass a large amount of blood in your urine.  You pass blood clots in your urine.  You feel very weak or like you might faint.  You faint. Summary  Hematuria is blood in the urine. It has many possible causes.  It is very important that you tell your health care provider about any blood in your urine, even if it is painless or the blood stops without treatment.  Take over-the-counter and prescription medicines only as told by your health care   provider.  Drink enough fluid to keep your urine clear or pale yellow. This information is not intended to replace advice given to you by your health care provider. Make sure you discuss any questions you have with your health care provider. Document Released: 05/30/2005 Document Revised: 07/02/2016 Document Reviewed: 07/02/2016 Elsevier Interactive Patient Education  2019 Elsevier Inc.  

## 2018-08-02 NOTE — Telephone Encounter (Signed)
Patient returned call

## 2018-08-03 NOTE — Telephone Encounter (Signed)
I left a message for the patient to call back 

## 2018-08-03 NOTE — Telephone Encounter (Signed)
I spoke with the patient. She states she is recently back in town after an extended stay in [place].  She states while she was in [place], she was admitted for pancreatitis. She was told at that time she had blockages on the left and right side of her stomach.  She was also diagnosed with kidney stones that were removed.   She has seen moved back in town and has seen her PCP, Lori Smiles, Lori Orr who referred her to urology for pelvic pain/ kidney stones. The patient states Lori Orr has mentioned having a stomach ultrasound as well, but she is unsure of where to go for this.  She is very tearful today as she is in a lot of pain. The patient states she saw Lori Orr at [facility] yesterday.  She states she has more kidney stones and Lori Orr ordered a urine test that showed blood in her urine.  She states he felt the blood was coming from the bladder. Per the patient, Lori Orr is scheduling her for a CT scan of her kidneys and will follow up with her on 08/22/18 to discuss results and another procedure. She states that he told her that he was going to go up in her bladder to look- she will not be asleep for this. She also states that he told her he was going to find the "stone" that was causing her all the pain and remove that one. She is confused as to why, if she has multiple stones, they all cannot be removed.  I advised the patient that I am unfortunately not able to answer all of her questions as her issues are not cardiac related. I have advised her to call Lori Orr office to ask about the stomach ultrasound she thought she was supposed to have done and to call Urology to clarify what the plan is for her kidney stones/ bladder.  She states her BP has been elevated.  I advised if she will call and just get some clarity on her situation, this may help her BP a little bit as she is clearly frustrated and emotional today. She is aware that her pain could also be  playing a part in her BP being elevated.   She is scheduled to see Lori Orr on 08/28/18 for device follow up.  The patient is agreeable with the above plan and voices understanding.

## 2018-08-06 ENCOUNTER — Other Ambulatory Visit: Payer: Self-pay | Admitting: Nurse Practitioner

## 2018-08-06 ENCOUNTER — Telehealth: Payer: Self-pay | Admitting: Nurse Practitioner

## 2018-08-06 DIAGNOSIS — F3162 Bipolar disorder, current episode mixed, moderate: Secondary | ICD-10-CM

## 2018-08-06 DIAGNOSIS — R109 Unspecified abdominal pain: Secondary | ICD-10-CM

## 2018-08-06 MED ORDER — DICYCLOMINE HCL 10 MG PO CAPS: 10.0000 mg | ORAL_CAPSULE | Freq: Three times a day (TID) | ORAL | 1 refills | Status: DC

## 2018-08-06 MED ORDER — DIVALPROEX SODIUM 250 MG PO DR TAB: 250.0000 mg | DELAYED_RELEASE_TABLET | Freq: Three times a day (TID) | ORAL | 0 refills | Status: DC

## 2018-08-06 MED ORDER — PAROXETINE HCL 20 MG PO TABS: 20.0000 mg | ORAL_TABLET | Freq: Every day | ORAL | 0 refills | Status: DC

## 2018-08-06 NOTE — Telephone Encounter (Signed)
I spoke with the patient and she informed me that she was seen a Teacher, music in [place]. She was recently put on the Depakote while she was in the [employer] in [place]. The pt currently been off the Paxil for a week and states she's been very depress and crying a lot. She took her last Depakote tablet today. She current doesn't have away to get her this week, but scheduled an appt for next Monday. Please advise

## 2018-08-06 NOTE — Telephone Encounter (Signed)
Pt needs refills on depakote, paxil and dicyclomine 10 mg 4 times a day sent to [facility].  Her call back [phone]

## 2018-08-06 NOTE — Telephone Encounter (Signed)
Fills for all meds have been sent.  Follow-up on Monday is okay.  Will send psychiatry referral at that time.

## 2018-08-06 NOTE — Telephone Encounter (Signed)
The pt was notified and she verbalize understanding. I also had confirmed her appt time with her for next Monday at 3:00pm.

## 2018-08-06 NOTE — Telephone Encounter (Signed)
None of the meds are medications I have ever prescribed.  Patient may need to resume with psychiatry for paxil and depakote.  Patient was taking depakote for bipolar disorder based on my records.  Please verify absence of seizure disorder.    IF patient has seizure disorder, I will continue while she has consult with neurology. IF patient needs, I can continue dicyclomine without another visit.  Patient will need visit to continue paxil and depakote for Bipolar disorder.

## 2018-08-13 ENCOUNTER — Encounter: Payer: Self-pay | Admitting: Nurse Practitioner

## 2018-08-13 ENCOUNTER — Ambulatory Visit (INDEPENDENT_AMBULATORY_CARE_PROVIDER_SITE_OTHER): Payer: Medicare Other | Admitting: Nurse Practitioner

## 2018-08-13 ENCOUNTER — Other Ambulatory Visit: Payer: Self-pay

## 2018-08-13 ENCOUNTER — Encounter: Payer: Medicare Other | Admitting: Internal Medicine

## 2018-08-13 VITALS — BP 150/79 | HR 57 | Temp 98.3°F | Ht 62.0 in | Wt 116.0 lb

## 2018-08-13 DIAGNOSIS — M79601 Pain in right arm: Secondary | ICD-10-CM | POA: Diagnosis not present

## 2018-08-13 DIAGNOSIS — D229 Melanocytic nevi, unspecified: Secondary | ICD-10-CM | POA: Diagnosis not present

## 2018-08-13 DIAGNOSIS — K861 Other chronic pancreatitis: Secondary | ICD-10-CM

## 2018-08-13 DIAGNOSIS — H0014 Chalazion left upper eyelid: Secondary | ICD-10-CM | POA: Diagnosis not present

## 2018-08-13 DIAGNOSIS — F3162 Bipolar disorder, current episode mixed, moderate: Secondary | ICD-10-CM | POA: Diagnosis not present

## 2018-08-13 MED ORDER — ERYTHROMYCIN 5 MG/GM OP OINT: 1.0000 "application " | TOPICAL_OINTMENT | Freq: Three times a day (TID) | OPHTHALMIC | 0 refills | Status: AC

## 2018-08-13 MED ORDER — PANCRELIPASE (LIP-PROT-AMYL) 6000-19000 UNITS PO CPEP: 1.0000 | ORAL_CAPSULE | Freq: Three times a day (TID) | ORAL | 0 refills | Status: DC

## 2018-08-13 NOTE — Patient Instructions (Addendum)
Lori Orr,   Thank you for coming in to clinic today.  1. Please call with your new phone number as soon as you have this.  2. For your stye: START erythromycin ointment only in LEFT eye three times daily for 7 days.  3. For your RIGHT arm - Ultrasound of the nodules  4. After you finish your kidney/bladder workup, we will consider adding an ultrasound of RUQ abdomen to further evaluate your pain if needed.  5. You will receive a phone call for psychiatry.  This is very important to keep your medications safe.  Please schedule a follow-up appointment with Cassell Smiles, AGNP. Return in about 6 weeks (around 09/24/2018) for abdominal pain.  If you have any other questions or concerns, please feel free to call the clinic or send a message through [facility]. You may also schedule an earlier appointment if necessary.  You will receive a survey after today's visit either digitally by e-mail or paper by [employer]. Your experiences and feedback matter to [place].  Please respond so we know how we are doing as we provide care for you.   Cassell Smiles, DNP, AGNP-BC Adult Gerontology Nurse Practitioner [place]

## 2018-08-13 NOTE — Progress Notes (Signed)
Subjective:    Patient ID: Lori Orr, female    DOB: May 20, 1951, 68 y.o.   MRN: [id number]  Lori Orr is a 68 y.o. female presenting on 08/13/2018 for Anxiety (depression); Arm Pain (Right arm pain x6 mths); and Stye (lpossible stye on the left eye x 2weeks )   HPI Not currently taking Eliquis - due to see Cardiology this month.  RIGHT arm pain  Patient has had Right arm pain for last 6 months.  She fell about 2-3 weeks ago and has persistent bruise in middle of right bicep.  She reports two knots appeared on biceps about 6 months ago, but were not painful.  After her fall, knots started to become painful.   Stye left eye x 2 weeks Patient will see her eye doctor tomorrow.  Continues to worsen.  Patient had some bleeding from left eye several months ago.  This did not affect her eye.    Anxiety/depression Currently on paroxetine 20 mg once daily.  Patient has not seen psychiatry due to transient lifestyle to [place]. She was to reconnect when back in the area for extended period.  Now should benefit from psychiatry referral for anxiety and depression.    Patient reports staying on paroxetine and depakote since leaving [place].  Abdominal pain Patient stopped Creon and has unkown reason why.  Patient continues to have chronic pancreatitis.  Again, has not had regular GI follow-up with recent relocation back to [place].  Pain is mostly limited to LUQ and epigastric pain.   GAD 7 : Generalized Anxiety Score 08/13/2018  Nervous, Anxious, on Edge 0  Control/stop worrying 0  Worry too much - different things 2  Trouble relaxing 3  Restless 3  Easily annoyed or irritable 0  Afraid - awful might happen 0  Total GAD 7 Score 8  Anxiety Difficulty Not difficult at all     Depression screen [facility] 2/9 08/13/2018 07/09/2018 04/17/2017  Decreased Interest 3 3 3   Down, Depressed, Hopeless 3 0 2  PHQ - 2 Score 6 3 5   Altered sleeping 3 1 3   Tired, decreased energy 3 1 3   Change in  appetite 0 1 0  Feeling bad or failure about yourself  0 0 0  Trouble concentrating 0 0 2  Moving slowly or fidgety/restless 0 0 1  Suicidal thoughts 0 0 0  PHQ-9 Score 12 6 14   Difficult doing work/chores Not difficult at all - Somewhat difficult     Social History   Tobacco Use  . Smoking status: Current Every Day Smoker    Packs/day: 0.10    Years: 50.00    Pack years: 5.00    Types: Cigarettes  . Smokeless tobacco: Never Used  . Tobacco comment: one pack every 3 days  Substance Use Topics  . Alcohol use: No  . Drug use: No    Review of Systems Per HPI unless specifically indicated above     Objective:    BP (!) 150/79 (BP Location: Left Arm, Patient Position: Sitting, Cuff Size: Normal)   Pulse (!) 57   Temp 98.3 F (36.8 C) (Oral)   Ht 5\' 2"  (1.575 m)   Wt 116 lb (52.6 kg)   BMI 21.22 kg/m   Wt Readings from Last 3 Encounters:  08/13/18 116 lb (52.6 kg)  08/02/18 113 lb (51.3 kg)  07/09/18 111 lb 9.6 oz (50.6 kg)    Physical Exam Vitals signs reviewed.  Constitutional:      General: She  is not in acute distress.    Appearance: She is well-developed.  HENT:     Head: Normocephalic and atraumatic.     Mouth/Throat:     Mouth: Mucous membranes are moist.     Pharynx: Oropharynx is clear.  Cardiovascular:     Rate and Rhythm: Normal rate and regular rhythm.     Pulses:          Radial pulses are 2+ on the right side and 2+ on the left side.       Posterior tibial pulses are 1+ on the right side and 1+ on the left side.     Heart sounds: Normal heart sounds, S1 normal and S2 normal.  Pulmonary:     Effort: Pulmonary effort is normal. No respiratory distress.     Breath sounds: Normal breath sounds and air entry.  Abdominal:     General: Abdomen is flat. Bowel sounds are normal. There is no distension.     Palpations: Abdomen is soft.     Tenderness: There is generalized abdominal tenderness and tenderness in the epigastric area and left upper quadrant.      Hernia: No hernia is present.  Musculoskeletal:     Right upper arm: She exhibits tenderness (between biceps heads pt has ecchymosis w tenderness.  ALso more exquisite tenderness at site of two cystic masses below skin.  Unknown type of cyst - non-fluctuant, hard nodular.). She exhibits no bony tenderness, no swelling, no edema and no deformity.     Right lower leg: No edema.     Left lower leg: No edema.  Skin:    General: Skin is warm and dry.     Capillary Refill: Capillary refill takes less than 2 seconds.  Neurological:     Mental Status: She is alert and oriented to person, place, and time.  Psychiatric:        Attention and Perception: Attention normal.        Mood and Affect: Mood and affect normal.        Behavior: Behavior normal. Behavior is cooperative.        Thought Content: Thought content normal.        Judgment: Judgment normal.    Results for orders placed or performed in visit on 08/02/18  Microscopic Examination  Result Value Ref Range   WBC, UA 0-5 0 - 5 /hpf   RBC, UA 11-30 (A) 0 - 2 /hpf   Epithelial Cells (non renal) 0-10 0 - 10 /hpf   Casts Present (A) None seen /lpf   Cast Type Hyaline casts N/A   Mucus, UA Present (A) Not Estab.   Bacteria, UA Few (A) None seen/Few  Urinalysis, Complete  Result Value Ref Range   Specific Gravity, UA 1.020 1.005 - 1.030   pH, UA 6.0 5.0 - 7.5   Color, UA Yellow Yellow   Appearance Ur Cloudy (A) Clear   Leukocytes, UA Trace (A) Negative   Protein, UA Trace (A) Negative/Trace   Glucose, UA Negative Negative   Ketones, UA Trace (A) Negative   RBC, UA 3+ (A) Negative   Bilirubin, UA Negative Negative   Urobilinogen, Ur 0.2 0.2 - 1.0 mg/dL   Nitrite, UA Negative Negative   Microscopic Examination See below:       Assessment & Plan:   Problem List Items Addressed This Visit      Other   Bipolar disorder, current episode mixed, moderate ([place]) - Primary Continues to remain suboptimally controlled.  Recent  inpatient hospitalization while in [place].  Records in [employer].  Patient has not established with psychiatry.  Referral will be made to [facility] to resume after prior referral > 1 year ago.  Patient is now planning to stay in [facility]  Continue current medications escitalopram with Depakote to bridge to psychiatry.  Discussed need for specialty management. Follow-up 6 weeks if no with psychiatry.   Relevant Orders   Ambulatory referral to Psychiatry   Valproic Acid level    Other Visit Diagnoses    Chalazion left upper eyelid     Acute to subacute chalazion of left upper eyelid.  No recent ocular antibiotics have been used.   - Normal visual acuity in office - No evidence of complication, foreign body, or extending eyelid / pre-septal cellulitis - Inadequate conservative treatment at home  Plan: 1. Reassurance, most likely self limited, Start erythromycin ointment four times daily to affected eye. Start moist warm compresses over Left eyelid 10-15 min at a time, up to 4-6 times daily until resolution of 2 weeks. 3. Frequent hand washing, avoid rubbing / scratching eye 4. Strict return precautions for spreading infection 5. Follow-up 1-2 weeks as needed   Relevant Medications   erythromycin ophthalmic ointment   Right arm pain     Patient with trauma and ecchymosis.  Pain located most near two small cysts noted subcutaneously.    Plan: 1. Continue with heat/ice.   2. Quantify masses with arm [place]. 3. Reassured patient this is likely self limited.   Relevant Orders   US SOFT TISSUE RT UPPER EXTREMITY LTD (NON-VASCULAR) (Completed)   Nevus     Patient with irregular nevus border.  Desires to consider going to [place].  Referral placed to local dermatologist today.   Relevant Orders   Ambulatory referral to Dermatology   Other chronic pancreatitis [facility])    Patient with acute on chronic abdominal pain, worsening severity. Patient without significant findings on CT recently.   Encourage patient to take daily Creon, follow-up with GI.  Follow-up here only prn.   Relevant Medications   Pancrelipase, Lip-Prot-Amyl, 6000 units CPEP      Meds ordered this encounter  Medications  . erythromycin ophthalmic ointment    Sig: Place 1 application into the left eye 3 (three) times daily for 7 days.    Dispense:  21 g    Refill:  0    Order Specific Question:   Supervising Provider    Answer:   Olin Hauser [[id number]]  . Pancrelipase, Lip-Prot-Amyl, 6000 units CPEP    Sig: Take 1 capsule (6,000 Units total) by mouth 3 (three) times daily with meals.    Dispense:  120 capsule    Refill:  0    Order Specific Question:   Supervising Provider    Answer:   Olin Hauser [[id number]]   Follow up plan: Return in about 6 weeks (around 09/24/2018) for abdominal pain.  Cassell Smiles, DNP, AGPCNP-BC Adult Gerontology Primary Care Nurse Practitioner [facility] 08/13/2018, 3:13 PM

## 2018-08-15 ENCOUNTER — Telehealth: Payer: Self-pay | Admitting: Nurse Practitioner

## 2018-08-15 NOTE — Telephone Encounter (Signed)
Incoming call

## 2018-08-15 NOTE — Telephone Encounter (Signed)
Pt asked for something for pain.  She has tried everything over the counter and heating pad.  She can't get any relief and she is not sleeping.  Her call back number is [phone]

## 2018-08-15 NOTE — Telephone Encounter (Signed)
We discussed this at her last appointment.  Tramadol is not a good long-term treatment. She needs to continue pancreatic enzymes for her regular abdominal pain which were reordered at her last visit and followup with GI.  If pain is severe, she may need to have imaging performed at the hospital.  Labs may need to be done to see if she has acute on chronic pancreatitis that should be treated in the hospital as well. Recommend ER visit for further evaluation.

## 2018-08-16 ENCOUNTER — Other Ambulatory Visit: Payer: Self-pay

## 2018-08-16 ENCOUNTER — Ambulatory Visit
Admission: RE | Admit: 2018-08-16 | Discharge: 2018-08-16 | Disposition: A | Discharge status: Home / Self Care | Payer: Medicare Other | Source: Ambulatory Visit | Attending: Nurse Practitioner | Admitting: Nurse Practitioner

## 2018-08-16 ENCOUNTER — Telehealth: Payer: Self-pay | Admitting: Internal Medicine

## 2018-08-16 DIAGNOSIS — M79601 Pain in right arm: Secondary | ICD-10-CM | POA: Insufficient documentation

## 2018-08-16 DIAGNOSIS — R2231 Localized swelling, mass and lump, right upper limb: Secondary | ICD-10-CM | POA: Diagnosis not present

## 2018-08-16 NOTE — Telephone Encounter (Signed)
I spoke with the patient. She called to voice continued concerns about the amount of pain she is in due to kidney stones/ possible pancreas issues.  She has urologic testing scheduled on 3/11. She has an ultrasound of her upper extremities scheduled today to follow up on nodules that are seen there. She is scheduled to see Dr. [name] back on 3/17 for routine follow up.   She had no cardiac complaints today. She did mention her BP is elevated, and she has addressed this with her PCP.  I have advised her that her elevated BP is most likely due to the pain she is feeling now and the stress she is under due to her multiple medical issues at this time.    advised her we will see her as planned and she can update on her follow up testing at that time. The patient voices understanding and is agreeable.

## 2018-08-16 NOTE — Telephone Encounter (Signed)
Attempted to contact the patient, no answer. VM not set-up.  °

## 2018-08-16 NOTE — Telephone Encounter (Signed)
Pt request Lori Lemmings, RN to return her call, states she "has some health issues, and if she is put to sleep wants to make sure I'm ok". Pt would not give any more information. Please call.

## 2018-08-17 NOTE — Telephone Encounter (Signed)
The pt was notified of Lori Orr recommendation, no questions or concern.

## 2018-08-20 ENCOUNTER — Other Ambulatory Visit: Payer: Self-pay | Admitting: Family Medicine

## 2018-08-20 ENCOUNTER — Ambulatory Visit
Admission: RE | Admit: 2018-08-20 | Discharge: 2018-08-20 | Disposition: A | Discharge status: Home / Self Care | Payer: Medicare Other | Source: Ambulatory Visit | Attending: Urology | Admitting: Urology

## 2018-08-20 ENCOUNTER — Encounter: Payer: Self-pay | Admitting: Nurse Practitioner

## 2018-08-20 ENCOUNTER — Other Ambulatory Visit
Admission: RE | Admit: 2018-08-20 | Discharge: 2018-08-20 | Disposition: A | Discharge status: Home / Self Care | Payer: Medicare Other | Source: Home / Self Care | Attending: Urology | Admitting: Urology

## 2018-08-20 ENCOUNTER — Telehealth: Payer: Self-pay

## 2018-08-20 DIAGNOSIS — N200 Calculus of kidney: Secondary | ICD-10-CM

## 2018-08-20 LAB — CREATININE, SERUM
Creatinine, Ser: 0.81 mg/dL (ref 0.44–1.00)
GFR calc Af Amer: >60 mL/min (ref >60)
GFR calc non Af Amer: >60 mL/min (ref >60)

## 2018-08-20 LAB — BUN: BUN: 25 mg/dL — ABNORMAL HIGH (ref 8–23)

## 2018-08-20 MED ORDER — IOHEXOL 300 MG/ML  SOLN
125.0000 mL | Freq: Once | INTRAMUSCULAR | Status: AC | PRN
  Administered 2018-08-20: 125 mL via INTRAVENOUS

## 2018-08-20 NOTE — Telephone Encounter (Addendum)
   The pt and her daughter was notified and she informed me that she will get in touch with the insurance company.     ----- Message from Lori College, NP sent at 08/20/2018  3:47 PM EDT ----- Regarding: Change PCP Pt has an assigned pcp on card as Cephas Darby on [address].  The referral will need to come from that office or the patient will need to contact their insurance company and have the pcp changed on card.   Lori Orr, please follow-up on PCP change for [name]'s psychiatry referral

## 2018-08-20 NOTE — Telephone Encounter (Signed)
Attempted to contact the pt, no answer. No vm set-up.

## 2018-08-22 ENCOUNTER — Other Ambulatory Visit: Payer: Self-pay

## 2018-08-22 ENCOUNTER — Encounter: Payer: Self-pay | Admitting: Urology

## 2018-08-22 ENCOUNTER — Ambulatory Visit (INDEPENDENT_AMBULATORY_CARE_PROVIDER_SITE_OTHER): Payer: Medicare Other | Admitting: Urology

## 2018-08-22 VITALS — BP 123/70 | HR 58 | Ht 62.0 in | Wt 119.0 lb

## 2018-08-22 DIAGNOSIS — R3129 Other microscopic hematuria: Secondary | ICD-10-CM

## 2018-08-22 DIAGNOSIS — R109 Unspecified abdominal pain: Secondary | ICD-10-CM

## 2018-08-22 LAB — URINALYSIS, COMPLETE
Bilirubin, UA: NEGATIVE
Glucose, UA: NEGATIVE
Ketones, UA: NEGATIVE
Leukocytes, UA: NEGATIVE
Nitrite, UA: NEGATIVE
Protein, UA: NEGATIVE
RBC, UA: NEGATIVE
Specific Gravity, UA: 1.025 (ref 1.005–1.030)
Urobilinogen, Ur: 0.2 mg/dL (ref 0.2–1.0)
pH, UA: 5.5 (ref 5.0–7.5)

## 2018-08-22 NOTE — Progress Notes (Signed)
Cystoscopy Procedure Note:  Indication: Microscopic hematuria  After informed consent and discussion of the procedure and its risks, Lori Orr was positioned and prepped in the standard fashion. Cystoscopy was performed with a flexible cystoscope. The urethra, bladder neck and entire bladder was visualized in a standard fashion. The bladder mucosa was grossly normal throughout. The ureteral orifices were visualized in their normal location and orientation.   Imaging: CT urogram personally reviewed.  No nephrolithiasis, hydronephrosis, renal masses, or filling defects  Findings: Normal cystoscopy  Assessment and Plan: Follow-up as needed Her abdominal and back pain is not from a urologic cause  Lori Madrid, MD 08/22/2018

## 2018-08-28 ENCOUNTER — Encounter: Payer: Medicare Other | Admitting: Internal Medicine

## 2018-09-05 ENCOUNTER — Telehealth: Payer: Self-pay | Admitting: Nurse Practitioner

## 2018-09-05 NOTE — Telephone Encounter (Signed)
Pt  stopped by with new card requesting referral to  Psychiatry

## 2018-09-07 ENCOUNTER — Telehealth: Payer: Self-pay | Admitting: Internal Medicine

## 2018-09-07 NOTE — Telephone Encounter (Signed)
Patient calling Patient was scheduled for 3/31 with Dr [name] in office but was told he may want to do an evisit  Patient states she would like to come in as her pacemaker has not been checked in a year  Patient needs a 3 days in advance notice in order to order transportation, has already missed that opportunity for 3/31 Please call to advise on appointment - evisit or in office

## 2018-09-07 NOTE — Telephone Encounter (Signed)
Spoke with patient. Advised of current COVID-19 situation and explained we are only seeing urgent appointments in the office. Pt verbalizes understanding, reports she is doing well at this time, but knows she is due for a PPM check. PPM battery was estimated at >5 years remaining at last OV in 11/2017. Device is not remote capable. Will send this note to Dr. [name] and Nira Conn, RN for further advisement. Pt is in agreement with this plan and denies additional questions at this time.  She requests call back with adequate notice (>3 days) prior to appointment so that she can schedule transportation.

## 2018-09-10 ENCOUNTER — Telehealth: Payer: Self-pay | Admitting: Internal Medicine

## 2018-09-10 NOTE — Telephone Encounter (Signed)
Attempted to call the patient.  I left a message to please call back so we can switch her appointment to either a telephone/ video visit with Dr. [name] tomorrow.

## 2018-09-11 ENCOUNTER — Telehealth (INDEPENDENT_AMBULATORY_CARE_PROVIDER_SITE_OTHER): Payer: Medicare Other | Admitting: Internal Medicine

## 2018-09-11 ENCOUNTER — Encounter: Payer: Self-pay | Admitting: Internal Medicine

## 2018-09-11 ENCOUNTER — Encounter: Payer: Medicare Other | Admitting: Internal Medicine

## 2018-09-11 ENCOUNTER — Other Ambulatory Visit: Payer: Self-pay

## 2018-09-11 DIAGNOSIS — I480 Paroxysmal atrial fibrillation: Secondary | ICD-10-CM | POA: Diagnosis not present

## 2018-09-11 DIAGNOSIS — Z950 Presence of cardiac pacemaker: Secondary | ICD-10-CM | POA: Diagnosis not present

## 2018-09-11 DIAGNOSIS — F3162 Bipolar disorder, current episode mixed, moderate: Secondary | ICD-10-CM | POA: Diagnosis not present

## 2018-09-11 DIAGNOSIS — I495 Sick sinus syndrome: Secondary | ICD-10-CM

## 2018-09-11 MED ORDER — DIVALPROEX SODIUM 250 MG PO DR TAB: 250.0000 mg | DELAYED_RELEASE_TABLET | Freq: Three times a day (TID) | ORAL | 0 refills | Status: DC

## 2018-09-11 MED ORDER — APIXABAN 5 MG PO TABS: 5.0000 mg | ORAL_TABLET | Freq: Two times a day (BID) | ORAL | 6 refills | Status: DC

## 2018-09-11 MED ORDER — PAROXETINE HCL 20 MG PO TABS: 20.0000 mg | ORAL_TABLET | Freq: Every day | ORAL | 0 refills | Status: DC

## 2018-09-11 NOTE — Telephone Encounter (Signed)
Attempted to contact the patient. I left a message on voice mail for patient to contact office reference her appointment so we could change this appointment to a telephone visit or a web visit.

## 2018-09-11 NOTE — Telephone Encounter (Signed)
Virtual Visit Pre-Appointment Phone Call  Steps For Call:  1. Confirm consent - "In the setting of the current Covid19 crisis, you are scheduled for a (phone or video) visit with your provider on (date) at (time).  Just as we do with many in-office visits, in order for you to participate in this visit, we must obtain consent.  If you'd like, I can send this to your mychart (if signed up) or email for you to review.  Otherwise, I can obtain your verbal consent now.  All virtual visits are billed to your insurance company just like a normal visit would be.  By agreeing to a virtual visit, we'd like you to understand that the technology does not allow for your provider to perform an examination, and thus may limit your provider's ability to fully assess your condition.  Finally, though the technology is pretty good, we cannot assure that it will always work on either your or our end, and in the setting of a video visit, we may have to convert it to a phone-only visit.  In either situation, we cannot ensure that we have a secure connection.  Are you willing to proceed?"  2. Give patient instructions for WebEx download to smartphone as below if video visit  3. Advise patient to be prepared with any vital sign or heart rhythm information, their current medicines, and a piece of paper and pen handy for any instructions they may receive the day of their visit  4. Inform patient they will receive a phone call 15 minutes prior to their appointment time (may be from unknown caller ID) so they should be prepared to answer  5. Confirm that appointment type is correct in Epic appointment notes (video vs telephone)    TELEPHONE CALL NOTE  Lori Orr has been deemed a candidate for a follow-up tele-health visit to limit community exposure during the Covid-19 pandemic. I spoke with the patient via phone to ensure availability of phone/video source, confirm preferred email & phone number, and discuss  instructions and expectations.  I reminded Lori Orr to be prepared with any vital sign and/or heart rhythm information that could potentially be obtained via home monitoring, at the time of her visit. I reminded Lori Orr to expect a phone call at the time of her visit if her visit.  Did the patient verbally acknowledge consent to treatment? Verbal Consent Obtained , Also Email sent to daughter   Alba Destine, [place] 09/11/2018 12:07 PM CONSENT FOR TELE-HEALTH VISIT - PLEASE REVIEW  I hereby voluntarily request, consent and authorize [facility] and its employed or contracted physicians, physician assistants, nurse practitioners or other licensed health care professionals (the Practitioner), to provide me with telemedicine health care services (the Services") as deemed necessary by the treating Practitioner. I acknowledge and consent to receive the Services by the Practitioner via telemedicine. I understand that the telemedicine visit will involve communicating with the Practitioner through live audiovisual communication technology and the disclosure of certain medical information by electronic transmission. I acknowledge that I have been given the opportunity to request an in-person assessment or other available alternative prior to the telemedicine visit and am voluntarily participating in the telemedicine visit.  I understand that I have the right to withhold or withdraw my consent to the use of telemedicine in the course of my care at any time, without affecting my right to future care or treatment, and that the Practitioner or I may terminate the telemedicine visit at  any time. I understand that I have the right to inspect all information obtained and/or recorded in the course of the telemedicine visit and may receive copies of available information for a reasonable fee.  I understand that some of the potential risks of receiving the Services via telemedicine include:   Delay or  interruption in medical evaluation due to technological equipment failure or disruption;  Information transmitted may not be sufficient (e.g. poor resolution of images) to allow for appropriate medical decision making by the Practitioner; and/or   In rare instances, security protocols could fail, causing a breach of personal health information.  Furthermore, I acknowledge that it is my responsibility to provide information about my medical history, conditions and care that is complete and accurate to the best of my ability. I acknowledge that Practitioner's advice, recommendations, and/or decision may be based on factors not within their control, such as incomplete or inaccurate data provided by me or distortions of diagnostic images or specimens that may result from electronic transmissions. I understand that the practice of medicine is not an exact science and that Practitioner makes no warranties or guarantees regarding treatment outcomes. I acknowledge that I will receive a copy of this consent concurrently upon execution via email to the email address I last provided but may also request a printed copy by calling the office of [facility].    I understand that my insurance will be billed for this visit.   I have read or had this consent read to me.  I understand the contents of this consent, which adequately explains the benefits and risks of the Services being provided via telemedicine.   I have been provided ample opportunity to ask questions regarding this consent and the Services and have had my questions answered to my satisfaction.  I give my informed consent for the services to be provided through the use of telemedicine in my medical care  By participating in this telemedicine visit I agree to the above.

## 2018-09-11 NOTE — Addendum Note (Signed)
Addended by: Alvis Lemmings C on: 09/11/2018 05:18 PM   Modules accepted: Orders

## 2018-09-11 NOTE — Telephone Encounter (Signed)
Attempted to contact patient. Left message at 10:14 for patient to contact the office to schedule a Telephone visit or virtual visit.

## 2018-09-11 NOTE — Patient Instructions (Signed)
Medication Instructions:  - Restart eliquis 5 mg- take 1 tablet by mouth twice daily  - We will send in a 1 time refill on your depakote & paxil any further refills will need to come from your PCP  If you need a refill on your cardiac medications before your next appointment, please call your pharmacy.   Lab work: - none ordered  If you have labs (blood work) drawn today and your tests are completely normal, you will receive your results only by: Marland Kitchen MyChart Message (if you have MyChart) OR . A paper copy in the mail If you have any lab test that is abnormal or we need to change your treatment, we will call you to review the results.  Testing/Procedures: - none ordered  Follow-Up: At [facility], you and your health needs are our priority.  As part of our continuing mission to provide you with exceptional heart care, we have created designated Provider Care Teams.  These Care Teams include your primary Cardiologist (physician) and Advanced Practice Providers (APPs -  Physician Assistants and Nurse Practitioners) who all work together to provide you with the care you need, when you need it. . 1 month with Dr. [name]- VIRTUAL TELEPHONE VISIT  Any Other Special Instructions Will Be Listed Below (If Applicable). - N/A

## 2018-09-11 NOTE — Progress Notes (Signed)
Electrophysiology TeleHealth Note   Due to national recommendations of social distancing due to COVID 19, an audio/video telehealth visit is felt to be most appropriate for this patient at this time.  See MyChart message from today for the patient's consent to telehealth for [facility].   Date:  09/11/2018   ID:  Lori Orr, DOB 04/11/1951, MRN [id number]  Location: patient's home  Provider location: [address], [place]  Evaluation Performed: Follow-up visit  PCP:  Mikey College, NP  Cardiologist:  Virl Axe, MD  Electrophysiologist:  None   Chief Complaint:  PACEMAKER AND ATRIAL FIB  History of Present Illness:    Lori Orr is a 68 y.o. female who presents via audio conferencing for a telehealth visit today.  Since last being seen in our clinic, the patient reports doing very well.  Today, she denies symptoms of palpitations, chest pain, shortness of breath,  lower extremity edema, dizziness, presyncope, or syncope.  The patient is otherwise without complaint today x concerned that she cannot get her psychotropic medications filled because of problems establishing with psychiatry .    Hx of SCAF and subrsequent stroke, previously implanted pacemaker 2012 ([employer])  Echo>> normal LV function with concentric LVH and cavity obliteration and myoview normal at that time  Holding up-- talking about her bipolar and was put back on [employer] and has run out of her psychotropic meds   The patient denies chest pain , shortness of breath* *, nocturnal dyspnea , orthopnea * or peripheral edema* .  There have been no palpitations,, lightheadedness * or syncope* .    Date Cr K Hgb  3/20 0.81       Nephrology "stopped" apixoban  GI   The patient denies symptoms of fevers, chills, cough, or new SOB worrisome for COVID 19. *   Past Medical History:  Diagnosis Date  . Bradycardia   . Cancer ([place])    ovarian cancer  oophrectomy w/ only "1/4 ovary  remaining"  . CHF (congestive heart failure) ([place])   . Chronic kidney disease   . Depression   . Hypertension   . Pancreatitis   . SSS (sick sinus syndrome) ([place])    PPM implant 03/08/11 [facility] [employer] [id number] - SN [id number]    Past Surgical History:  Procedure Laterality Date  . ABDOMINAL HYSTERECTOMY    . APPENDECTOMY    . PACEMAKER IMPLANT  03/08/2011   [employer] [id number] [phone]) - Implanted For SSS    Current Outpatient Medications  Medication Sig Dispense Refill  . atorvastatin (LIPITOR) 40 MG tablet Take 1 tablet (40 mg total) by mouth daily. 90 tablet 1  . divalproex (DEPAKOTE) 250 MG DR tablet Take 1 tablet (250 mg total) by mouth 3 (three) times daily. 90 tablet 0  . lisinopril (PRINIVIL,ZESTRIL) 40 MG tablet Take 1 tablet (40 mg total) by mouth daily. 90 tablet 1  . metoprolol (TOPROL-XL) 200 MG 24 hr tablet Take 1 tablet (200 mg total) by mouth daily. 90 tablet 1   No current facility-administered medications for this visit.     Allergies:   Sulfa antibiotics; Acetaminophen; and Trazodone and nefazodone   Social History:  The patient  reports that she has been smoking cigarettes. She has a 5.00 pack-year smoking history. She has never used smokeless tobacco. She reports that she does not drink alcohol or use drugs.   Family History:  The patient's   family history includes Dementia in her mother;  Fibromyalgia in her daughter; Heart attack in her father; Heart disease in her sister and sister.   ROS:  Please see the history of present illness.   All other systems are personally reviewed and negative.    Exam:    Vital Signs:  There were no vitals taken for this visit.       Labs/Other Tests and Data Reviewed:    Recent Labs: 11/01/2017: ALT 13; Hemoglobin 12.6; Platelets 164; Potassium 3.8; Sodium 136 08/20/2018: BUN 25; Creatinine, Ser 0.81   Wt Readings from Last 3 Encounters:  08/22/18 119 lb (54 kg)  08/13/18 116 lb (52.6 kg)  08/02/18 113 lb (51.3  kg)     Other studies personally reviewed:  pt does not have remote device access Device interrogation 6/19 was reviewed  Longevity > 5 yrs Ap 91% Vs 96%     ASSESSMENT & PLAN:    Syncope  Pacemaker-[employer]  Hypertension  Dyspnea on exertion  COPD  Chest pain  Atrial fibrillation-subclinical  TIA/CVA   No interval syncope  She was started at last visit on apixoban stopped inadvertently   No bleeding issues  Will resume  Euvolemic continue current meds  Will Rx depakote and Paxil x 1 month have reached out to her pcp to carry the ball until post COVID     COVID 19 screen The patient denies symptoms of COVID 19 at this time.  The importance of social distancing was discussed today.  Follow-up:  3 months Next remote: *   Current medicines are reviewed at length with the patient today.   The patient has concerns regarding her medicines.  The following changes were made today:   RESUME apixoban  RX DEPAKOTE AND PAXIL X ONE MONTH As above    Labs/ tests ordered today include:   No orders of the defined types were placed in this encounter.   Future tests ( post COVID )  CBC AND BMET* in3   months  Patient Risk:  after full review of this patients clinical status, I feel that they are at moderate risk at this time.  Today, I have spent 27 minutes with the patient with telehealth technology discussing As above  .    Signed, Virl Axe, MD  09/11/2018 2:16 PM     [place] [id number] [phone] (office)

## 2018-09-12 NOTE — Telephone Encounter (Signed)
Telehealth visit 3/31

## 2018-09-13 ENCOUNTER — Other Ambulatory Visit: Payer: Self-pay | Admitting: Nurse Practitioner

## 2018-09-13 DIAGNOSIS — F5104 Psychophysiologic insomnia: Secondary | ICD-10-CM

## 2018-09-13 DIAGNOSIS — F3162 Bipolar disorder, current episode mixed, moderate: Secondary | ICD-10-CM

## 2018-09-13 MED ORDER — PAROXETINE HCL 20 MG PO TABS: 20.0000 mg | ORAL_TABLET | Freq: Every day | ORAL | 1 refills | Status: DC

## 2018-09-18 IMAGING — CT CT ABD-PELV W/ CM
2 of 5 series · 14 of 46 positions shown, 16 images · IV contrast (APPLIED)
Comparison: January 27, 2017

CLINICAL DATA: Left lower quadrant pain. Patient states she has
pancreatitis.

EXAM:
CT ABDOMEN AND PELVIS WITH CONTRAST
TECHNIQUE: Multidetector CT imaging of the abdomen and pelvis was performed
using the standard protocol following bolus administration of
intravenous contrast.
CONTRAST:  75mL 1MFIA2-366 IOPAMIDOL (1MFIA2-366) INJECTION 61%

[Series 2: axial st · axial · 0.60mm/px · z∈[-939,-504]mm · 11 of 97 slices shown, 13 images]
[im 5/97  soft-tissue]
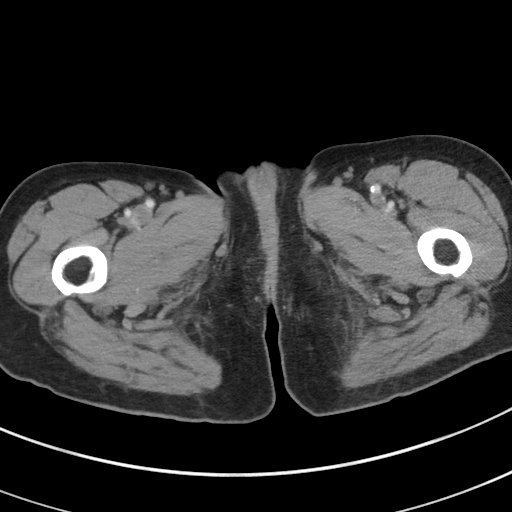
[im 5/97  bone]
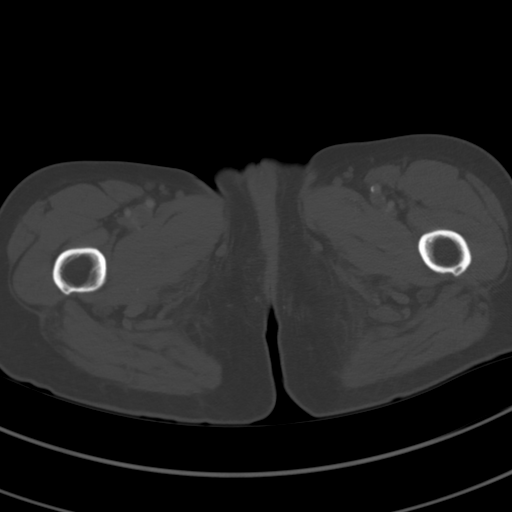
[im 15/97  soft-tissue]
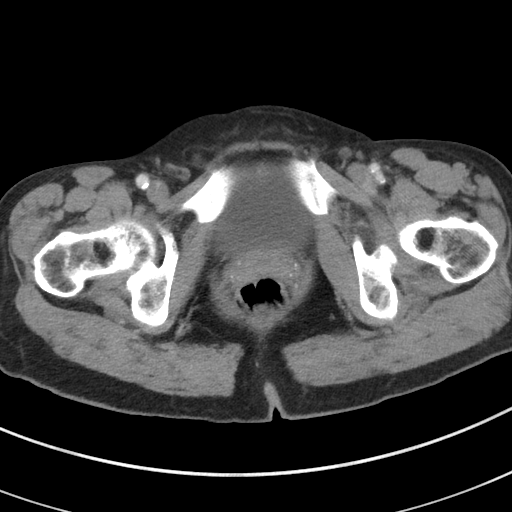
[im 25/97  soft-tissue]
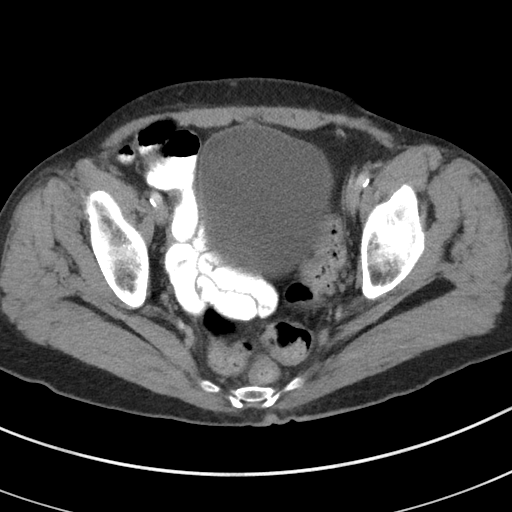
[im 34/97  soft-tissue]
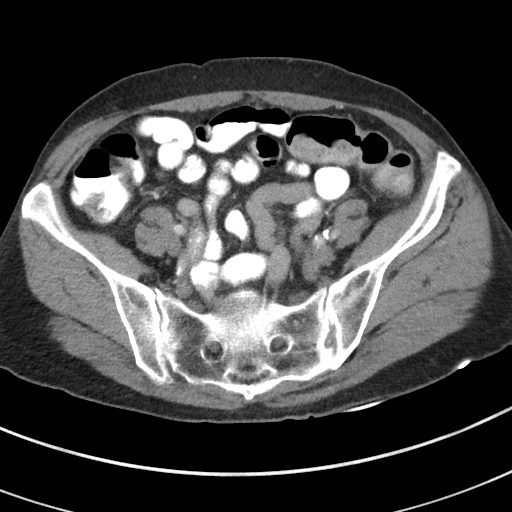
[im 39/97  soft-tissue]
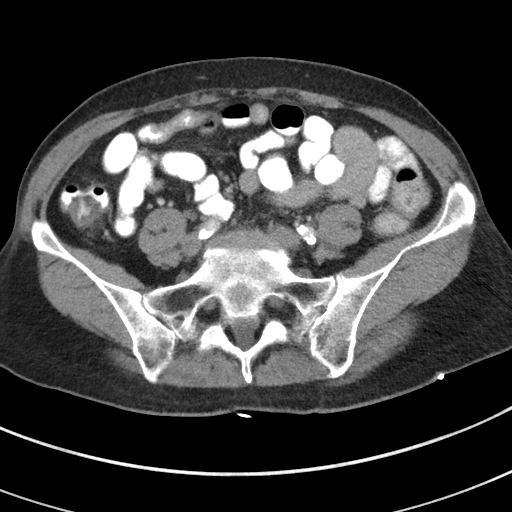
[im 49/97  soft-tissue]
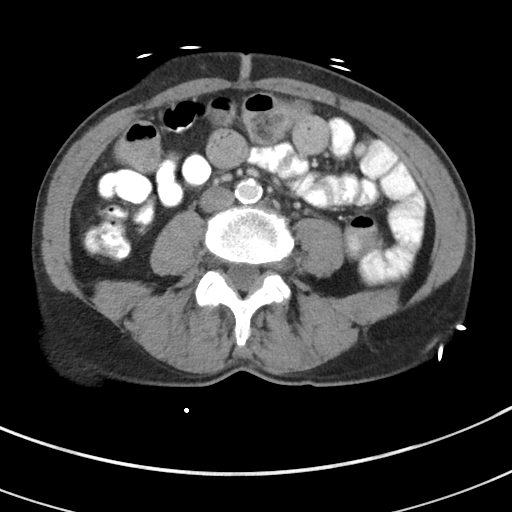
[im 58/97  soft-tissue]
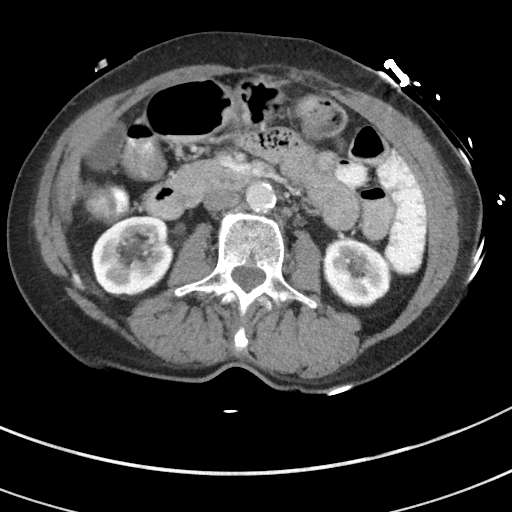
[im 63/97  soft-tissue]
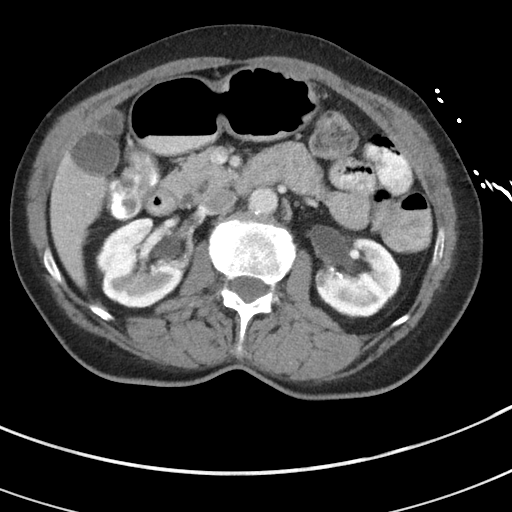
[im 73/97  soft-tissue]
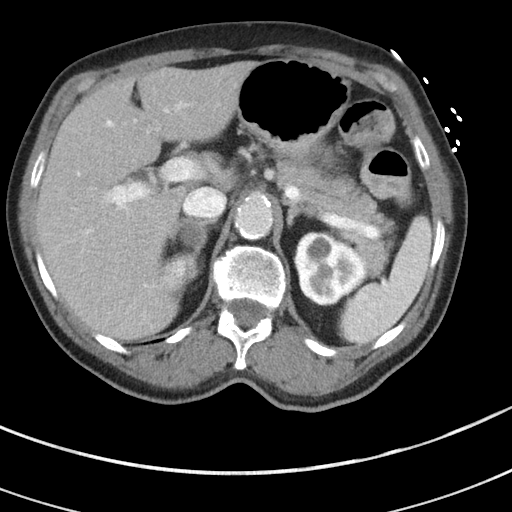
[im 73/97  bone]
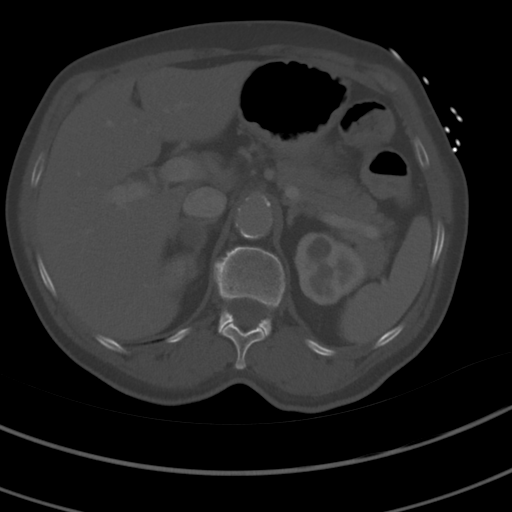
[im 82/97  soft-tissue]
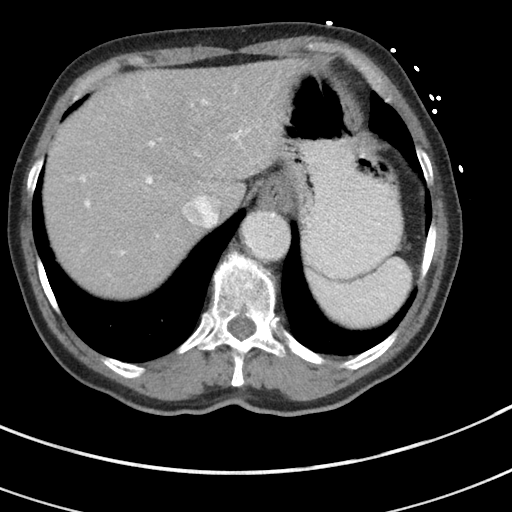
[im 92/97  soft-tissue]
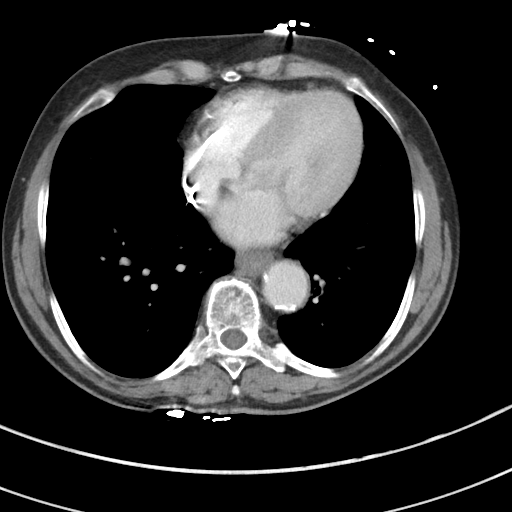

[Series 5: coronal st · coronal · 0.66mm/px · 3 of 79 slices shown]
[im 27/79  soft-tissue]
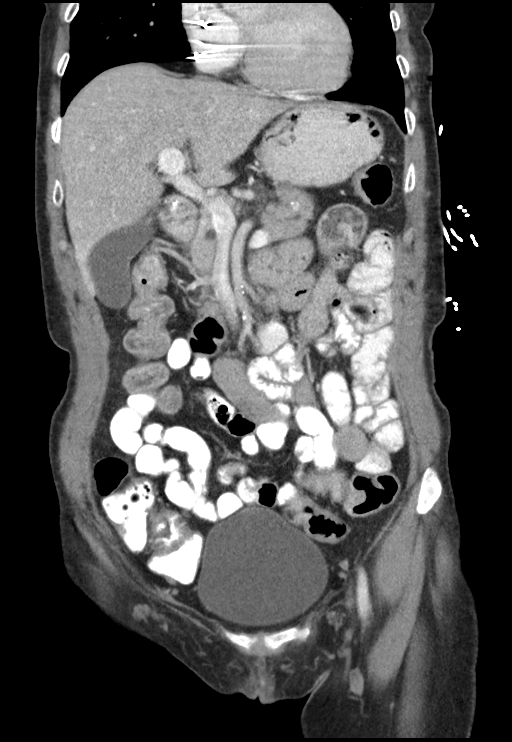
[im 35/79  soft-tissue]
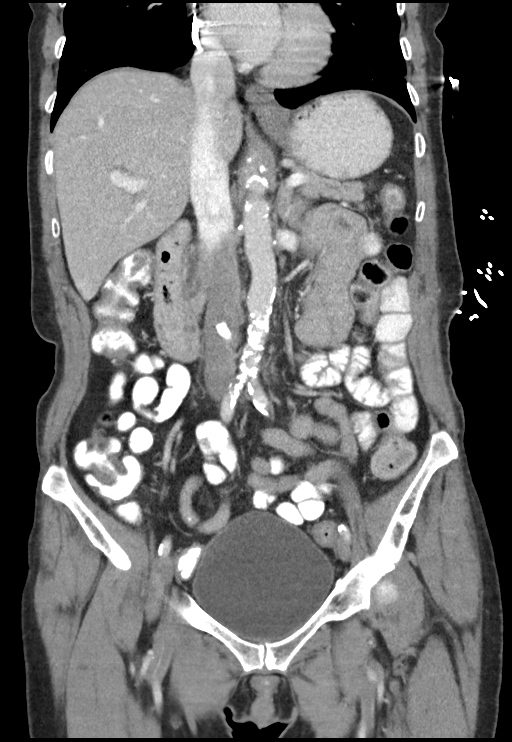
[im 44/79  soft-tissue]
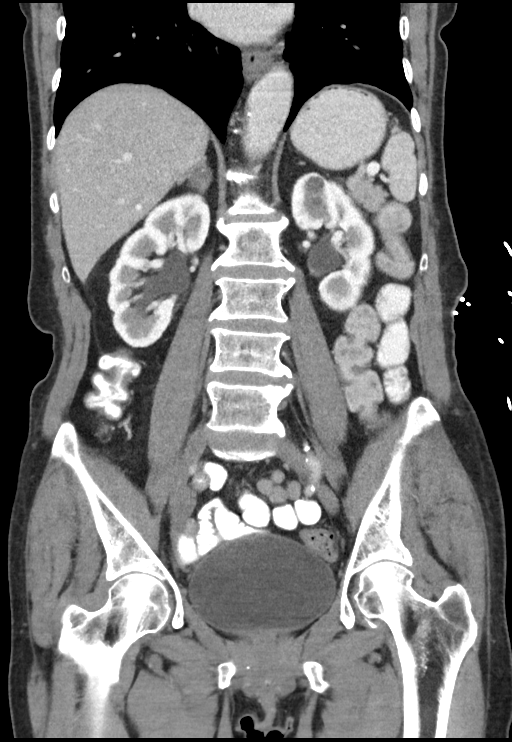

[14 of 46 positions shown; findings below may reference images not displayed]

FINDINGS: Lower chest: A nodule abutting the right hemidiaphragm on series 4,
image 9 measures 5 mm, unchanged. Possible nodule in the left lung
base measuring 4 mm, not included on the previous study. No acute
abnormalities identified in the lung bases. Cardiomegaly is noted.

Hepatobiliary: Hepatic steatosis. The liver, gallbladder, and portal
vein are otherwise normal.

Pancreas: Dilatation of the pancreatic duct, primarily within the
head, neck, and proximal body of the pancreas is more pronounced in
the interval measuring up to 6 mm. There is a stone within or
adjacent to the pancreatic duct of the tail on coronal image 25
which is stable. There is a calcification in the head of the
pancreas on coronal image 31 either within or adjacent to the distal
pancreatic duct.

Spleen: Normal in size without focal abnormality.

Adrenals/Urinary Tract: The nodule in the right adrenal gland
measures 18 mm on both studies with an attenuation of 45 Hounsfield
units. The left adrenal gland is normal. A cortical calcification is
seen in the left kidney. Vascular calcifications are associated with
the left greater than right kidney. There is a stone in the lower
pole left kidney. A tiny probable cyst is seen in the right kidney.
Multiple cysts are seen on the left. There is a lesion on series 2,
image 31 which is stable. It is indeterminate however with an
attenuation of greater than 20 Hounsfield units. Both kidneys
demonstrate an extrarenal pelvis with no acute hydronephrosis or
perinephric stranding. The ureters are normal in appearance. The
bladder is unremarkable.

Stomach/Bowel: The stomach and small bowel are normal. Scattered
colonic diverticula are identified without diverticulitis. Patient
is status post appendectomy.

Vascular/Lymphatic: Atherosclerotic changes are seen in the
nonaneurysmal aorta, iliac vessels common femoral vessels. No
adenopathy.

Reproductive: Status post hysterectomy. No adnexal masses.

Other: No free air or free fluid.

Musculoskeletal: Degenerative changes are seen in the lumbar spine.
IMPRESSION: 1. No evidence of acute pancreatitis. The pancreatic duct is dilated
however extending from the distal body into the head. This
dilatation has worsened in the interval. There are 2 tiny
calcifications either in or immediately adjacent to the pancreatic
duct which are stable. No definitive cause for the increasing
caliber of the pancreatic duct in the interval.
2. Stable 5 mm nodule in the right lung base. No follow-up needed if
patient is low-risk. Non-contrast chest CT can be considered in 12
months if patient is high-risk. This recommendation follows the
consensus statement: Guidelines for Management of Incidental
Pulmonary Nodules Detected on CT Images: From the [HOSPITAL]
3. Indeterminate left renal lesion described on previous CT scan is
stable. Recommend MRI for further characterization as previously
recommended.
4. Stable right adrenal nodule. Recommend attention to this nodule
on the MRI to assess the renal lesion.
Aortic Atherosclerosis (NOJDQ-WBD.D).

## 2018-09-19 ENCOUNTER — Telehealth: Payer: Self-pay | Admitting: Internal Medicine

## 2018-09-19 NOTE — Telephone Encounter (Signed)
-----   Message from Lori Filbert, RN sent at 09/18/2018  4:49 PM EDT ----- Per 3/31 e-visit w/ SK- the patient needs a 2 month Telephone visit with him- please call to schedule   Thanks!

## 2018-09-19 NOTE — Telephone Encounter (Signed)
lmov x 2 to schedule fu Evisit .  Ok per previous msg from [place] to [place].

## 2018-09-21 NOTE — Telephone Encounter (Signed)
Schedule 4-30 ok to overbook

## 2018-09-26 ENCOUNTER — Telehealth: Payer: Self-pay

## 2018-09-26 NOTE — Telephone Encounter (Signed)
LMTC E-Visit 12:00 10/11/2018.

## 2018-10-08 ENCOUNTER — Other Ambulatory Visit: Payer: Self-pay

## 2018-10-08 ENCOUNTER — Other Ambulatory Visit: Payer: Self-pay | Admitting: Family Medicine

## 2018-10-08 DIAGNOSIS — F3162 Bipolar disorder, current episode mixed, moderate: Secondary | ICD-10-CM

## 2018-10-08 MED ORDER — DIVALPROEX SODIUM 250 MG PO DR TAB: 250.0000 mg | DELAYED_RELEASE_TABLET | Freq: Three times a day (TID) | ORAL | 0 refills | Status: DC

## 2018-10-10 ENCOUNTER — Other Ambulatory Visit: Payer: Self-pay

## 2018-10-10 MED ORDER — APIXABAN 5 MG PO TABS: 5.0000 mg | ORAL_TABLET | Freq: Two times a day (BID) | ORAL | 5 refills | Status: DC

## 2018-10-10 NOTE — Telephone Encounter (Signed)
*  STAT* If patient is at the pharmacy, call can be transferred to refill team.   1. Which medications need to be refilled? (please list name of each medication and dose if known) eliquis and not sure of her other medications for her heart, but she needs though also  2. Which pharmacy/location (including street and city if local pharmacy) is medication to be sent to?[facility] [place]  3. Do they need a 30 day or 90 day supply? 30 day

## 2018-10-10 NOTE — Telephone Encounter (Signed)
Refill Request.  

## 2018-10-10 NOTE — Telephone Encounter (Signed)
Pt last saw Dr [name] 09/11/18 telemedicine COVID-19, last labs 08/20/18 Creat 0.81, age 68, weight 52.6kg, based on specified criteria pt is on appropriate dosage of Eliquis 5mg  BID.  Will refill rx.

## 2018-10-11 ENCOUNTER — Other Ambulatory Visit: Payer: Self-pay

## 2018-10-11 ENCOUNTER — Encounter: Payer: Self-pay | Admitting: Internal Medicine

## 2018-10-11 ENCOUNTER — Telehealth (INDEPENDENT_AMBULATORY_CARE_PROVIDER_SITE_OTHER): Payer: Medicare Other | Admitting: Internal Medicine

## 2018-10-11 VITALS — Ht 62.0 in | Wt 121.0 lb

## 2018-10-11 DIAGNOSIS — I480 Paroxysmal atrial fibrillation: Secondary | ICD-10-CM

## 2018-10-11 NOTE — Patient Instructions (Signed)
Medication Instructions:  - Your physician recommends that you continue on your current medications as directed. Please refer to the Current Medication list given to you today.  If you need a refill on your cardiac medications before your next appointment, please call your pharmacy.   Lab work: - none ordered  If you have labs (blood work) drawn today and your tests are completely normal, you will receive your results only by: Marland Kitchen MyChart Message (if you have MyChart) OR . A paper copy in the mail If you have any lab test that is abnormal or we need to change your treatment, we will call you to review the results.  Testing/Procedures: - none ordered  Follow-Up: At [facility], you and your health needs are our priority.  As part of our continuing mission to provide you with exceptional heart care, we have created designated Provider Care Teams.  These Care Teams include your primary Cardiologist (physician) and Advanced Practice Providers (APPs -  Physician Assistants and Nurse Practitioners) who all work together to provide you with the care you need, when you need it.  You will need a follow up appointment in 3 months with Dr. [name]. Please call our office 2 months in advance to schedule this appointment.  (call in late May/ early June to schedule)  Any Other Special Instructions Will Be Listed Below (If Applicable). - Please try to obtain a home blood pressure monitor

## 2018-10-11 NOTE — Progress Notes (Signed)
Electrophysiology TeleHealth Note   Due to national recommendations of social distancing due to COVID 19, an audio/video telehealth visit is felt to be most appropriate for this patient at this time.  See MyChart message from today for the patient's consent to telehealth for [facility]   Date:  10/11/2018   ID:  [name], DOB 1951-02-20, MRN [id number]  Location: patient's home  Provider location: [address] [place]  Evaluation Performed: Follow-up visit  PCP:  Mikey College, NP  Cardiologist:  Virl Axe, MD  Electrophysiologist:  None   Chief Complaint:  PACEMAKER AND ATRIAL FIB  History of Present Illness:    Lori Orr is a 68 y.o. female who presents via audio conferencing for a telehealth visit today.  Since last being seen in our clinic, the patient reports  Doing well  Hx of  stroke, previously implanted pacemaker 2012 ([facility])  On anticoagulation without bleeding   Echo>> normal LV function with concentric LVH and cavity obliteration and myoview normal at that time    The patient denies chest pain, shortness of breath, nocturnal dyspnea, orthopnea or peripheral edema.  There have been no palpitations, lightheadedness or syncope.      Date Cr K Hgb  3/20 0.81         The patient denies symptoms of fevers, chills, cough, or new SOB worrisome for COVID 19. *   Past Medical History:  Diagnosis Date  . Bradycardia   . Cancer ([place])    ovarian cancer  oophrectomy w/ only "1/4 ovary remaining"  . CHF (congestive heart failure) ([place])   . Chronic kidney disease   . Depression   . Hypertension   . Pancreatitis   . SSS (sick sinus syndrome) ([place])    PPM implant 03/08/11 [facility] [employer] [id number] - SN [id number]    Past Surgical History:  Procedure Laterality Date  . ABDOMINAL HYSTERECTOMY    . APPENDECTOMY    . PACEMAKER IMPLANT  03/08/2011   [employer] 5606 [phone]) - Implanted For SSS    Current Outpatient  Medications  Medication Sig Dispense Refill  . apixaban (ELIQUIS) 5 MG TABS tablet Take 1 tablet (5 mg total) by mouth 2 (two) times daily. 60 tablet 5  . atorvastatin (LIPITOR) 40 MG tablet Take 1 tablet (40 mg total) by mouth daily. 90 tablet 1  . divalproex (DEPAKOTE) 250 MG DR tablet Take 1 tablet (250 mg total) by mouth 3 (three) times daily. 90 tablet 0  . lisinopril (PRINIVIL,ZESTRIL) 40 MG tablet Take 1 tablet (40 mg total) by mouth daily. 90 tablet 1  . metoprolol (TOPROL-XL) 200 MG 24 hr tablet Take 1 tablet (200 mg total) by mouth daily. 90 tablet 1  . PARoxetine (PAXIL) 20 MG tablet Take 1 tablet (20 mg total) by mouth daily. 90 tablet 1   No current facility-administered medications for this visit.     Allergies:   Sulfa antibiotics; Acetaminophen; and Trazodone and nefazodone   Social History:  The patient  reports that she has been smoking cigarettes. She has a 5.00 pack-year smoking history. She has never used smokeless tobacco. She reports that she does not drink alcohol or use drugs.   Family History:  The patient's   family history includes Dementia in her mother; Fibromyalgia in her daughter; Heart attack in her father; Heart disease in her sister and sister.   ROS:  Please see the history of present illness.   All other  systems are personally reviewed and negative.    Exam:    Vital Signs:  There were no vitals taken for this visit.       Labs/Other Tests and Data Reviewed:    Recent Labs: 11/01/2017: ALT 13; Hemoglobin 12.6; Platelets 164; Potassium 3.8; Sodium 136 08/20/2018: BUN 25; Creatinine, Ser 0.81   Wt Readings from Last 3 Encounters:  08/22/18 119 lb (54 kg)  08/13/18 116 lb (52.6 kg)  08/02/18 113 lb (51.3 kg)     Other studies personally reviewed:  pt does not have remote device access Device interrogation 6/19 was reviewed  Longevity > 5 yrs Ap 91% Vs 96%     ASSESSMENT & PLAN:    Syncope  Pacemaker-[employer]  Hypertension   Dyspnea on exertion  COPD  Atrial fibrillation-subclinical  TIA/CVA   By her report, euvolemic  Continue current mdes  No syncope  No symptomatic arrhythmia  BP -- recommended a cuff   Able to get psychotropic meds     COVID 19 screen The patient denies symptoms of COVID 19 at this time.  The importance of social distancing was discussed today.  Follow-up:  3 months Next remote: *   Current medicines are reviewed at length with the patient today.        Labs/ tests ordered today include:   No orders of the defined types were placed in this encounter.     Patient Risk:  after full review of this patients clinical status, I feel that they are at moderate risk at this time.  Today, I have spent 27 minutes with the patient with telehealth technology discussing As above  .    Signed, Virl Axe, MD  10/11/2018 11:38 AM     [address] [place] [place] [address] [phone] (office)

## 2018-10-22 ENCOUNTER — Ambulatory Visit (INDEPENDENT_AMBULATORY_CARE_PROVIDER_SITE_OTHER): Payer: Medicare Other | Admitting: Psychiatry

## 2018-10-22 ENCOUNTER — Other Ambulatory Visit: Payer: Self-pay

## 2018-10-22 ENCOUNTER — Encounter: Payer: Self-pay | Admitting: Psychiatry

## 2018-10-22 DIAGNOSIS — F3162 Bipolar disorder, current episode mixed, moderate: Secondary | ICD-10-CM

## 2018-10-22 DIAGNOSIS — F17200 Nicotine dependence, unspecified, uncomplicated: Secondary | ICD-10-CM | POA: Diagnosis not present

## 2018-10-22 MED ORDER — DIVALPROEX SODIUM 250 MG PO DR TAB: 250.0000 mg | DELAYED_RELEASE_TABLET | Freq: Three times a day (TID) | ORAL | 0 refills | Status: AC

## 2018-10-22 NOTE — Progress Notes (Signed)
Virtual Visit via Video Note  I connected with Lori Orr on 10/22/18 at  1:00 PM EDT by a video enabled telemedicine application and verified that I am speaking with the correct person using two identifiers.   I discussed the limitations of evaluation and management by telemedicine and the availability of in person appointments. The patient expressed understanding and agreed to proceed.    I discussed the assessment and treatment plan with the patient. The patient was provided an opportunity to ask questions and all were answered. The patient agreed with the plan and demonstrated an understanding of the instructions.   The patient was advised to call back or seek an in-person evaluation if the symptoms worsen or if the condition fails to improve as anticipated.    Psychiatric Initial Adult Assessment   Patient Identification: Lori Orr MRN:  [id number] Date of Evaluation:  10/22/2018 Referral Source: Cassell Smiles NP Chief Complaint:   Chief Complaint    Establish Care; Anxiety; Depression; Stress; Post-Traumatic Stress Disorder     Visit Diagnosis:    ICD-10-CM   1. Bipolar disorder, current episode mixed, moderate (HCC) F31.62 divalproex (DEPAKOTE) 250 MG DR tablet  2. Tobacco use disorder F17.200     History of Present Illness:  Lori Orr is a 68 yr old Caucasian female, on SSI, lives in [place], has a history of bipolar disorder, atrial fibrillation on cardiac pacemaker, history of TIA, history of pancreatitis, history of renal stones, hypertension was evaluated by telemedicine today.  Patient today reports she has been struggling with bipolar disorder all her life.  She reports she used to live in [place] before.  She came to [place] few months ago.  She came here to be with her daughter and grandchildren.  Patient reports a history of having mood swings.  She reports she has mixed episodes of bipolar usually.  She describes sadness, crying spells, irritability, mood  swings, hypomanic episodes and manic episodes occurring at same time.  Patient currently takes Depakote.  She reports her symptoms are currently under control on the Depakote.  She does not remember when was the last time she had a Depakote level.  Discussed with patient about going to [facility] to get Depakote levels done and she agrees with plan.  Patient reports a history of smoking.  She reports she has been unable to quit smoking.  Provided smoking cessation counseling.  She is willing to try.  She denies any significant anxiety symptoms.  She denies any history of trauma.  She denies any suicidality, homicidality or perceptual disturbances.  Patient reports her daughter as well as grandchildren are supportive.  Collateral information was obtained from granddaughter-destiny.  Per destiny patient is currently doing well on the current medication regimen.    Associated Signs/Symptoms: Depression Symptoms:  depressed mood, anhedonia, insomnia, difficulty concentrating, hopelessness, (Hypo) Manic Symptoms:  Distractibility, Elevated Mood, Impulsivity, Irritable Mood, Labiality of Mood, Anxiety Symptoms:  Denies Psychotic Symptoms:  denies PTSD Symptoms: Negative  Past Psychiatric History: She reports a previous diagnosis of bipolar disorder.  Patient denies inpatient mental health admissions in [place] previously.  Patient denies any suicide attempts.  Previous Psychotropic Medications: Yes Paxil, Depakote, trazodone  Substance Abuse History in the last 12 months:  No.  Consequences of Substance Abuse: Negative  Past Medical History:  Past Medical History:  Diagnosis Date  . Bradycardia   . Cancer ([place])    ovarian cancer  oophrectomy w/ only "1/4 ovary remaining"  . CHF (congestive heart failure) ([place])   .  Chronic kidney disease   . Depression   . Hypertension   . Pancreatitis   . PTSD (post-traumatic stress disorder)   . SSS (sick sinus syndrome) ([place])    PPM  implant 03/08/11 [facility] [employer] 5606 - SN [id number]    Past Surgical History:  Procedure Laterality Date  . ABDOMINAL HYSTERECTOMY    . APPENDECTOMY    . PACEMAKER IMPLANT  03/08/2011   [employer] 5606 [phone]) - Implanted For SSS    Family Psychiatric History: Patient reports that her granddaughter has bipolar disorder.  Family History:  Family History  Problem Relation Age of Onset  . Dementia Mother   . Heart attack Father        Died age 63  . Heart disease Sister        Congential  . Heart disease Sister        "Some kind of heart surgery."  . Fibromyalgia Daughter   . Cancer Brother   . Cancer Brother     Social History:   Social History   Socioeconomic History  . Marital status: Divorced    Spouse name: Not on file  . Number of children: 1  . Years of education: Not on file  . Highest education level: High school graduate  Occupational History  . 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  Social Needs  . Financial resource strain: Somewhat hard  . Food insecurity:    Worry: Often true    Inability: Often true  . Transportation needs:    Medical: Yes    Non-medical: No  Tobacco Use  . Smoking status: Current Every Day Smoker    Packs/day: 0.50    Years: 50.00    Pack years: 25.00    Types: Cigarettes  . Smokeless tobacco: Never Used  . Tobacco comment: one pack every 3 days  Substance and Sexual Activity  . Alcohol use: No  . Drug use: No  . Sexual activity: Not Currently  Lifestyle  . Physical activity:    Days per week: 0 days    Minutes per session: 0 min  . Stress: Not at all  Relationships  . Social connections:    Talks on phone: Never    Gets together: Never    Attends religious service: Never    Active member of club or organization: No    Attends meetings of clubs or organizations: Never    Relationship status: Divorced  Other Topics Concern  . Not on file  Social History Narrative    Moving in with her daughter.     Additional Social History: Patient is divorced.  She currently lives in [place] with her daughter and grandchildren.  She is on SSI.  She moved here from [place] to be with her daughter.  She reports a normal childhood.  She went up to 11th grade. Allergies:   Allergies  Allergen Reactions  . Sulfa Antibiotics Rash and Hives  . Acetaminophen   . Trazodone And Nefazodone Other (See Comments)     Severe nightmares    Metabolic Disorder Labs: No results found for: HGBA1C, MPG No results found for: PROLACTIN Lab Results  Component Value Date   CHOL 222 (H) 08/16/2017   TRIG 240 (H) 08/16/2017   HDL 48 08/16/2017   CHOLHDL 4.6 08/16/2017   VLDL 48 (H) 08/16/2017   LDLCALC 126 (H) 08/16/2017   No results found for: TSH  Therapeutic Level Labs: No results found for:  LITHIUM No results found for: CBMZ No results found for: VALPROATE  Current Medications: Current Outpatient Medications  Medication Sig Dispense Refill  . divalproex (DEPAKOTE) 250 MG DR tablet Take 1 tablet (250 mg total) by mouth 3 (three) times daily. mood 270 tablet 0  . lisinopril (PRINIVIL,ZESTRIL) 40 MG tablet Take 1 tablet (40 mg total) by mouth daily. 90 tablet 1  . PARoxetine (PAXIL) 20 MG tablet Take 1 tablet (20 mg total) by mouth daily. 90 tablet 1  . apixaban (ELIQUIS) 5 MG TABS tablet Take 1 tablet (5 mg total) by mouth 2 (two) times daily. (Patient not taking: Reported on 10/22/2018) 60 tablet 5  . atorvastatin (LIPITOR) 40 MG tablet Take 1 tablet (40 mg total) by mouth daily. 90 tablet 1  . metoprolol (TOPROL-XL) 200 MG 24 hr tablet Take 1 tablet (200 mg total) by mouth daily. (Patient not taking: Reported on 10/22/2018) 90 tablet 1   No current facility-administered medications for this visit.     Musculoskeletal: Strength & Muscle Tone: within normal limits Gait & Station: normal Patient leans: N/A  Psychiatric Specialty Exam: Review of Systems   Psychiatric/Behavioral: The patient is nervous/anxious.   All other systems reviewed and are negative.   There were no vitals taken for this visit.There is no height or weight on file to calculate BMI.  General Appearance: Casual  Eye Contact:  Fair  Speech:  Clear and Coherent  Volume:  Normal  Mood:  Anxious  Affect:  Appropriate  Thought Process:  Goal Directed and Descriptions of Associations: Intact  Orientation:  Full (Time, Place, and Person)  Thought Content:  Logical  Suicidal Thoughts:  No  Homicidal Thoughts:  No  Memory:  Immediate;   Fair Recent;   Fair Remote;   Fair  Judgement:  Fair  Insight:  Fair  Psychomotor Activity:  Normal  Concentration:  Concentration: Fair and Attention Span: Fair  Recall:  [employer] of Knowledge:Fair  Language: Fair  Akathisia:  No  Handed:  Right  AIMS (if indicated): Denies tremors, rigidity, stiffness  Assets:  Communication Skills Desire for Improvement Resilience Social Support  ADL's:  Intact  Cognition: WNL  Sleep:  Fair   Screenings: GAD-7     Office Visit from 08/13/2018 in [facility]  Total GAD-7 Score  8    PHQ2-9     Office Visit from 08/13/2018 in [facility] Office Visit from 07/09/2018 in [facility] Office Visit from 04/17/2017 in [facility]  PHQ-2 Total Score  6  3  5   PHQ-9 Total Score  12  6  14       Assessment and Plan: Renae is a 68 year old Caucasian female who is divorced, lives in [place] , recently moved from [place], has a history of bipolar disorder, hypertension, atrial fibrillation on cardiac pacemaker, renal stones, chronic pancreatitis was evaluated by telemedicine today.  Patient is biologically predisposed given her family history.  She also has psychosocial stressors of the recent move, the COVID-19 outbreak.  Patient however is currently stable on the current medication regimen.  Plan as noted below.  Plan Bipolar disorder-  stable Continue Depakote 250 mg p.o. 3 times daily Will order Depakote labs-she will go to [facility].  Will mail her lab slip to her today. Continue Paxil 20 mg p.o. daily.  For tobacco use disorder unstable Provided smoking cessation counseling.  Order the following labs-Depakote level, CMP, CBC, TSH.  I have obtained collateral  information from her granddaughter-destiny as summarized above.  Follow-up in clinic in 1 month or sooner if needed.  Appointment scheduled for June 16 at 4 PM.  I have spent atleast 60 minutes non face to face with patient today. More than 50 % of the time was spent for psychoeducation and supportive psychotherapy and care coordination.  This note was generated in part or whole with voice recognition software. Voice recognition is usually quite accurate but there are transcription errors that can and very often do occur. I apologize for any typographical errors that were not detected and corrected.          Ursula Alert, MD 5/11/20201:24 PM

## 2018-10-30 ENCOUNTER — Telehealth: Payer: Self-pay | Admitting: Internal Medicine

## 2018-10-30 NOTE — Telephone Encounter (Signed)
Returned the pt call. Pt sts that her boyfriend is currently admitted @ [facility] ICU. She would like Dr.Klein to see the pt. Adv the pt of the inpatient protocol. Adv her to contact [facility] ask to speak with the nurse taking care of the pt. Adv the pt that she can discuss with the nurse whether or not a Cardiology consult is needed. Adv her then cardiology can round on him if needed.  Adv her that establishing care outpatient does not require a ref. They can call and schedule new pt appt with [facility], previous cardiology records should be provided to our office.   Pt voiced appreciation for the information.

## 2018-10-30 NOTE — Telephone Encounter (Signed)
New Message:    Patient calling concerning her boyfriend and would like for him to see Dr. [name]. Patient is in ICU  in [place]. I'm really not sure what all she is asking for.

## 2018-11-03 ENCOUNTER — Other Ambulatory Visit: Payer: Self-pay | Admitting: Internal Medicine

## 2018-11-03 DIAGNOSIS — F5104 Psychophysiologic insomnia: Secondary | ICD-10-CM

## 2018-11-03 DIAGNOSIS — F3162 Bipolar disorder, current episode mixed, moderate: Secondary | ICD-10-CM

## 2018-11-04 ENCOUNTER — Encounter: Payer: Self-pay | Admitting: Emergency Medicine

## 2018-11-04 ENCOUNTER — Other Ambulatory Visit: Payer: Self-pay

## 2018-11-04 ENCOUNTER — Ambulatory Visit
Admission: EM | Admit: 2018-11-04 | Discharge: 2018-11-04 | Disposition: A | Discharge status: Home / Self Care | Payer: Medicare Other | Attending: Family Medicine | Admitting: Family Medicine

## 2018-11-04 DIAGNOSIS — S39012A Strain of muscle, fascia and tendon of lower back, initial encounter: Secondary | ICD-10-CM | POA: Diagnosis not present

## 2018-11-04 DIAGNOSIS — I10 Essential (primary) hypertension: Secondary | ICD-10-CM

## 2018-11-04 MED ORDER — CYCLOBENZAPRINE HCL 10 MG PO TABS: 10.0000 mg | ORAL_TABLET | Freq: Three times a day (TID) | ORAL | 0 refills | Status: DC | PRN

## 2018-11-04 MED ORDER — MELOXICAM 15 MG PO TABS: 15.0000 mg | ORAL_TABLET | Freq: Every day | ORAL | 0 refills | Status: DC

## 2018-11-04 MED ORDER — HYDROCODONE-ACETAMINOPHEN 5-325 MG PO TABS: ORAL_TABLET | ORAL | 0 refills | Status: DC

## 2018-11-04 NOTE — Discharge Instructions (Addendum)
Rest, heat 

## 2018-11-04 NOTE — ED Triage Notes (Signed)
Patient states she injured her lower back 4 days ago when she bent over and picked up something heavy. Patient c/o low back pain.

## 2018-11-04 NOTE — ED Provider Notes (Signed)
[facility]    CSN: [id number] Arrival date & time: 11/04/18  1134     History   Chief Complaint Chief Complaint  Patient presents with  . Back Pain    HPI Lori Orr is a 68 y.o. female.   68 yo female with a c/o low back pain for the past 4 days after injuring it while bending over to lift a heavy tote. Denies pain radiating down her legs, bowel or bladder problems.   Back Pain    Past Medical History:  Diagnosis Date  . Bradycardia   . Cancer ([place])    ovarian cancer  oophrectomy w/ only "1/4 ovary remaining"  . CHF (congestive heart failure) ([place])   . Chronic kidney disease   . Depression   . Hypertension   . Pancreatitis   . PTSD (post-traumatic stress disorder)   . SSS (sick sinus syndrome) ([place])    PPM implant 03/08/11 - [employer] [phone] - SN [id number]    Patient Active Problem List   Diagnosis Date Noted  . Abscess, gluteal, left   . TIA (transient ischemic attack) 08/15/2017  . Psychophysiological insomnia 05/19/2017  . Bipolar disorder, current episode mixed, moderate ([place]) 04/17/2017  . Recurrent pancreatitis 04/17/2017  . Ready to quit smoking 04/17/2017  . Atypical chest pain 02/25/2017  . Abnormal EKG 02/25/2017  . Cardiac pacemaker in situ 03/29/2016  . History of malignant neoplasm of cervix 03/29/2016  . Hypertensive disorder 03/29/2016  . Kidney stone 03/29/2016  . Multiple nodules of lung 03/29/2016  . Acute pyelonephritis 07/26/2014    Past Surgical History:  Procedure Laterality Date  . ABDOMINAL HYSTERECTOMY    . APPENDECTOMY    . PACEMAKER IMPLANT  03/08/2011   [employer] [id number] [phone]) - Implanted For SSS    OB History   No obstetric history on file.      Home Medications    Prior to Admission medications   Medication Sig Start Date End Date Taking? Authorizing Provider  divalproex (DEPAKOTE) 250 MG DR tablet Take 1 tablet (250 mg total) by mouth 3 (three) times daily. mood 10/22/18  Yes  Eappen, Ria Clock, MD  lisinopril (PRINIVIL,ZESTRIL) 40 MG tablet Take 1 tablet (40 mg total) by mouth daily. 07/17/18  Yes Mikey College, NP  metoprolol (TOPROL-XL) 200 MG 24 hr tablet Take 1 tablet (200 mg total) by mouth daily. 07/17/18  Yes Mikey College, NP  PARoxetine (PAXIL) 20 MG tablet Take 1 tablet (20 mg total) by mouth daily. 09/13/18  Yes Mikey College, NP  apixaban (ELIQUIS) 5 MG TABS tablet Take 1 tablet (5 mg total) by mouth 2 (two) times daily. 10/10/18   Deboraha Sprang, MD  atorvastatin (LIPITOR) 40 MG tablet Take 1 tablet (40 mg total) by mouth daily. 07/17/18 10/15/18  Mikey College, NP  cyclobenzaprine (FLEXERIL) 10 MG tablet Take 1 tablet (10 mg total) by mouth 3 (three) times daily as needed for muscle spasms. 11/04/18   Norval Gable, MD  HYDROcodone-acetaminophen (NORCO/VICODIN) 5-325 MG tablet 1-2 tabs po bid prn 11/04/18   Norval Gable, MD  meloxicam (MOBIC) 15 MG tablet Take 1 tablet (15 mg total) by mouth daily. 11/04/18   Norval Gable, MD    Family History Family History  Problem Relation Age of Onset  . Dementia Mother   . Heart attack Father        Died age 74  . Heart disease Sister        Congential  .  Heart disease Sister        "Some kind of heart surgery."  . Fibromyalgia Daughter   . Cancer Brother   . Cancer Brother     Social History Social History   Tobacco Use  . Smoking status: Current Every Day Smoker    Packs/day: 0.50    Years: 50.00    Pack years: 25.00    Types: Cigarettes  . Smokeless tobacco: Never Used  . Tobacco comment: one pack every 3 days  Substance Use Topics  . Alcohol use: No  . Drug use: No     Allergies   Sulfa antibiotics; Acetaminophen; Tramadol; and Trazodone and nefazodone   Review of Systems Review of Systems  Musculoskeletal: Positive for back pain.     Physical Exam Triage Vital Signs ED Triage Vitals  Enc Vitals Group     BP 11/04/18 1212 (!) 157/82     Pulse Rate  11/04/18 1212 60     Resp 11/04/18 1212 18     Temp 11/04/18 1212 98.1 F (36.7 C)     Temp Source 11/04/18 1212 Oral     SpO2 11/04/18 1212 99 %     Weight 11/04/18 1213 121 lb (54.9 kg)     Height 11/04/18 1213 5\' 2"  (1.575 m)     Head Circumference --      Peak Flow --      Pain Score 11/04/18 1213 10     Pain Loc --      Pain Edu? --      Excl. in [place]? --    No data found.  Updated Vital Signs BP (!) 157/82 (BP Location: Right Arm)   Pulse 60   Temp 98.1 F (36.7 C) (Oral)   Resp 18   Ht 5\' 2"  (1.575 m)   Wt 54.9 kg   SpO2 99%   BMI 22.13 kg/m   Visual Acuity Right Eye Distance:   Left Eye Distance:   Bilateral Distance:    Right Eye Near:   Left Eye Near:    Bilateral Near:     Physical Exam Vitals signs and nursing note reviewed.  Constitutional:      General: She is not in acute distress.    Appearance: She is not toxic-appearing or diaphoretic.  Musculoskeletal:     Lumbar back: She exhibits tenderness (over the lumbar paraspinous muscles bilaterally) and spasm. She exhibits no bony tenderness.  Neurological:     Mental Status: She is alert.      UC Treatments / Results  Labs (all labs ordered are listed, but only abnormal results are displayed) Labs Reviewed - No data to display  EKG None  Radiology No results found.  Procedures Procedures (including critical care time)  Medications Ordered in UC Medications - No data to display  Initial Impression / Assessment and Plan / UC Course  I have reviewed the triage vital signs and the nursing notes.  Pertinent labs & imaging results that were available during my care of the patient were reviewed by me and considered in my medical decision making (see chart for details).      Final Clinical Impressions(s) / UC Diagnoses   Final diagnoses:  Strain of lumbar region, initial encounter     Discharge Instructions     Rest, heat    ED Prescriptions    Medication Sig Dispense Auth.  Provider   cyclobenzaprine (FLEXERIL) 10 MG tablet Take 1 tablet (10 mg total) by mouth 3 (three)  times daily as needed for muscle spasms. 30 tablet Norval Gable, MD   meloxicam (MOBIC) 15 MG tablet Take 1 tablet (15 mg total) by mouth daily. 30 tablet Norval Gable, MD   HYDROcodone-acetaminophen (NORCO/VICODIN) 5-325 MG tablet 1-2 tabs po bid prn 6 tablet Norval Gable, MD     1. diagnosis reviewed with patient 2. rx as per orders above; reviewed possible side effects, interactions, risks and benefits  3. Recommend supportive treatment as above 4. Follow-up prn if symptoms worsen or don't improve  Controlled Substance Prescriptions [place] Controlled Substance Registry consulted? Not Applicable   Norval Gable, MD 11/04/18 [phone]

## 2018-11-10 ENCOUNTER — Ambulatory Visit
Admission: EM | Admit: 2018-11-10 | Discharge: 2018-11-10 | Disposition: A | Discharge status: Home / Self Care | Payer: Medicare Other

## 2018-11-10 ENCOUNTER — Encounter: Payer: Self-pay | Admitting: Emergency Medicine

## 2018-11-10 ENCOUNTER — Other Ambulatory Visit: Payer: Self-pay

## 2018-11-10 NOTE — ED Triage Notes (Signed)
Pt c/o lower back pain. She states that she was lifting heavy objects. She was seen last week for this and is not any better. She states that muscle relaxer that she was given makes her not remember anything.

## 2018-11-14 ENCOUNTER — Other Ambulatory Visit: Payer: Self-pay | Admitting: *Deleted

## 2018-11-14 DIAGNOSIS — F5104 Psychophysiologic insomnia: Secondary | ICD-10-CM

## 2018-11-14 DIAGNOSIS — F3162 Bipolar disorder, current episode mixed, moderate: Secondary | ICD-10-CM

## 2018-11-14 MED ORDER — PAROXETINE HCL 20 MG PO TABS: 20.0000 mg | ORAL_TABLET | Freq: Every day | ORAL | 2 refills | Status: AC

## 2018-11-15 ENCOUNTER — Ambulatory Visit (INDEPENDENT_AMBULATORY_CARE_PROVIDER_SITE_OTHER): Payer: Medicare Other

## 2018-11-15 ENCOUNTER — Ambulatory Visit
Admission: EM | Admit: 2018-11-15 | Discharge: 2018-11-15 | Disposition: A | Discharge status: Home / Self Care | Payer: Medicare Other | Attending: Emergency Medicine | Admitting: Emergency Medicine

## 2018-11-15 ENCOUNTER — Other Ambulatory Visit: Payer: Self-pay

## 2018-11-15 DIAGNOSIS — M545 Low back pain, unspecified: Secondary | ICD-10-CM

## 2018-11-15 DIAGNOSIS — S300XXA Contusion of lower back and pelvis, initial encounter: Secondary | ICD-10-CM | POA: Diagnosis not present

## 2018-11-15 MED ORDER — HYDROCODONE-ACETAMINOPHEN 5-325 MG PO TABS: 1.0000 | ORAL_TABLET | Freq: Three times a day (TID) | ORAL | 0 refills | Status: DC | PRN

## 2018-11-15 MED ORDER — PREDNISONE 20 MG PO TABS: 40.0000 mg | ORAL_TABLET | Freq: Every day | ORAL | 0 refills | Status: DC

## 2018-11-15 NOTE — ED Triage Notes (Signed)
C/o lower back pain. She states that she injured her back lifting heavy boxes about 2 weeks ago and she fell yesterday and injured her back again.

## 2018-11-15 NOTE — ED Provider Notes (Addendum)
[facility] ____________________________________________  Time seen: Approximately 12:49 PM  I have reviewed the triage vital signs and the nursing notes.   HISTORY  Chief Complaint Fall and Back Pain  HPI Lori Orr is a 68 y.o. female presenting for evaluation of low back pain.  Patient reports approximately 2 weeks ago she was seen for the same after lifting heavy totes.  Patient states that she took the anti-inflammatory and the pain medicine which helps some, but reports the muscle relaxer was too much for her and made her drowsy and was unable to take.  Patient reports her back pain had somewhat started to improve.  States yesterday her son left a toolbox in the floor, and she did not see this causing her to trip.  States she is fell back on her low back.  States pain increased since.  Denies head injury or other injuries.  Has continue remain ambulatory.  Has not been taking any other over-the-counter medications for the same complaints.  Denies pain radiation, paresthesias, urinary or bowel retention or incontinence.  Continues eat and drink well.  Denies other complaints.  Mikey College, NP: PCP   Past Medical History:  Diagnosis Date  . Bradycardia   . Cancer ([place])    ovarian cancer  oophrectomy w/ only "1/4 ovary remaining"  . CHF (congestive heart failure) ([place])   . Chronic kidney disease   . Depression   . Hypertension   . Pancreatitis   . PTSD (post-traumatic stress disorder)   . SSS (sick sinus syndrome) ([place])    PPM implant 03/08/11 - [employer] [phone] - SN [id number]    Patient Active Problem List   Diagnosis Date Noted  . Abscess, gluteal, left   . TIA (transient ischemic attack) 08/15/2017  . Psychophysiological insomnia 05/19/2017  . Bipolar disorder, current episode mixed, moderate ([place]) 04/17/2017  . Recurrent pancreatitis 04/17/2017  . Ready to quit smoking 04/17/2017  . Atypical chest pain 02/25/2017  . Abnormal EKG 02/25/2017   . Cardiac pacemaker in situ 03/29/2016  . History of malignant neoplasm of cervix 03/29/2016  . Hypertensive disorder 03/29/2016  . Kidney stone 03/29/2016  . Multiple nodules of lung 03/29/2016  . Acute pyelonephritis 07/26/2014    Past Surgical History:  Procedure Laterality Date  . ABDOMINAL HYSTERECTOMY    . APPENDECTOMY    . PACEMAKER IMPLANT  03/08/2011   [employer] [id number] [phone]) - Implanted For SSS     No current facility-administered medications for this encounter.   Current Outpatient Medications:  .  apixaban (ELIQUIS) 5 MG TABS tablet, Take 1 tablet (5 mg total) by mouth 2 (two) times daily., Disp: 60 tablet, Rfl: 5 .  atorvastatin (LIPITOR) 40 MG tablet, Take 1 tablet (40 mg total) by mouth daily., Disp: 90 tablet, Rfl: 1 .  cyclobenzaprine (FLEXERIL) 10 MG tablet, Take 1 tablet (10 mg total) by mouth 3 (three) times daily as needed for muscle spasms., Disp: 30 tablet, Rfl: 0 .  divalproex (DEPAKOTE) 250 MG DR tablet, Take 1 tablet (250 mg total) by mouth 3 (three) times daily. mood, Disp: 270 tablet, Rfl: 0 .  HYDROcodone-acetaminophen (NORCO/VICODIN) 5-325 MG tablet, 1-2 tabs po bid prn, Disp: 6 tablet, Rfl: 0 .  HYDROcodone-acetaminophen (NORCO/VICODIN) 5-325 MG tablet, Take 1 tablet by mouth every 8 (eight) hours as needed for moderate pain or severe pain (do not drive or operate machinery while taking as can cause drowsiness)., Disp: 3 tablet, Rfl: 0 .  lisinopril (PRINIVIL,ZESTRIL) 40  MG tablet, Take 1 tablet (40 mg total) by mouth daily., Disp: 90 tablet, Rfl: 1 .  meloxicam (MOBIC) 15 MG tablet, Take 1 tablet (15 mg total) by mouth daily., Disp: 30 tablet, Rfl: 0 .  metoprolol (TOPROL-XL) 200 MG 24 hr tablet, Take 1 tablet (200 mg total) by mouth daily., Disp: 90 tablet, Rfl: 1 .  PARoxetine (PAXIL) 20 MG tablet, Take 1 tablet (20 mg total) by mouth daily., Disp: 90 tablet, Rfl: 2 .  predniSONE (DELTASONE) 20 MG tablet, Take 2 tablets (40 mg total) by  mouth daily., Disp: 10 tablet, Rfl: 0  Allergies Sulfa antibiotics; Tramadol; and Trazodone and nefazodone  Family History  Problem Relation Age of Onset  . Dementia Mother   . Heart attack Father        Died age 61  . Heart disease Sister        Congential  . Heart disease Sister        "Some kind of heart surgery."  . Fibromyalgia Daughter   . Cancer Brother   . Cancer Brother     Social History Social History   Tobacco Use  . Smoking status: Current Every Day Smoker    Packs/day: 0.50    Years: 50.00    Pack years: 25.00    Types: Cigarettes  . Smokeless tobacco: Never Used  . Tobacco comment: one pack every 3 days  Substance Use Topics  . Alcohol use: No  . Drug use: No    Review of Systems Constitutional: No fever Cardiovascular: Denies chest pain. Respiratory: Denies shortness of breath. Gastrointestinal: No abdominal pain.  No nausea, no vomiting.  No diarrhea.  No constipation. Genitourinary: Negative for dysuria. Musculoskeletal: Positive for back pain. Skin: Negative for rash. Neurological: Negative for headaches, focal weakness or numbness.   ____________________________________________   PHYSICAL EXAM:  VITAL SIGNS: ED Triage Vitals [11/15/18 1248]  Enc Vitals Group     BP      Pulse      Resp      Temp      Temp src      SpO2      Weight      Height      Head Circumference      Peak Flow      Pain Score 10     Pain Loc      Pain Edu?      Excl. in [place]?    Vitals:   11/15/18 1249  BP: 116/69  Pulse: 65  Resp: 20  Temp: 98.1 F (36.7 C)  TempSrc: Oral  SpO2: 98%     Constitutional: Alert and oriented. Well appearing and in no acute distress. ENT      Head: Normocephalic and atraumatic. Cardiovascular: Normal rate, regular rhythm. Grossly normal heart sounds.  Good peripheral circulation. Respiratory: Normal respiratory effort without tachypnea nor retractions. Breath sounds are clear and equal bilaterally. No wheezes,  rales, rhonchi. Gastrointestinal: Soft and nontender. No CVA tenderness. Musculoskeletal: No midline cervical or thoracic tenderness to palpation.  Bilateral pedal pulses equal and easily palpated. Hips nontender.    Positive midline lower lumbar and paralumbar tenderness to palpation as well as midline lower sacral tenderness, no ecchymosis, skin intact, pain increases with flexion and extension, minimal pain with right and left lumbar rotation, no pain with standing bilateral knee lifts, no saddle anesthesia.  Steady gait.  Changes positions quickly. Neurologic:  Normal speech and language. No gross focal neurologic deficits are appreciated. Speech  is normal. No gait instability.  Skin:  Skin is warm, dry and intact. No rash noted. Psychiatric: Mood and affect are normal. Speech and behavior are normal. Patient exhibits appropriate insight and judgment   ___________________________________________   LABS (all labs ordered are listed, but only abnormal results are displayed)  Labs Reviewed - No data to display ____________________________________________  RADIOLOGY  Dg Lumbar Spine Complete  Result Date: 11/15/2018 CLINICAL DATA:  Pain post fall EXAM: LUMBAR SPINE - COMPLETE 4+ VIEW COMPARISON:  None CT abdomen and pelvis 08/20/2018 FINDINGS: Osseous demineralization. Five non-rib-bearing lumbar vertebra. Minimal levoconvex scoliosis of thoracolumbar spine apex L2. Facet degenerative changes lower lumbar spine. Mild scattered endplate spur formation at lower thoracic and lumbar spine, though disc space heights are fairly well maintained. No fracture, subluxation, or bone destruction. No spondylolysis. SI joints preserved. Scattered atherosclerotic calcifications of aorta and iliac arteries. IMPRESSION: Degenerative disc and facet disease changes of lumbar spine with minimal levoconvex scoliosis. No acute abnormalities. Electronically Signed   By: Lavonia Dana M.D.   On: 11/15/2018 13:28   Dg  Sacrum/coccyx  Result Date: 11/15/2018 CLINICAL DATA:  Recent fall with tailbone pain, initial encounter EXAM: SACRUM AND COCCYX - 2+ VIEW COMPARISON:  None. FINDINGS: There is no evidence of fracture or other focal bone lesions. IMPRESSION: No acute abnormality noted. Electronically Signed   By: Inez Catalina M.D.   On: 11/15/2018 13:31   ____________________________________________   PROCEDURES Procedures     INITIAL IMPRESSION / ASSESSMENT AND PLAN / ED COURSE  Pertinent labs & imaging results that were available during my care of the patient were reviewed by me and considered in my medical decision making (see chart for details).  Well-appearing patient.  No acute distress.  Present for evaluation of low back pain.  No focal neurological deficit.  Will evaluate lumbar and sacral coccyx x-ray due to fall yesterday.  Denies other injury.  X-rays as above per radiologist, no acute abnormalities, degenerative changes.  Discussed these findings with patient.  Will treat patient with oral prednisone and Vicodin, quantity 3.  Encouraged ice, rest, support and stretching.  Follow-up with primary care closely.Discussed indication, risks and benefits of medications with patient.  Discussed follow up with Primary care physician this week. Discussed follow up and return parameters including no resolution or any worsening concerns. Patient verbalized understanding and agreed to plan.   [place] controlled substance database reviewed: Most recent see below 11/04/2018  1   11/04/2018  Hydrocodone-Acetamin 5-325 MG  6.00 1 Or Con   [id number]   [facility] ([id number])   0  30.00 MME  Comm Ins     11/13/2017  1   11/01/2017  Tramadol Hcl 50 MG Tablet  15.00 4 Un Pha            ____________________________________________   FINAL CLINICAL IMPRESSION(S) / ED DIAGNOSES  Final diagnoses:  Acute bilateral low back pain without sciatica  Contusion of sacrum, initial encounter     ED Discharge Orders         Ordered     predniSONE (DELTASONE) 20 MG tablet  Daily     11/15/18 1341    HYDROcodone-acetaminophen (NORCO/VICODIN) 5-325 MG tablet  Every 8 hours PRN     11/15/18 1341           Note: This dictation was prepared with Dragon dictation along with smaller phrase technology. Any transcriptional errors that result from this process are unintentional.  Marylene Land, NP 11/15/18 1409    Marylene Land, NP 11/15/18 1410

## 2018-11-15 NOTE — Discharge Instructions (Addendum)
Take medication as prescribed. Rest. Drink plenty of fluids. Ice.   Follow up with your primary care physician this week. Return to Urgent care for new or worsening concerns.

## 2018-11-16 ENCOUNTER — Telehealth: Payer: Self-pay | Admitting: Family Medicine

## 2018-11-16 ENCOUNTER — Other Ambulatory Visit: Payer: Self-pay

## 2018-11-16 DIAGNOSIS — I10 Essential (primary) hypertension: Secondary | ICD-10-CM

## 2018-11-16 MED ORDER — LISINOPRIL 40 MG PO TABS: 40.0000 mg | ORAL_TABLET | Freq: Every day | ORAL | 1 refills | Status: AC

## 2018-11-16 NOTE — Telephone Encounter (Signed)
Covering inbox for Cassell Smiles, AGPCNP-BC while she is out of office.  Responding to refill request faxed from [facility]  Amlodipine - chart shows that she has been off since Feb or March 2020.  Dicyclomine - do not see an active rx.  Lisinopril - I can go ahead and refill this one.  The other rx request was for Depakote - this is managed by her Psychiatry Dr Shea Evans. She ordered it on 10/22/18 to local [facility] in [place], not to [facility], it looks like we would need to notify Dr Charlcie Cradle office that patient needs rx sent to [facility] instead.  Nobie Putnam, [facility] 11/16/2018, 4:29 PM

## 2018-11-19 NOTE — Telephone Encounter (Addendum)
Attempted to contact the pt and her phone number is disconnected. I will send a my chart message to notify the patient.

## 2018-11-27 ENCOUNTER — Ambulatory Visit: Payer: Medicare Other | Admitting: Psychiatry

## 2018-11-27 ENCOUNTER — Other Ambulatory Visit: Payer: Self-pay

## 2018-11-27 NOTE — Progress Notes (Signed)
Patient stated she wanted to reschedule- will call back

## 2018-12-12 ENCOUNTER — Emergency Department
Admission: EM | Admit: 2018-12-12 | Discharge: 2018-12-12 | Disposition: A | Discharge status: Home / Self Care | Payer: Medicare Other | Attending: Student in an Organized Health Care Education/Training Program | Admitting: Student in an Organized Health Care Education/Training Program

## 2018-12-12 ENCOUNTER — Telehealth: Payer: Self-pay | Admitting: Internal Medicine

## 2018-12-12 ENCOUNTER — Other Ambulatory Visit: Payer: Self-pay

## 2018-12-12 ENCOUNTER — Emergency Department: Payer: Medicare Other

## 2018-12-12 DIAGNOSIS — Z8673 Personal history of transient ischemic attack (TIA), and cerebral infarction without residual deficits: Secondary | ICD-10-CM | POA: Diagnosis not present

## 2018-12-12 DIAGNOSIS — Z950 Presence of cardiac pacemaker: Secondary | ICD-10-CM | POA: Insufficient documentation

## 2018-12-12 DIAGNOSIS — R1013 Epigastric pain: Secondary | ICD-10-CM | POA: Insufficient documentation

## 2018-12-12 DIAGNOSIS — Z79899 Other long term (current) drug therapy: Secondary | ICD-10-CM | POA: Diagnosis not present

## 2018-12-12 DIAGNOSIS — I509 Heart failure, unspecified: Secondary | ICD-10-CM | POA: Diagnosis not present

## 2018-12-12 DIAGNOSIS — F17210 Nicotine dependence, cigarettes, uncomplicated: Secondary | ICD-10-CM | POA: Insufficient documentation

## 2018-12-12 DIAGNOSIS — R0789 Other chest pain: Secondary | ICD-10-CM | POA: Diagnosis not present

## 2018-12-12 DIAGNOSIS — Z7901 Long term (current) use of anticoagulants: Secondary | ICD-10-CM | POA: Diagnosis not present

## 2018-12-12 DIAGNOSIS — R079 Chest pain, unspecified: Secondary | ICD-10-CM | POA: Diagnosis present

## 2018-12-12 DIAGNOSIS — I110 Hypertensive heart disease with heart failure: Secondary | ICD-10-CM | POA: Insufficient documentation

## 2018-12-12 LAB — CBC
HCT: 38.7 % (ref 36.0–46.0)
Hemoglobin: 12.6 g/dL (ref 12.0–15.0)
MCH: 29.6 pg (ref 26.0–34.0)
MCHC: 32.6 g/dL (ref 30.0–36.0)
MCV: 91.1 fL (ref 80.0–100.0)
Platelets: 201 10*3/uL (ref 150–400)
RBC: 4.25 MIL/uL (ref 3.87–5.11)
RDW: 13.2 % (ref 11.5–15.5)
WBC: 7.1 10*3/uL (ref 4.0–10.5)
nRBC: 0 % (ref 0.0–0.2)

## 2018-12-12 LAB — BASIC METABOLIC PANEL
Anion gap: 8 (ref 5–15)
BUN: 19 mg/dL (ref 8–23)
CO2: 20 mmol/L — ABNORMAL LOW (ref 22–32)
Calcium: 9 mg/dL (ref 8.9–10.3)
Chloride: 110 mmol/L (ref 98–111)
Creatinine, Ser: 0.91 mg/dL (ref 0.44–1.00)
GFR calc Af Amer: >60 mL/min (ref >60)
GFR calc non Af Amer: >60 mL/min (ref >60)
Glucose, Bld: 103 mg/dL — ABNORMAL HIGH (ref 70–99)
Potassium: 3.7 mmol/L (ref 3.5–5.1)
Sodium: 138 mmol/L (ref 135–145)

## 2018-12-12 LAB — HEPATIC FUNCTION PANEL
ALT: 17 U/L (ref 0–44)
AST: 23 U/L (ref 15–41)
Albumin: 3.7 g/dL (ref 3.5–5.0)
Alkaline Phosphatase: 61 U/L (ref 38–126)
Bilirubin, Direct: <0.1 mg/dL (ref 0.0–0.2)
Total Bilirubin: 0.4 mg/dL (ref 0.3–1.2)
Total Protein: 6.2 g/dL — ABNORMAL LOW (ref 6.5–8.1)

## 2018-12-12 LAB — TROPONIN I (HIGH SENSITIVITY)
Troponin I (High Sensitivity): 4 ng/L (ref <18)
Troponin I (High Sensitivity): 4 ng/L (ref <18)

## 2018-12-12 LAB — LIPASE, BLOOD: Lipase: 64 U/L — ABNORMAL HIGH (ref 11–51)

## 2018-12-12 MED ORDER — LIDOCAINE VISCOUS HCL 2 % MT SOLN
15.0000 mL | Freq: Once | OROMUCOSAL | Status: AC
  Administered 2018-12-12: 15 mL via ORAL
  Filled 2018-12-12: qty 15

## 2018-12-12 MED ORDER — HYDROCODONE-ACETAMINOPHEN 5-325 MG PO TABS
1.0000 | ORAL_TABLET | Freq: Once | ORAL | Status: AC
  Administered 2018-12-12: 1 via ORAL
  Filled 2018-12-12: qty 1

## 2018-12-12 MED ORDER — SODIUM CHLORIDE 0.9% FLUSH: 3.0000 mL | Freq: Once | INTRAVENOUS | Status: DC

## 2018-12-12 MED ORDER — HYDROCODONE-ACETAMINOPHEN 5-325 MG PO TABS: 1.0000 | ORAL_TABLET | Freq: Three times a day (TID) | ORAL | 0 refills | Status: DC | PRN

## 2018-12-12 MED ORDER — ALUM & MAG HYDROXIDE-SIMETH 200-200-20 MG/5ML PO SUSP
30.0000 mL | Freq: Once | ORAL | Status: AC
  Administered 2018-12-12: 30 mL via ORAL
  Filled 2018-12-12: qty 30

## 2018-12-12 MED ORDER — FENTANYL CITRATE (PF) 100 MCG/2ML IJ SOLN
25.0000 ug | Freq: Once | INTRAMUSCULAR | Status: AC
  Administered 2018-12-12: 25 ug via INTRAVENOUS
  Filled 2018-12-12: qty 2

## 2018-12-12 NOTE — ED Notes (Signed)
Dr. Ellender Hose informed of pain 10/10 chest pain, verbal order given for fentanyl IV

## 2018-12-12 NOTE — ED Notes (Signed)
Light green top tube sent to lab for repeat trop

## 2018-12-12 NOTE — Telephone Encounter (Signed)
Pt c/o of Chest Pain: STAT if CP now or developed within 24 hours  1. Are you having CP right now? Yes   2. Are you experiencing any other symptoms (ex. SOB, nausea, vomiting, sweating)? No   3. How long have you been experiencing CP? 15 minutes ago   4. Is your CP continuous or coming and going? Comes and goes   5. Have you taken Nitroglycerin? None available  ?

## 2018-12-12 NOTE — ED Triage Notes (Signed)
Pt arrives via [facility] from home for left sided chest pain of 10/10 that started 1 hour ago. Pt has hx of GI ulcer, IBS, pacemaker.

## 2018-12-12 NOTE — Discharge Instructions (Addendum)

## 2018-12-12 NOTE — ED Provider Notes (Signed)
[facility] Emergency Department Provider Note    First MD Initiated Contact with Patient 12/12/18 1525     (approximate)  I have reviewed the triage vital signs and the nursing notes.   HISTORY  Chief Complaint Chest Pain    HPI Lori Orr is a 68 y.o. female below listed past medical history presents the ER for evaluation of epigastric discomfort and chest pain is been ongoing for the past several days.  Denies any exacerbating factors.  Has felt nauseated.  States it feels somewhat similar to pancreatitis.  No fevers.  Denies any chest pain at this time.  Is worried that she was having something going on with her pacemaker it has not been checked in several years.    Past Medical History:  Diagnosis Date  . Bradycardia   . Cancer ([place])    ovarian cancer  oophrectomy w/ only "1/4 ovary remaining"  . CHF (congestive heart failure) ([place])   . Chronic kidney disease   . Depression   . Hypertension   . Pancreatitis   . PTSD (post-traumatic stress disorder)   . SSS (sick sinus syndrome) ([place])    PPM implant 03/08/11 - [employer] [id number] - SN [id number]   Family History  Problem Relation Age of Onset  . Dementia Mother   . Heart attack Father        Died age 39  . Heart disease Sister        Congential  . Heart disease Sister        "Some kind of heart surgery."  . Fibromyalgia Daughter   . Cancer Brother   . Cancer Brother    Past Surgical History:  Procedure Laterality Date  . ABDOMINAL HYSTERECTOMY    . APPENDECTOMY    . PACEMAKER IMPLANT  03/08/2011   [employer] [id number] [phone]) - Implanted For SSS   Patient Active Problem List   Diagnosis Date Noted  . Abscess, gluteal, left   . TIA (transient ischemic attack) 08/15/2017  . Psychophysiological insomnia 05/19/2017  . Bipolar disorder, current episode mixed, moderate ([place]) 04/17/2017  . Recurrent pancreatitis 04/17/2017  . Ready to quit smoking 04/17/2017  . Atypical chest  pain 02/25/2017  . Abnormal EKG 02/25/2017  . Cardiac pacemaker in situ 03/29/2016  . History of malignant neoplasm of cervix 03/29/2016  . Hypertensive disorder 03/29/2016  . Kidney stone 03/29/2016  . Multiple nodules of lung 03/29/2016  . Acute pyelonephritis 07/26/2014      Prior to Admission medications   Medication Sig Start Date End Date Taking? Authorizing Provider  divalproex (DEPAKOTE) 250 MG DR tablet Take 1 tablet (250 mg total) by mouth 3 (three) times daily. mood 10/22/18  Yes Eappen, Ria Clock, MD  lisinopril (ZESTRIL) 40 MG tablet Take 1 tablet (40 mg total) by mouth daily. 11/16/18  Yes Karamalegos, Devonne Doughty, DO  metoprolol (TOPROL-XL) 200 MG 24 hr tablet Take 1 tablet (200 mg total) by mouth daily. 07/17/18  Yes Mikey College, NP  apixaban (ELIQUIS) 5 MG TABS tablet Take 1 tablet (5 mg total) by mouth 2 (two) times daily. 10/10/18   Deboraha Sprang, MD  atorvastatin (LIPITOR) 40 MG tablet Take 1 tablet (40 mg total) by mouth daily. 07/17/18 10/15/18  Mikey College, NP  cyclobenzaprine (FLEXERIL) 10 MG tablet Take 1 tablet (10 mg total) by mouth 3 (three) times daily as needed for muscle spasms. 11/04/18   Norval Gable, MD  HYDROcodone-acetaminophen (NORCO/VICODIN) 5-325 MG tablet 1-2  tabs po bid prn 11/04/18   Norval Gable, MD  HYDROcodone-acetaminophen (NORCO/VICODIN) 5-325 MG tablet Take 1 tablet by mouth every 8 (eight) hours as needed for moderate pain or severe pain (do not drive or operate machinery while taking as can cause drowsiness). 12/12/18   Merlyn Lot, MD  meloxicam (MOBIC) 15 MG tablet Take 1 tablet (15 mg total) by mouth daily. 11/04/18   Norval Gable, MD  PARoxetine (PAXIL) 20 MG tablet Take 1 tablet (20 mg total) by mouth daily. 11/14/18   Deboraha Sprang, MD  predniSONE (DELTASONE) 20 MG tablet Take 2 tablets (40 mg total) by mouth daily. 11/15/18   Marylene Land, NP    Allergies Sulfa antibiotics, Tramadol, and Trazodone and nefazodone     Social History Social History   Tobacco Use  . Smoking status: Current Every Day Smoker    Packs/day: 0.50    Years: 50.00    Pack years: 25.00    Types: Cigarettes  . Smokeless tobacco: Never Used  . Tobacco comment: one pack every 3 days  Substance Use Topics  . Alcohol use: No  . Drug use: No    Review of Systems Patient denies headaches, rhinorrhea, blurry vision, numbness, shortness of breath, chest pain, edema, cough, abdominal pain, nausea, vomiting, diarrhea, dysuria, fevers, rashes or hallucinations unless otherwise stated above in HPI. ____________________________________________   PHYSICAL EXAM:  VITAL SIGNS: Vitals:   12/12/18 1630 12/12/18 1645  BP: (!) 118/50   Pulse: 60 (!) 55  Resp: 20 19  Temp:    SpO2: 97% 98%    Constitutional: Alert and oriented.  Eyes: Conjunctivae are normal.  Head: Atraumatic. Nose: No congestion/rhinnorhea. Mouth/Throat: Mucous membranes are moist.   Neck: No stridor. Painless ROM.  Cardiovascular: Normal rate, regular rhythm. Grossly normal heart sounds.  Good peripheral circulation. Respiratory: Normal respiratory effort.  No retractions. Lungs CTAB. Gastrointestinal: Soft and nontender in all four quadrants. No distention. No abdominal bruits. No CVA tenderness. Genitourinary:  Musculoskeletal: No lower extremity tenderness nor edema.  No joint effusions. Neurologic:  Normal speech and language. No gross focal neurologic deficits are appreciated. No facial droop Skin:  Skin is warm, dry and intact. No rash noted. Psychiatric: Mood and affect are normal. Speech and behavior are normal.  ____________________________________________   LABS (all labs ordered are listed, but only abnormal results are displayed)  Results for orders placed or performed during the hospital encounter of 12/12/18 (from the past 24 hour(s))  Basic metabolic panel     Status: Abnormal   Collection Time: 12/12/18  2:41 PM  Result Value Ref  Range   Sodium 138 135 - 145 mmol/L   Potassium 3.7 3.5 - 5.1 mmol/L   Chloride 110 98 - 111 mmol/L   CO2 20 (L) 22 - 32 mmol/L   Glucose, Bld 103 (H) 70 - 99 mg/dL   BUN 19 8 - 23 mg/dL   Creatinine, Ser 0.91 0.44 - 1.00 mg/dL   Calcium 9.0 8.9 - 10.3 mg/dL   GFR calc non Af Amer >60 >60 mL/min   GFR calc Af Amer >60 >60 mL/min   Anion gap 8 5 - 15  CBC     Status: None   Collection Time: 12/12/18  2:41 PM  Result Value Ref Range   WBC 7.1 4.0 - 10.5 K/uL   RBC 4.25 3.87 - 5.11 MIL/uL   Hemoglobin 12.6 12.0 - 15.0 g/dL   HCT 38.7 36.0 - 46.0 %   MCV 91.1 80.0 -  100.0 fL   MCH 29.6 26.0 - 34.0 pg   MCHC 32.6 30.0 - 36.0 g/dL   RDW 13.2 11.5 - 15.5 %   Platelets 201 150 - 400 K/uL   nRBC 0.0 0.0 - 0.2 %  Troponin I (High Sensitivity)     Status: None   Collection Time: 12/12/18  2:41 PM  Result Value Ref Range   Troponin I (High Sensitivity) 4 <18 ng/L  Troponin I (High Sensitivity)     Status: None   Collection Time: 12/12/18  4:42 PM  Result Value Ref Range   Troponin I (High Sensitivity) 4 <18 ng/L  Hepatic function panel     Status: Abnormal   Collection Time: 12/12/18  4:42 PM  Result Value Ref Range   Total Protein 6.2 (L) 6.5 - 8.1 g/dL   Albumin 3.7 3.5 - 5.0 g/dL   AST 23 15 - 41 U/L   ALT 17 0 - 44 U/L   Alkaline Phosphatase 61 38 - 126 U/L   Total Bilirubin 0.4 0.3 - 1.2 mg/dL   Bilirubin, Direct <0.1 0.0 - 0.2 mg/dL   Indirect Bilirubin NOT CALCULATED 0.3 - 0.9 mg/dL  Lipase, blood     Status: Abnormal   Collection Time: 12/12/18  4:42 PM  Result Value Ref Range   Lipase 64 (H) 11 - 51 U/L   ____________________________________________  EKG My review and personal interpretation at Time: 14:32   Indication: chest pain  Rate: 60  Rhythm: a paced Axis: normal Other: nonspecific st abn, no stemi ____________________________________________  RADIOLOGY  I personally reviewed all radiographic images ordered to evaluate for the above acute complaints and  reviewed radiology reports and findings.  These findings were personally discussed with the patient.  Please see medical record for radiology report.  ____________________________________________   PROCEDURES  Procedure(s) performed:  Procedures    Critical Care performed: no ____________________________________________   INITIAL IMPRESSION / ASSESSMENT AND PLAN / ED COURSE  Pertinent labs & imaging results that were available during my care of the patient were reviewed by me and considered in my medical decision making (see chart for details).   DDX: Gastritis, enteritis, pancreatitis, costochondritis, ACS, dysrhythmia  Lori Orr is a 68 y.o. who presents to the ED with symptoms as described above.  Patient well-appearing in no acute distress.  Given her history will order serial enzymes and observe in the ER.  Abdominal exam is soft and benign.  Blood will be sent for the blood differential.  Clinical Course as of Dec 12 1750  Wed Dec 12, 2018  1655 Permanent pacemaker interrogation reveals no tacky dysrhythmic events with no significant change in impedance.  Appears to be appropriately functioning pacemaker.   [PR]  1727 Repeat strep is negative.  Lipase is equivocal.  Given lack of fever p or right upper quadrant tenderness with benign abdominal exam, I feel that emergent abdominal imaging clinically indicated at this time.  Patient tolerating oral hydration.  I do believe she stable and appropriate for discharge home.   [PR]    Clinical Course User Index [PR] Merlyn Lot, MD    The patient was evaluated in Emergency Department today for the symptoms described in the history of present illness. He/she was evaluated in the context of the global COVID-19 pandemic, which necessitated consideration that the patient might be at risk for infection with the SARS-CoV-2 virus that causes COVID-19. Institutional protocols and algorithms that pertain to the evaluation of  patients at risk for  COVID-19 are in a state of rapid change based on information released by regulatory bodies including the CDC and federal and state organizations. These policies and algorithms were followed during the patient's care in the ED.   As part of my medical decision making, I reviewed the following data within the [place] notes reviewed and incorporated, Labs reviewed, notes from prior ED visits and [place] Controlled Substance Database   ____________________________________________   FINAL CLINICAL IMPRESSION(S) / ED DIAGNOSES  Final diagnoses:  Atypical chest pain  Epigastric pain      NEW MEDICATIONS STARTED DURING THIS VISIT:  Current Discharge Medication List       Note:  This document was prepared using Dragon voice recognition software and may include unintentional dictation errors.    Merlyn Lot, MD 12/12/18 [phone]

## 2018-12-12 NOTE — ED Notes (Signed)
Lavender and light green top tubes sent to lab along with urine sample

## 2018-12-12 NOTE — Telephone Encounter (Signed)
thx and noted

## 2018-12-12 NOTE — Telephone Encounter (Signed)
Spoke with the pt. Pt sts that she has been having chest pain and generalized pain for several days. She was seen today by GI and is being treated for an ulcer. Adv the pt that GI dx can cause chest pain symptoms.  Pt sts that she is currently having chest pain, and pain around her PPM. Pt sts that the pain radiates to her back. Pain is a 6 or 7 on a 0-10 pain scale.  She denies n/v, diaphoresis, pre-syncope or syncope. Pt sts that she has not had her device interrogated in almost 2 years.  Adv the pt that since she is having active chest pain I recommend that she call EMS to be transported to [facility] ED for evaluation.  Pt agreeable, she will disconnect this call and call 911.

## 2018-12-18 ENCOUNTER — Other Ambulatory Visit
Admission: RE | Admit: 2018-12-18 | Discharge: 2018-12-18 | Disposition: A | Discharge status: Home / Self Care | Payer: Medicare Other | Source: Ambulatory Visit | Attending: Gastroenterology | Admitting: Gastroenterology

## 2018-12-19 ENCOUNTER — Telehealth: Payer: Self-pay | Admitting: Internal Medicine

## 2018-12-19 NOTE — Telephone Encounter (Signed)
Pt c/o of Chest Pain: STAT if CP now or developed within 24 hours  1. Are you having CP right now? No yesterday   2. Are you experiencing any other symptoms (ex. SOB, nausea, vomiting, sweating)? Unknown   3. How long have you been experiencing CP? Yesterday   4. Is your CP continuous or coming and going? Interim   5. Have you taken Nitroglycerin? No   See recent notes from [facility] and recent ED visit   Patient states she was told by ED she needs replacement ICD   Patient also wants to talk to [facility] about being out of meds and not being able to buy them   Please call patient at [facility] in [place] room 309  ?

## 2018-12-19 NOTE — Telephone Encounter (Signed)
Patient calling to check on status and is very irritated that she has not heard from anyone Please call, best number to reach is [phone]

## 2018-12-19 NOTE — Telephone Encounter (Signed)
Per [facility] Consult performed on 12/12/18 in ED (report is scanned into "Media"), pt's PPM has >5.0 years remaining on battery as of 12/12/18. Report shows available lead trends are stable, no episodes since 07/2018. Pt does not have a remote monitor.  Routed to triage for further assessment of chest pain.

## 2018-12-20 NOTE — Telephone Encounter (Signed)
Attempted to call the patient at [phone]) [phone]. No answer- I left a message to please call back.  Attempted to call [phone] RM 309 at [facility]. The person who answered the phone in the room states the patient is not in to please call back in 15 minutes.   Will attempt to call the patient back.

## 2018-12-21 ENCOUNTER — Encounter: Admission: RE | Payer: Self-pay | Source: Home / Self Care

## 2018-12-21 ENCOUNTER — Ambulatory Visit: Admission: RE | Admit: 2018-12-21 | Payer: Medicare Other | Source: Home / Self Care | Admitting: Gastroenterology

## 2018-12-21 SURGERY — ESOPHAGOGASTRODUODENOSCOPY (EGD) WITH PROPOFOL
Anesthesia: General

## 2018-12-21 NOTE — Telephone Encounter (Signed)
Attempted to call the patient back at [phone]. No answer- I left a message to please call back.  Attempted to call [phone] RM 309 at [facility]. No answer- phone reconnected to the front desk and they attempted to transfer again. No answer a 2nd time after multiple rings.

## 2018-12-25 NOTE — Telephone Encounter (Signed)
Patient returning phone call for [facility]

## 2018-12-25 NOTE — Telephone Encounter (Signed)
Patient called back with updated number [phone] ask for room 118

## 2018-12-25 NOTE — Telephone Encounter (Signed)
Attempted to call the patient at [place]- [phone]) [phone]. Per the person answering the phone, she has stepped out. He asked that I call back in 5-10 minutes.

## 2018-12-25 NOTE — Telephone Encounter (Signed)
I spoke with the patient. She states she was in the ER recently and was told her pacemaker was having to work hard to keep her from having a heart attack. She called her MD in [place] and he thought she may need to have the device changed out.  I advised that her PPM will not prevent her from having a heart attack. She is aware the Raquel Sarna, Device RN reviewed her report from the ER from 12/12/18. I have notified her of Emily's findings.  She has follow up with Dr. [name] on 01/08/19. She is aware we will check her device at that time.

## 2018-12-25 NOTE — Telephone Encounter (Signed)
Attempted to call the patient back.  Per the person answering the phone, she is still not back. I asked that he have the patient call me back, but that I would be off the floor for about 20 minutes starting now.

## 2019-01-07 ENCOUNTER — Telehealth: Payer: Self-pay | Admitting: Internal Medicine

## 2019-01-07 NOTE — Telephone Encounter (Signed)

## 2019-01-08 ENCOUNTER — Other Ambulatory Visit: Payer: Self-pay

## 2019-01-08 ENCOUNTER — Encounter: Payer: Self-pay | Admitting: Internal Medicine

## 2019-01-08 ENCOUNTER — Ambulatory Visit (INDEPENDENT_AMBULATORY_CARE_PROVIDER_SITE_OTHER): Payer: Medicare Other | Admitting: Internal Medicine

## 2019-01-08 VITALS — BP 124/60 | HR 61 | Ht 62.0 in | Wt 122.6 lb

## 2019-01-08 DIAGNOSIS — Z950 Presence of cardiac pacemaker: Secondary | ICD-10-CM | POA: Diagnosis not present

## 2019-01-08 DIAGNOSIS — I495 Sick sinus syndrome: Secondary | ICD-10-CM | POA: Diagnosis not present

## 2019-01-08 DIAGNOSIS — I480 Paroxysmal atrial fibrillation: Secondary | ICD-10-CM | POA: Diagnosis not present

## 2019-01-08 NOTE — Progress Notes (Signed)
ELECTROPHYSIOLOGY OFFICE  NOTE  Patient ID: Lori Orr, MRN: [id number], DOB/AGE: May 16, 1951 68 y.o. Admit date: (Not on file) Date of Consult: 01/08/2019  Primary Physician: Ulyess Blossom, PA Primary Cardiologist: New      HPI Lori Orr is a 68 y.o. female  Seen with previously detected afib on monitor, then classified as SCAF.  Unfortunately intercurrently with TIA/CVA  Now anticoagulation with apixoban   She has pacemaker implanted 2012 having presented w Syncope.  Seen at [facility].  Eval incl Echo LVEF normal with concentric LVH and cavity obliteration;  Tilt table normal.  Myoview-Lexi was normal   Pacing eliminated syncope  She has poorly controlled hypertension on a complex medical regime including high-dose metoprolol, amlodipine and lisinopril.  No bleeding, but has not been taking her anticoagulation  Recent ER visit for chest pain,  hsTn <18 x 2   No edema or palpitations  Date Cr K Hgb  7/20 0.91 3.7 12.6         DATE TEST EF   5/12 MYOVIEW 48% No ischemia  7/16 Myoview  No ischemia   2/19 Echo   65 % LVH severe--17/13                   Admitted 3/19 with slurred speech and facial symmetry   CT neg  Past Medical History:  Diagnosis Date  . Bradycardia   . Cancer ([place])    ovarian cancer  oophrectomy w/ only "1/4 ovary remaining"  . CHF (congestive heart failure) ([place])   . Chronic kidney disease   . Depression   . Hypertension   . Pancreatitis   . PTSD (post-traumatic stress disorder)   . SSS (sick sinus syndrome) ([place])    PPM implant 03/08/11 - [place] - SN [id number]      Surgical History:  Past Surgical History:  Procedure Laterality Date  . ABDOMINAL HYSTERECTOMY    . APPENDECTOMY    . PACEMAKER IMPLANT  03/08/2011   [employer] [phone]) - Implanted For SSS     Home Meds: Prior to Admission medications   Medication Sig Start Date End Date Taking? Authorizing Provider  amLODipine  (NORVASC) 10 MG tablet Take 10 mg by mouth daily.    [provider]  amoxicillin-clavulanate (AUGMENTIN) 875-125 MG tablet Take 1 tablet by mouth 2 (two) times daily. For 7 days Patient not taking: Reported on 04/09/2017 03/13/17   Olin Hauser, DO  ciprofloxacin (CIPRO) 500 MG tablet Take 1 tablet (500 mg total) by mouth 2 (two) times daily. Patient not taking: Reported on 04/09/2017 04/03/17 04/13/17  Gregor Hams, MD  ciprofloxacin-dexamethasone [facility]) OTIC suspension Place 4 drops into the left ear 2 (two) times daily. 03/13/17   Karamalegos, Devonne Doughty, DO  cyclobenzaprine (FLEXERIL) 10 MG tablet Take 1 tablet (10 mg total) by mouth 3 (three) times daily as needed for muscle spasms. Patient not taking: Reported on 04/09/2017 03/13/17   Olin Hauser, DO  dicyclomine (BENTYL) 10 MG capsule Take 1 capsule (10 mg total) by mouth 4 (four) times daily -  before meals and at bedtime. Patient not taking: Reported on 04/09/2017 04/03/17 04/17/17  Gregor Hams, MD  ipratropium (ATROVENT) 0.06 % nasal spray Place 2 sprays into both nostrils 4 (four) times daily. For up to 5-7 days then stop. Patient not taking: Reported on 04/09/2017 03/13/17   Olin Hauser, DO  lisinopril (PRINIVIL,ZESTRIL) 40 MG tablet  Take 40 mg by mouth daily.    [provider]  metoprolol (TOPROL-XL) 200 MG 24 hr tablet Take 200 mg by mouth daily.    [provider]  metroNIDAZOLE (FLAGYL) 500 MG tablet Take 1 tablet (500 mg total) by mouth 2 (two) times daily. Patient not taking: Reported on 04/09/2017 04/03/17 04/13/17  Gregor Hams, MD  promethazine (PHENERGAN) 12.5 MG tablet Take 1 tablet (12.5 mg total) by mouth every 6 (six) hours as needed for nausea or vomiting. 01/27/17   Merlyn Lot, MD    Current Meds  Medication Sig  . apixaban (ELIQUIS) 5 MG TABS tablet Take 1 tablet (5 mg total) by mouth 2 (two) times daily.  . divalproex (DEPAKOTE)  250 MG DR tablet Take 1 tablet (250 mg total) by mouth 3 (three) times daily. mood  . lisinopril (ZESTRIL) 40 MG tablet Take 1 tablet (40 mg total) by mouth daily.  . metoprolol (TOPROL-XL) 200 MG 24 hr tablet Take 1 tablet (200 mg total) by mouth daily.  Marland Kitchen PARoxetine (PAXIL) 20 MG tablet Take 1 tablet (20 mg total) by mouth daily.  . predniSONE (DELTASONE) 20 MG tablet Take 2 tablets (40 mg total) by mouth daily.     Allergies:  Allergies  Allergen Reactions  . Sulfa Antibiotics Rash and Hives  . Tramadol Hives  . Trazodone And Nefazodone Other (See Comments)     Severe nightmares    Social History   Socioeconomic History  . Marital status: Divorced    Spouse name: Not on file  . Number of children: 1  . Years of education: Not on file  . Highest education level: High school graduate  Occupational History  . 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  Social Needs  . Financial resource strain: Somewhat hard  . Food insecurity    Worry: Often true    Inability: Often true  . Transportation needs    Medical: Yes    Non-medical: No  Tobacco Use  . Smoking status: Current Every Day Smoker    Packs/day: 0.50    Years: 50.00    Pack years: 25.00    Types: Cigarettes  . Smokeless tobacco: Never Used  . Tobacco comment: one pack every 3 days  Substance and Sexual Activity  . Alcohol use: No  . Drug use: No  . Sexual activity: Not Currently  Lifestyle  . Physical activity    Days per week: 0 days    Minutes per session: 0 min  . Stress: Not at all  Relationships  . Social Herbalist on phone: Never    Gets together: Never    Attends religious service: Never    Active member of club or organization: No    Attends meetings of clubs or organizations: Never    Relationship status: Divorced  . Intimate partner violence    Fear of current or ex partner: No    Emotionally abused: No    Physically abused: No    Forced  sexual activity: No  Other Topics Concern  . Not on file  Social History Narrative   Moving in with her daughter.      Family History  Problem Relation Age of Onset  . Dementia Mother   . Heart attack Father        Died age 15  . Heart disease Sister        Congential  . Heart disease Sister        "  Some kind of heart surgery."  . Fibromyalgia Daughter   . Cancer Brother   . Cancer Brother      ROS:  Please see the history of present illness.     All other systems reviewed and negative.   Physical Exam  Blood pressure 124/60, pulse 61, height 5\' 2"  (1.575 m), weight 122 lb 9.6 oz (55.6 kg), SpO2 97 %. Well developed and nourished in no acute distress HENT normal Neck supple with JVP-  Flat  Device pocket well healed; without hematoma or erythema.  There is no tethering  Clear Regular rate and rhythm, no murmurs or gallops Abd-soft with active BS No Clubbing cyanosis edema Skin-warm and dry A & Oriented  Grossly normal sensory and motor function  ECG  A pacing 60 21/08/44 nsst  Cardiac Enzymes No results for input(s): CKTOTAL, CKMB, TROPONINI in the last 72 hours. CBC Lab Results  Component Value Date   WBC 7.1 12/12/2018   HGB 12.6 12/12/2018   HCT 38.7 12/12/2018   MCV 91.1 12/12/2018   PLT 201 12/12/2018   PROTIME: No results for input(s): LABPROT, INR in the last 72 hours. Chemistry  No results for input(s): NA, K, CL, CO2, BUN, CREATININE, CALCIUM, PROT, BILITOT, ALKPHOS, ALT, AST, GLUCOSE in the last 168 hours.  Invalid input(s): LABALBU Lipids Lab Results  Component Value Date   CHOL 222 (H) 08/16/2017   HDL 48 08/16/2017   LDLCALC 126 (H) 08/16/2017   TRIG 240 (H) 08/16/2017   BNP No results found for: PROBNP Thyroid Function Tests: No results for input(s): TSH, T4TOTAL, T3FREE, THYROIDAB in the last 72 hours.  Invalid input(s): FREET3 Miscellaneous No results found for: DDIMER  Radiology/Studies:  Dg Chest 2 View  Result Date:  12/12/2018 CLINICAL DATA:  Chest pain EXAM: CHEST - 2 VIEW COMPARISON:  April 09, 2017 FINDINGS: There is a stable dual chamber pacemaker in place. There are emphysematous changes bilaterally with hyperexpansion of the lung fields. There is no focal infiltrate. No pneumothorax. No large pleural effusion. There is no acute osseous abnormality. Dense aortic calcifications are noted. IMPRESSION: No active cardiopulmonary disease. Electronically Signed   By: Constance Holster M.D.   On: 12/12/2018 15:12      Assessment and Plan:  Syncope  Pacemaker-[employer]  Hypertension with hypertensive heart disease  Dyspnea on exertion  COPD  Chest pain  Atrial fibrillation-subclinical  TIA/CVA  Hyperlipidemia   No syncope  BP well controlled  No bleeding  Has run out of anticoagulation , will resume and stressed the importance of anticoagulation w prior CVA  I am curious as to the cause of her LVH, her echo reporting significant assymmetry, previously noted on echo from [place] [place] but the overall dimensions were 12 and not 17+  Prob 2/2 to her labile hypertension  Recurrent chest pain-- by hx and prior eval no evidence of CAD   ACC calculator >> 12.5 % risk--  Will have to talk with her PCP re statins    Virl Axe

## 2019-01-08 NOTE — Patient Instructions (Addendum)
Medication Instructions:  - Your physician recommends that you continue on your current medications as directed. Please refer to the Current Medication list given to you today.  Samples given: Eliquis 5 mg Lot: [id number] Exp: 11/2020 # 2 boxes  If you need a refill on your cardiac medications before your next appointment, please call your pharmacy.   Lab work: - none ordered  If you have labs (blood work) drawn today and your tests are completely normal, you will receive your results only by: Marland Kitchen MyChart Message (if you have MyChart) OR . A paper copy in the mail If you have any lab test that is abnormal or we need to change your treatment, we will call you to review the results.  Testing/Procedures: - none ordered  Follow-Up: At [facility], you and your health needs are our priority.  As part of our continuing mission to provide you with exceptional heart care, we have created designated Provider Care Teams.  These Care Teams include your primary Cardiologist (physician) and Advanced Practice Providers (APPs -  Physician Assistants and Nurse Practitioners) who all work together to provide you with the care you need, when you need it.  You will need a follow up appointment in 6 months (January) with the [facility] 1 year (late July/ early August) with Dr. [name]. Marland Kitchen Please call our office 2 months in advance to schedule this appointment.  (call in early November for the [facility] appointment & call in early May for the appointment with Dr. [name])  Any Other Special Instructions Will Be Listed Below (If Applicable). - N/A

## 2019-01-09 ENCOUNTER — Other Ambulatory Visit: Payer: Self-pay | Admitting: *Deleted

## 2019-01-09 MED ORDER — APIXABAN 5 MG PO TABS: 5.0000 mg | ORAL_TABLET | Freq: Two times a day (BID) | ORAL | 6 refills | Status: AC

## 2019-01-28 ENCOUNTER — Emergency Department: Payer: Medicare PPO

## 2019-01-28 ENCOUNTER — Observation Stay
Admission: EM | Admit: 2019-01-28 | Discharge: 2019-01-29 | Disposition: A | Discharge status: Home / Self Care | Payer: Medicare PPO | Attending: Internal Medicine | Admitting: Internal Medicine

## 2019-01-28 DIAGNOSIS — Z20828 Contact with and (suspected) exposure to other viral communicable diseases: Secondary | ICD-10-CM | POA: Diagnosis not present

## 2019-01-28 DIAGNOSIS — F4310 Post-traumatic stress disorder, unspecified: Secondary | ICD-10-CM | POA: Diagnosis not present

## 2019-01-28 DIAGNOSIS — Z7952 Long term (current) use of systemic steroids: Secondary | ICD-10-CM | POA: Insufficient documentation

## 2019-01-28 DIAGNOSIS — Z8543 Personal history of malignant neoplasm of ovary: Secondary | ICD-10-CM | POA: Diagnosis not present

## 2019-01-28 DIAGNOSIS — Z79899 Other long term (current) drug therapy: Secondary | ICD-10-CM | POA: Insufficient documentation

## 2019-01-28 DIAGNOSIS — N189 Chronic kidney disease, unspecified: Secondary | ICD-10-CM | POA: Insufficient documentation

## 2019-01-28 DIAGNOSIS — I700 Atherosclerosis of aorta: Secondary | ICD-10-CM | POA: Insufficient documentation

## 2019-01-28 DIAGNOSIS — K861 Other chronic pancreatitis: Secondary | ICD-10-CM

## 2019-01-28 DIAGNOSIS — K5900 Constipation, unspecified: Secondary | ICD-10-CM | POA: Insufficient documentation

## 2019-01-28 DIAGNOSIS — E785 Hyperlipidemia, unspecified: Secondary | ICD-10-CM | POA: Insufficient documentation

## 2019-01-28 DIAGNOSIS — R112 Nausea with vomiting, unspecified: Secondary | ICD-10-CM | POA: Diagnosis present

## 2019-01-28 DIAGNOSIS — I495 Sick sinus syndrome: Secondary | ICD-10-CM | POA: Diagnosis not present

## 2019-01-28 DIAGNOSIS — Z7901 Long term (current) use of anticoagulants: Secondary | ICD-10-CM | POA: Insufficient documentation

## 2019-01-28 DIAGNOSIS — Z950 Presence of cardiac pacemaker: Secondary | ICD-10-CM | POA: Insufficient documentation

## 2019-01-28 DIAGNOSIS — F3162 Bipolar disorder, current episode mixed, moderate: Secondary | ICD-10-CM | POA: Diagnosis not present

## 2019-01-28 DIAGNOSIS — Z8673 Personal history of transient ischemic attack (TIA), and cerebral infarction without residual deficits: Secondary | ICD-10-CM | POA: Diagnosis not present

## 2019-01-28 DIAGNOSIS — R1013 Epigastric pain: Secondary | ICD-10-CM | POA: Diagnosis not present

## 2019-01-28 DIAGNOSIS — F17210 Nicotine dependence, cigarettes, uncomplicated: Secondary | ICD-10-CM | POA: Diagnosis not present

## 2019-01-28 DIAGNOSIS — I130 Hypertensive heart and chronic kidney disease with heart failure and stage 1 through stage 4 chronic kidney disease, or unspecified chronic kidney disease: Secondary | ICD-10-CM | POA: Insufficient documentation

## 2019-01-28 LAB — CBC
HCT: 41.5 % (ref 36.0–46.0)
Hemoglobin: 13.8 g/dL (ref 12.0–15.0)
MCH: 29.7 pg (ref 26.0–34.0)
MCHC: 33.3 g/dL (ref 30.0–36.0)
MCV: 89.4 fL (ref 80.0–100.0)
Platelets: 224 10*3/uL (ref 150–400)
RBC: 4.64 MIL/uL (ref 3.87–5.11)
RDW: 12.7 % (ref 11.5–15.5)
WBC: 8.7 10*3/uL (ref 4.0–10.5)
nRBC: 0 % (ref 0.0–0.2)

## 2019-01-28 LAB — URINALYSIS, COMPLETE (UACMP) WITH MICROSCOPIC
Bacteria, UA: NONE SEEN
Bilirubin Urine: NEGATIVE
Glucose, UA: NEGATIVE mg/dL
Hgb urine dipstick: NEGATIVE
Ketones, ur: 5 mg/dL — AB
Leukocytes,Ua: NEGATIVE
Nitrite: NEGATIVE
Protein, ur: NEGATIVE mg/dL
Specific Gravity, Urine: 1.009 (ref 1.005–1.030)
Squamous Epithelial / HPF: NONE SEEN (ref 0–5)
pH: 6 (ref 5.0–8.0)

## 2019-01-28 LAB — BASIC METABOLIC PANEL
Anion gap: 9 (ref 5–15)
BUN: 18 mg/dL (ref 8–23)
CO2: 20 mmol/L — ABNORMAL LOW (ref 22–32)
Calcium: 9.5 mg/dL (ref 8.9–10.3)
Chloride: 110 mmol/L (ref 98–111)
Creatinine, Ser: 0.85 mg/dL (ref 0.44–1.00)
GFR calc Af Amer: >60 mL/min (ref >60)
GFR calc non Af Amer: >60 mL/min (ref >60)
Glucose, Bld: 118 mg/dL — ABNORMAL HIGH (ref 70–99)
Potassium: 4.1 mmol/L (ref 3.5–5.1)
Sodium: 139 mmol/L (ref 135–145)

## 2019-01-28 LAB — HEPATIC FUNCTION PANEL
ALT: 15 U/L (ref 0–44)
AST: 26 U/L (ref 15–41)
Albumin: 4 g/dL (ref 3.5–5.0)
Alkaline Phosphatase: 67 U/L (ref 38–126)
Bilirubin, Direct: 0.1 mg/dL (ref 0.0–0.2)
Indirect Bilirubin: 0.6 mg/dL (ref 0.3–0.9)
Total Bilirubin: 0.7 mg/dL (ref 0.3–1.2)
Total Protein: 6.9 g/dL (ref 6.5–8.1)

## 2019-01-28 LAB — TROPONIN I (HIGH SENSITIVITY)
Troponin I (High Sensitivity): 8 ng/L (ref <18)
Troponin I (High Sensitivity): 11 ng/L (ref <18)

## 2019-01-28 LAB — LIPASE, BLOOD: Lipase: 37 U/L (ref 11–51)

## 2019-01-28 MED ORDER — MORPHINE SULFATE (PF) 4 MG/ML IV SOLN
4.0000 mg | Freq: Once | INTRAVENOUS | Status: AC
  Administered 2019-01-29: 4 mg via INTRAVENOUS
  Filled 2019-01-28: qty 1

## 2019-01-28 MED ORDER — MORPHINE SULFATE (PF) 4 MG/ML IV SOLN
4.0000 mg | Freq: Once | INTRAVENOUS | Status: AC
  Administered 2019-01-28: 4 mg via INTRAVENOUS
  Filled 2019-01-28: qty 1

## 2019-01-28 MED ORDER — LACTATED RINGERS IV BOLUS
1000.0000 mL | Freq: Once | INTRAVENOUS | Status: AC
  Administered 2019-01-28: 20:00:00 1000 mL via INTRAVENOUS

## 2019-01-28 MED ORDER — ONDANSETRON HCL 4 MG/2ML IJ SOLN
4.0000 mg | Freq: Once | INTRAMUSCULAR | Status: AC
  Administered 2019-01-28: 4 mg via INTRAVENOUS
  Filled 2019-01-28: qty 2

## 2019-01-28 MED ORDER — ONDANSETRON HCL 4 MG/2ML IJ SOLN
4.0000 mg | Freq: Once | INTRAMUSCULAR | Status: AC | PRN
  Administered 2019-01-28: 4 mg via INTRAVENOUS
  Filled 2019-01-28: qty 2

## 2019-01-28 MED ORDER — IOHEXOL 300 MG/ML  SOLN
100.0000 mL | Freq: Once | INTRAMUSCULAR | Status: AC | PRN
  Administered 2019-01-28: 100 mL via INTRAVENOUS

## 2019-01-28 NOTE — ED Notes (Signed)
Called lab to check on hepatic panel and BMP.

## 2019-01-28 NOTE — ED Provider Notes (Signed)
Assumed care from Dr. Cherylann Banas. Acute on chronic abd pain, n/v with concern for recurrent acute on chronic pancreatitis. Persistently vomiting and in pain despite multiple rounds of antiemetics and analgesics here. Plan to f/u CT, admit for intractable n/v unless CT shows something surgical. Labs otherwise reassuring.  CT scan neg. Admit to medicine for intractable pain, n/v, with possible recurrent pancreatitis.    Lori Bruce, MD 01/29/19 [phone]

## 2019-01-28 NOTE — ED Triage Notes (Signed)
Patient c/o mid/left abdominal pain beginning yesterday. Patient reports multiple emeses since onset. Patient walked 1/2 mile to [employer], was unable to eat foot, walked back to hotel and called EMS. Patient report hx of pancreatitis, reports this feels same. Patient tearful.

## 2019-01-28 NOTE — ED Provider Notes (Signed)
[facility] Emergency Department Provider Note   ____________________________________________   First MD Initiated Contact with Patient 01/28/19 1913     (approximate)  I have reviewed the triage vital signs and the nursing notes.   HISTORY  Chief Complaint Abdominal Pain    HPI Lori Orr is a 68 y.o. female with past medical history of chronic pancreatitis, stroke on Eliquis, hypertension, chronic kidney disease, sick sinus syndrome status post pacemaker presents to the ED complaining of abdominal pain.  Patient reports worsening upper abdominal pain over the past 4 to 5 days which she describes as a sharp pain.  She states she has not eaten anything over the past 2 days due to severity of pain as well as getting nauseous and vomiting whenever she does eat.  Symptoms are similar to prior episodes of pancreatitis and she describes radiation into her back and chest.  She denies any fevers, cough, or shortness of breath.        Past Medical History:  Diagnosis Date  . Bradycardia   . Cancer ([place])    ovarian cancer  oophrectomy w/ only "1/4 ovary remaining"  . CHF (congestive heart failure) ([place])   . Chronic kidney disease   . Depression   . Hypertension   . Pancreatitis   . PTSD (post-traumatic stress disorder)   . SSS (sick sinus syndrome) ([place])    PPM implant 03/08/11 - [employer] [phone] - SN [id number]    Patient Active Problem List   Diagnosis Date Noted  . Abscess, gluteal, left   . TIA (transient ischemic attack) 08/15/2017  . Psychophysiological insomnia 05/19/2017  . Bipolar disorder, current episode mixed, moderate ([place]) 04/17/2017  . Recurrent pancreatitis 04/17/2017  . Ready to quit smoking 04/17/2017  . Atypical chest pain 02/25/2017  . Abnormal EKG 02/25/2017  . Cardiac pacemaker in situ 03/29/2016  . History of malignant neoplasm of cervix 03/29/2016  . Hypertensive disorder 03/29/2016  . Kidney stone 03/29/2016  .  Multiple nodules of lung 03/29/2016  . Acute pyelonephritis 07/26/2014    Past Surgical History:  Procedure Laterality Date  . ABDOMINAL HYSTERECTOMY    . APPENDECTOMY    . PACEMAKER IMPLANT  03/08/2011   [employer] [id number] [phone]) - Implanted For SSS    Prior to Admission medications   Medication Sig Start Date End Date Taking? Authorizing Provider  apixaban (ELIQUIS) 5 MG TABS tablet Take 1 tablet (5 mg total) by mouth 2 (two) times daily. 01/09/19   Deboraha Sprang, MD  atorvastatin (LIPITOR) 40 MG tablet Take 1 tablet (40 mg total) by mouth daily. Patient not taking: Reported on 01/08/2019 07/17/18 10/15/18  Mikey College, NP  cyclobenzaprine (FLEXERIL) 10 MG tablet Take 1 tablet (10 mg total) by mouth 3 (three) times daily as needed for muscle spasms. Patient not taking: Reported on 01/08/2019 11/04/18   Norval Gable, MD  divalproex (DEPAKOTE) 250 MG DR tablet Take 1 tablet (250 mg total) by mouth 3 (three) times daily. mood 10/22/18   Ursula Alert, MD  lisinopril (ZESTRIL) 40 MG tablet Take 1 tablet (40 mg total) by mouth daily. 11/16/18   Karamalegos, Devonne Doughty, DO  metoprolol (TOPROL-XL) 200 MG 24 hr tablet Take 1 tablet (200 mg total) by mouth daily. 07/17/18   Mikey College, NP  PARoxetine (PAXIL) 20 MG tablet Take 1 tablet (20 mg total) by mouth daily. 11/14/18   Deboraha Sprang, MD  predniSONE (DELTASONE) 20 MG tablet Take 2  tablets (40 mg total) by mouth daily. 11/15/18   Marylene Land, NP    Allergies Sulfa antibiotics, Tramadol, and Trazodone and nefazodone  Family History  Problem Relation Age of Onset  . Dementia Mother   . Heart attack Father        Died age 49  . Heart disease Sister        Congential  . Heart disease Sister        "Some kind of heart surgery."  . Fibromyalgia Daughter   . Cancer Brother   . Cancer Brother     Social History Social History   Tobacco Use  . Smoking status: Current Every Day Smoker    Packs/day: 0.50     Years: 50.00    Pack years: 25.00    Types: Cigarettes  . Smokeless tobacco: Never Used  . Tobacco comment: one pack every 3 days  Substance Use Topics  . Alcohol use: No  . Drug use: No    Review of Systems  Constitutional: No fever/chills Eyes: No visual changes. ENT: No sore throat. Cardiovascular: Positive for chest pain. Respiratory: Denies shortness of breath. Gastrointestinal: Positive for abdominal pain, nausea, and vomiting.  No diarrhea.  No constipation. Genitourinary: Negative for dysuria. Musculoskeletal: Negative for back pain. Skin: Negative for rash. Neurological: Negative for headaches, focal weakness or numbness.  ____________________________________________   PHYSICAL EXAM:  VITAL SIGNS: ED Triage Vitals [01/28/19 1914]  Enc Vitals Group     BP (!) 140/114     Pulse Rate 72     Resp (!) 22     Temp      Temp src      SpO2 97 %     Weight 123 lb 7.3 oz (56 kg)     Height 5\' 2"  (1.575 m)     Head Circumference      Peak Flow      Pain Score 10     Pain Loc      Pain Edu?      Excl. in [place]?     Constitutional: Alert and oriented.  Uncomfortable and tearful. Eyes: Conjunctivae are normal. Head: Atraumatic. Nose: No congestion/rhinnorhea. Mouth/Throat: Mucous membranes are moist. Neck: Normal ROM Cardiovascular: Normal rate, regular rhythm. Grossly normal heart sounds. Respiratory: Normal respiratory effort.  No retractions. Lungs CTAB. Gastrointestinal: Soft, tender to palpation in the epigastrium and left upper quadrant with voluntary guarding. No distention. Genitourinary: deferred Musculoskeletal: No lower extremity tenderness nor edema. Neurologic:  Normal speech and language. No gross focal neurologic deficits are appreciated. Skin:  Skin is warm, dry and intact. No rash noted. Psychiatric: Mood and affect are normal. Speech and behavior are normal.  ____________________________________________   LABS (all labs ordered are listed,  but only abnormal results are displayed)  Labs Reviewed  BASIC METABOLIC PANEL - Abnormal; Notable for the following components:      Result Value   CO2 20 (*)    Glucose, Bld 118 (*)    All other components within normal limits  LIPASE, BLOOD  CBC  HEPATIC FUNCTION PANEL  URINALYSIS, COMPLETE (UACMP) WITH MICROSCOPIC  TROPONIN I (HIGH SENSITIVITY)  TROPONIN I (HIGH SENSITIVITY)   ____________________________________________  EKG  ED ECG REPORT I, Blake Divine, the attending physician, personally viewed and interpreted this ECG.   Date: 01/28/2019  EKG Time: 19:17  Rate: 62  Rhythm: normal sinus rhythm, occasional PVC noted, unifocal  Axis: Normal  Intervals:none  ST&T Change: None    PROCEDURES  Procedure(s)  performed (including Critical Care):  Procedures   ____________________________________________   INITIAL IMPRESSION / ASSESSMENT AND PLAN / ED COURSE       68 year old female with past medical history of sick sinus syndrome status post pacemaker, chronic pancreatitis, presents to the ED complaining of epigastric pain radiating to her back and chest.  She has significant tenderness in her epigastric on exam in addition to her poor appetite and vomiting when she attempts to eat, this appears consistent with acute on chronic pancreatitis.  Will treat symptomatically and rehydrate, check labs.  Do not feel CT of her abdomen is indicated at this time.  EKG without acute ischemic changes, will check troponin but do not suspect ACS.  Patient turned over to Dr. Cherylann Banas pending further results and reevaluation of pain and nausea.      ____________________________________________   FINAL CLINICAL IMPRESSION(S) / ED DIAGNOSES  Final diagnoses:  Chronic pancreatitis, unspecified pancreatitis type ([place])  Epigastric pain  Nausea and vomiting, intractability of vomiting not specified, unspecified vomiting type     ED Discharge Orders    None        Note:  This document was prepared using Dragon voice recognition software and may include unintentional dictation errors.   Blake Divine, MD 01/28/19 2200

## 2019-01-28 NOTE — ED Provider Notes (Signed)
-----------------------------------------   11:25 PM on 01/28/2019 -----------------------------------------  I took over care on this patient from Dr. Charna Archer.  The patient has a history of chronic pancreatitis and presents with persistent nausea and vomiting over the last 2 days along with epigastric pain.  On reassessment, she continues to have persistent nausea and pain despite a few rounds of analgesia and antiemetic.  She still has tenderness in the epigastric area.  Her lab work-up is reassuring, however this could be chronic pancreatitis.  I have reviewed the past medical records and I see that the patient has not had any imaging of her abdomen in some time.  Given her age and the persistent pain and tenderness I will obtain a CT to further evaluate both to see if there is evidence of pancreatitis, and to rule out other causes.  We will plan for likely admission given the persistent nausea and pain.  I am signing the patient out to the oncoming physician Dr. Ellender Hose.   Arta Silence, MD 01/28/19 2326

## 2019-01-28 NOTE — ED Notes (Signed)
Patient transported to CT 

## 2019-01-28 NOTE — ED Notes (Signed)
This RN made 2 unsuccessful attempts at PIV insertion. 1 unsuccessful attempt made by EMS PTA. Deneise Lever RN at bedside to attempt PIV access.

## 2019-01-29 ENCOUNTER — Other Ambulatory Visit: Payer: Self-pay

## 2019-01-29 DIAGNOSIS — R112 Nausea with vomiting, unspecified: Secondary | ICD-10-CM | POA: Diagnosis present

## 2019-01-29 LAB — COMPREHENSIVE METABOLIC PANEL
ALT: 13 U/L (ref 0–44)
AST: 21 U/L (ref 15–41)
Albumin: 3.7 g/dL (ref 3.5–5.0)
Alkaline Phosphatase: 70 U/L (ref 38–126)
Anion gap: 5 (ref 5–15)
BUN: 14 mg/dL (ref 8–23)
CO2: 27 mmol/L (ref 22–32)
Calcium: 9 mg/dL (ref 8.9–10.3)
Chloride: 107 mmol/L (ref 98–111)
Creatinine, Ser: 0.83 mg/dL (ref 0.44–1.00)
GFR calc Af Amer: >60 mL/min (ref >60)
GFR calc non Af Amer: >60 mL/min (ref >60)
Glucose, Bld: 89 mg/dL (ref 70–99)
Potassium: 3.8 mmol/L (ref 3.5–5.1)
Sodium: 139 mmol/L (ref 135–145)
Total Bilirubin: 0.7 mg/dL (ref 0.3–1.2)
Total Protein: 6.5 g/dL (ref 6.5–8.1)

## 2019-01-29 LAB — CBC
HCT: 42.1 % (ref 36.0–46.0)
Hemoglobin: 13.7 g/dL (ref 12.0–15.0)
MCH: 29.7 pg (ref 26.0–34.0)
MCHC: 32.5 g/dL (ref 30.0–36.0)
MCV: 91.3 fL (ref 80.0–100.0)
Platelets: 212 10*3/uL (ref 150–400)
RBC: 4.61 MIL/uL (ref 3.87–5.11)
RDW: 12.7 % (ref 11.5–15.5)
WBC: 6.3 10*3/uL (ref 4.0–10.5)
nRBC: 0 % (ref 0.0–0.2)

## 2019-01-29 LAB — URINE DRUG SCREEN, QUALITATIVE (ARMC ONLY)
Amphetamines, Ur Screen: NOT DETECTED
Barbiturates, Ur Screen: NOT DETECTED
Benzodiazepine, Ur Scrn: NOT DETECTED
Cannabinoid 50 Ng, Ur ~~LOC~~: NOT DETECTED
Cocaine Metabolite,Ur ~~LOC~~: NOT DETECTED
MDMA (Ecstasy)Ur Screen: NOT DETECTED
Methadone Scn, Ur: NOT DETECTED
Opiate, Ur Screen: POSITIVE — AB
Phencyclidine (PCP) Ur S: NOT DETECTED
Tricyclic, Ur Screen: NOT DETECTED

## 2019-01-29 LAB — SARS CORONAVIRUS 2 (TAT 6-24 HRS): SARS Coronavirus 2: NEGATIVE

## 2019-01-29 MED ORDER — POLYETHYLENE GLYCOL 3350 17 G PO PACK: 17.0000 g | PACK | Freq: Every day | ORAL | Status: DC

## 2019-01-29 MED ORDER — ONDANSETRON HCL 4 MG PO TABS: 4.0000 mg | ORAL_TABLET | Freq: Three times a day (TID) | ORAL | 0 refills | Status: AC | PRN

## 2019-01-29 MED ORDER — ONDANSETRON HCL 4 MG PO TABS: 4.0000 mg | ORAL_TABLET | Freq: Four times a day (QID) | ORAL | Status: DC | PRN

## 2019-01-29 MED ORDER — PANTOPRAZOLE SODIUM 40 MG PO TBEC
40.0000 mg | DELAYED_RELEASE_TABLET | Freq: Every day | ORAL | Status: DC
  Administered 2019-01-29: 40 mg via ORAL
  Filled 2019-01-29: qty 1

## 2019-01-29 MED ORDER — ACETAMINOPHEN 325 MG PO TABS: 650.0000 mg | ORAL_TABLET | Freq: Four times a day (QID) | ORAL | Status: DC | PRN

## 2019-01-29 MED ORDER — ONDANSETRON HCL 4 MG/2ML IJ SOLN: 4.0000 mg | Freq: Four times a day (QID) | INTRAMUSCULAR | Status: DC | PRN

## 2019-01-29 MED ORDER — APIXABAN 5 MG PO TABS
5.0000 mg | ORAL_TABLET | Freq: Two times a day (BID) | ORAL | Status: DC
  Administered 2019-01-29: 5 mg via ORAL
  Filled 2019-01-29 (×2): qty 1

## 2019-01-29 MED ORDER — SODIUM CHLORIDE 0.9 % IV SOLN
INTRAVENOUS | Status: DC
  Administered 2019-01-29: 05:00:00 via INTRAVENOUS

## 2019-01-29 MED ORDER — AMLODIPINE BESYLATE 10 MG PO TABS
10.0000 mg | ORAL_TABLET | Freq: Every day | ORAL | Status: DC
  Administered 2019-01-29: 10 mg via ORAL
  Filled 2019-01-29: qty 1

## 2019-01-29 MED ORDER — ACETAMINOPHEN 650 MG RE SUPP: 650.0000 mg | Freq: Four times a day (QID) | RECTAL | Status: DC | PRN

## 2019-01-29 MED ORDER — ENOXAPARIN SODIUM 40 MG/0.4ML ~~LOC~~ SOLN: 40.0000 mg | SUBCUTANEOUS | Status: DC

## 2019-01-29 MED ORDER — POLYETHYLENE GLYCOL 3350 17 G PO PACK: 17.0000 g | PACK | Freq: Every day | ORAL | 0 refills | Status: AC

## 2019-01-29 MED ORDER — TRAZODONE HCL 50 MG PO TABS: 25.0000 mg | ORAL_TABLET | Freq: Every evening | ORAL | Status: DC | PRN

## 2019-01-29 MED ORDER — MAGNESIUM HYDROXIDE 400 MG/5ML PO SUSP: 30.0000 mL | Freq: Every day | ORAL | Status: DC | PRN

## 2019-01-29 MED ORDER — METOCLOPRAMIDE HCL 5 MG/ML IJ SOLN
5.0000 mg | Freq: Four times a day (QID) | INTRAMUSCULAR | Status: DC
  Administered 2019-01-29 (×2): 5 mg via INTRAVENOUS
  Filled 2019-01-29 (×2): qty 2

## 2019-01-29 MED ORDER — OXYCODONE HCL 5 MG PO TABS: 5.0000 mg | ORAL_TABLET | Freq: Three times a day (TID) | ORAL | 0 refills | Status: AC | PRN

## 2019-01-29 MED ORDER — MORPHINE SULFATE (PF) 2 MG/ML IV SOLN
2.0000 mg | INTRAVENOUS | Status: DC | PRN
  Administered 2019-01-29 (×2): 2 mg via INTRAVENOUS
  Filled 2019-01-29 (×2): qty 1

## 2019-01-29 MED ORDER — LISINOPRIL 20 MG PO TABS
40.0000 mg | ORAL_TABLET | Freq: Every day | ORAL | Status: DC
  Administered 2019-01-29: 40 mg via ORAL
  Filled 2019-01-29: qty 2

## 2019-01-29 MED ORDER — KETOROLAC TROMETHAMINE 30 MG/ML IJ SOLN
15.0000 mg | Freq: Once | INTRAMUSCULAR | Status: AC
  Administered 2019-01-29: 15 mg via INTRAVENOUS
  Filled 2019-01-29: qty 1

## 2019-01-29 MED ORDER — FLEET ENEMA 7-19 GM/118ML RE ENEM: 1.0000 | ENEMA | Freq: Once | RECTAL | Status: DC

## 2019-01-29 MED ORDER — PAROXETINE HCL 20 MG PO TABS
20.0000 mg | ORAL_TABLET | Freq: Every day | ORAL | Status: DC
  Administered 2019-01-29: 20 mg via ORAL
  Filled 2019-01-29: qty 1

## 2019-01-29 MED ORDER — DIVALPROEX SODIUM 250 MG PO DR TAB
250.0000 mg | DELAYED_RELEASE_TABLET | Freq: Three times a day (TID) | ORAL | Status: DC
  Administered 2019-01-29: 250 mg via ORAL
  Filled 2019-01-29 (×3): qty 1

## 2019-01-29 MED ORDER — METOPROLOL SUCCINATE ER 50 MG PO TB24
200.0000 mg | ORAL_TABLET | Freq: Every day | ORAL | Status: DC
  Filled 2019-01-29: qty 4

## 2019-01-29 NOTE — H&P (Signed)
[facility] [place] NAME: Lori Orr    MR#:  [id number]  DATE OF BIRTH:  01-12-1951  DATE OF ADMISSION:  01/28/2019  PRIMARY CARE PHYSICIAN: Ulyess Blossom, PA   REQUESTING/REFERRING PHYSICIAN: Duffy Bruce, MD CHIEF COMPLAINT:   Chief Complaint  Patient presents with   Abdominal Pain    HISTORY OF PRESENT ILLNESS:  Lori Orr  is a 68 y.o. Caucasian female with a known history of multiple medical problems documented below, who presented to the emergency room with a consider recurrent nausea and vomiting for the last couple of days with left upper quadrant abdominal pain.  She denied any bilious vomitus or hematemesis.  No melena or bright red bleeding per rectum.  She denied any diarrhea.  No chest pain or dyspnea or cough or wheezing.  No sick exposures.  No dysuria, oliguria or hematuria or flank pain.  Upon presentation to the emergency room, blood pressure was 143/75 with otherwise normal vital signs.  Labs were unremarkable.  Serum lipase was 37.  COVID-19 test is pending.  Abdominal pelvic CT scan revealed moderate stool burden with no acute abnormalities.  The patient was given 50 mg of IV Toradol as well as 1 L bolus of IV lactated Ringer, morphine sulfate 4 mg IV x3 and Zofran 4 mg IV x3.  She will be admitted to a medical bed for further evaluation and management.  PAST MEDICAL HISTORY:   Past Medical History:  Diagnosis Date   Bradycardia    Cancer ([place])    ovarian cancer  oophrectomy w/ only "1/4 ovary remaining"   CHF (congestive heart failure) (HCC)    Chronic kidney disease    Depression    Hypertension    Pancreatitis    PTSD (post-traumatic stress disorder)    SSS (sick sinus syndrome) ([place])    PPM implant 03/08/11 - [place] - SN [id number]    PAST SURGICAL HISTORY:   Past Surgical History:  Procedure Laterality Date   ABDOMINAL HYSTERECTOMY     APPENDECTOMY      PACEMAKER IMPLANT  03/08/2011   [employer] [id number] [phone]) - Implanted For SSS    SOCIAL HISTORY:   Social History   Tobacco Use   Smoking status: Current Every Day Smoker    Packs/day: 0.50    Years: 50.00    Pack years: 25.00    Types: Cigarettes   Smokeless tobacco: Never Used   Tobacco comment: one pack every 3 days  Substance Use Topics   Alcohol use: No    FAMILY HISTORY:   Family History  Problem Relation Age of Onset   Dementia Mother    Heart attack Father        Died age 42   Heart disease Sister        Congential   Heart disease Sister        "Some kind of heart surgery."   Fibromyalgia Daughter    Cancer Brother    Cancer Brother     DRUG ALLERGIES:   Allergies  Allergen Reactions   Sulfa Antibiotics Rash and Hives   Tramadol Hives   Trazodone And Nefazodone Other (See Comments)     Severe nightmares    REVIEW OF SYSTEMS:   ROS As per history of present illness. All pertinent systems were reviewed above. Constitutional,  HEENT, cardiovascular, respiratory, GI, GU, musculoskeletal, neuro, psychiatric, endocrine,  integumentary and hematologic systems were reviewed and are  otherwise  negative/unremarkable except for positive findings mentioned above in the HPI.   MEDICATIONS AT HOME:   Prior to Admission medications   Medication Sig Start Date End Date Taking? Authorizing Provider  amLODipine (NORVASC) 10 MG tablet Take 10 mg by mouth daily. 11/03/18  Yes [provider]  apixaban (ELIQUIS) 5 MG TABS tablet Take 1 tablet (5 mg total) by mouth 2 (two) times daily. 01/09/19  Yes Deboraha Sprang, MD  divalproex (DEPAKOTE) 250 MG DR tablet Take 1 tablet (250 mg total) by mouth 3 (three) times daily. mood 10/22/18  Yes Eappen, Ria Clock, MD  lisinopril (ZESTRIL) 40 MG tablet Take 1 tablet (40 mg total) by mouth daily. 11/16/18  Yes Karamalegos, Devonne Doughty, DO  metoprolol (TOPROL-XL) 200 MG 24 hr tablet Take 1 tablet (200 mg  total) by mouth daily. 07/17/18  Yes Mikey College, NP  pantoprazole (PROTONIX) 40 MG tablet Take 40 mg by mouth daily. 12/12/18  Yes [provider]  PARoxetine (PAXIL) 20 MG tablet Take 1 tablet (20 mg total) by mouth daily. 11/14/18  Yes Deboraha Sprang, MD      VITAL SIGNS:  Blood pressure 126/74, pulse 61, temperature 98.1 F (36.7 C), resp. rate 18, height 5\' 2"  (1.575 m), weight 53.4 kg, SpO2 97 %.  PHYSICAL EXAMINATION:  Physical Exam  GENERAL:  68 y.o.-year-old Caucasian female patient lying in the bed with no acute distress.  EYES: Pupils equal, round, reactive to light and accommodation. No scleral icterus. Extraocular muscles intact.  HEENT: Head atraumatic, normocephalic. Oropharynx and nasopharynx clear.  NECK:  Supple, no jugular venous distention. No thyroid enlargement, no tenderness.  LUNGS: Normal breath sounds bilaterally, no wheezing, rales,rhonchi or crepitation. No use of accessory muscles of respiration.  CARDIOVASCULAR: Regular rate and rhythm, S1, S2 normal. No murmurs, rubs, or gallops.  ABDOMEN: Soft with left upper quadrant and epigastric tenderness without rebound tenderness guarding or rigidity.. Bowel sounds present. No organomegaly or mass.  EXTREMITIES: No pedal edema, cyanosis, or clubbing.  NEUROLOGIC: Cranial nerves II through XII are intact. Muscle strength 5/5 in all extremities. Sensation intact. Gait not checked.  PSYCHIATRIC: The patient is alert and oriented x 3.  Normal affect and good eye contact. SKIN: No obvious rash, lesion, or ulcer.   LABORATORY PANEL:   CBC Recent Labs  Lab 01/28/19 1946  WBC 8.7  HGB 13.8  HCT 41.5  PLT 224   ------------------------------------------------------------------------------------------------------------------  Chemistries  Recent Labs  Lab 01/28/19 1917  NA 139  K 4.1  CL 110  CO2 20*  GLUCOSE 118*  BUN 18  CREATININE 0.85  CALCIUM 9.5  AST 26  ALT 15  ALKPHOS 67  BILITOT  0.7   ------------------------------------------------------------------------------------------------------------------  Cardiac Enzymes No results for input(s): TROPONINI in the last 168 hours. ------------------------------------------------------------------------------------------------------------------  RADIOLOGY:  Ct Abdomen Pelvis W Contrast  Result Date: 01/29/2019 CLINICAL DATA:  Worsening abdominal pain EXAM: CT ABDOMEN AND PELVIS WITH CONTRAST TECHNIQUE: Multidetector CT imaging of the abdomen and pelvis was performed using the standard protocol following bolus administration of intravenous contrast. CONTRAST:  129mL OMNIPAQUE IOHEXOL 300 MG/ML  SOLN COMPARISON:  August 20, 2018 FINDINGS: Lower chest: There is stable pulmonary nodules at the right lung base and within the right middle lobe.Heart size is enlarged. Hepatobiliary: The liver is normal. Normal gallbladder.The common bile duct is dilated. Pancreas: The pancreatic duct is dilated but this is similar to prior studies. There are few small calcifications in the pancreas likely related to the  patient's reported history of pancreatitis. Spleen: No splenic laceration or hematoma. Adrenals/Urinary Tract: --Adrenal glands: A benign appearing right adrenal adenoma is again noted. The left adrenal gland is unremarkable. --Right kidney/ureter: Multiple small cysts are again noted. --Left kidney/ureter: Multiple small left-sided renal cysts are again noted. There are some vascular calcifications. --Urinary bladder: Unremarkable. Stomach/Bowel: --Stomach/Duodenum: No hiatal hernia or other gastric abnormality. Normal duodenal course and caliber. --Small bowel: No dilatation or inflammation. --Colon: Moderate amount of stool throughout the colon --Appendix: Not visualized. No right lower quadrant inflammation or free fluid. Vascular/Lymphatic: Atherosclerotic calcification is present within the non-aneurysmal abdominal aorta, without  hemodynamically significant stenosis. --No retroperitoneal lymphadenopathy. --No mesenteric lymphadenopathy. --No pelvic or inguinal lymphadenopathy. Reproductive: Unremarkable Other: No ascites or free air. The abdominal wall is normal. Musculoskeletal. No acute displaced fractures. IMPRESSION: 1. No acute abdominal abnormality detected. 2. Moderate stool burden. 3.  Aortic Atherosclerosis (ICD10-I70.0). Electronically Signed   By: Constance Holster M.D.   On: 01/29/2019 00:14      IMPRESSION AND PLAN:   1.  Abdominal pain with intractable nausea and vomiting.  Differential diagnosis would include developing acute on chronic pancreatitis versus cyclic vomiting.  The patient will be admitted to a medical monitored bed.  She will be kept n.p.o.  Will place on scheduled IV Reglan as well as PRN IV Zofran.  Pain management will be provided.  IV PPI therapy will be provided.  2.  Hypertension.  We will continue amlodipine and Zestril.  3.  Depression.  Wellbutrin XL will be continued.  4.  Dyslipidemia.  Statin therapy will be resumed.  5.  DVT prophylaxis.  Subcutaneous Lovenox  All the records are reviewed and case discussed with ED provider. The plan of care was discussed in details with the patient (and family). I answered all questions. The patient agreed to proceed with the above mentioned plan. Further management will depend upon hospital course.   CODE STATUS: Full code  TOTAL TIME TAKING CARE OF THIS PATIENT: 55 minutes.    Christel Mormon M.D on 01/29/2019 at 5:58 AM  Pager - [phone]  After 6pm go to [email or web address] - Proofreader  [facility]  Office  [phone]  CC: Primary care physician; Ulyess Blossom, PA   Note: This dictation was prepared with Dragon dictation along with smaller phrase technology. Any transcriptional errors that result from this process are unintentional.

## 2019-01-29 NOTE — Discharge Summary (Signed)
[facility] [place] NAME: Lori Orr    MR#:  [id number]  DATE OF BIRTH:  December 07, 1950  DATE OF ADMISSION:  01/28/2019 ADMITTING PHYSICIAN: Christel Mormon, MD  DATE OF DISCHARGE: 01/29/2019  PRIMARY CARE PHYSICIAN: Ulyess Blossom, PA    ADMISSION DIAGNOSIS:  Epigastric pain [R10.13] Chronic pancreatitis, unspecified pancreatitis type ([place]) [K86.1] Nausea and vomiting, intractability of vomiting not specified, unspecified vomiting type [R11.2]  DISCHARGE DIAGNOSIS:  Active Problems:   Intractable nausea and vomiting   SECONDARY DIAGNOSIS:   Past Medical History:  Diagnosis Date  . Bradycardia   . Cancer ([place])    ovarian cancer  oophrectomy w/ only "1/4 ovary remaining"  . CHF (congestive heart failure) ([place])   . Chronic kidney disease   . Depression   . Hypertension   . Pancreatitis   . PTSD (post-traumatic stress disorder)   . SSS (sick sinus syndrome) ([place])    PPM implant 03/08/11 - [employer] [id number] - SN [id number]    HOSPITAL COURSE:   1.  Abdominal pain with intractable nausea vomiting.  The patient came in today.  The nurse called me that the patient wanted to eat.  The patient was eating lunch.  She states that she wants to go home.  She was asking for some nausea medication and some pain medication to go home with.  Follow-up with PMD as outpatient.  React enzymes x2 were negative.  Follow-up as outpatient with cardiology for interrogation of pacer. 2.  Constipation seen on CT scan.  Start MiraLAX 17 g daily 3.  Hypertension on amlodipine and Zestril 4.  Depression on Wellbutrin 5.  Hyperlipidemia on statin  6.  Patient also takes Eliquis   DISCHARGE CONDITIONS:   Satisfactory  CONSULTS OBTAINED:  None  DRUG ALLERGIES:   Allergies  Allergen Reactions  . Sulfa Antibiotics Rash and Hives  . Tramadol Hives  . Trazodone And Nefazodone Other (See Comments)     Severe nightmares    DISCHARGE MEDICATIONS:    Allergies as of 01/29/2019      Reactions   Sulfa Antibiotics Rash, Hives   Tramadol Hives   Trazodone And Nefazodone Other (See Comments)    Severe nightmares      Medication List    TAKE these medications   amLODipine 10 MG tablet Commonly known as: NORVASC Take 10 mg by mouth daily.   apixaban 5 MG Tabs tablet Commonly known as: ELIQUIS Take 1 tablet (5 mg total) by mouth 2 (two) times daily.   divalproex 250 MG DR tablet Commonly known as: DEPAKOTE Take 1 tablet (250 mg total) by mouth 3 (three) times daily. mood   lisinopril 40 MG tablet Commonly known as: ZESTRIL Take 1 tablet (40 mg total) by mouth daily.   metoprolol 200 MG 24 hr tablet Commonly known as: TOPROL-XL Take 1 tablet (200 mg total) by mouth daily.   ondansetron 4 MG tablet Commonly known as: ZOFRAN Take 1 tablet (4 mg total) by mouth every 8 (eight) hours as needed for nausea or vomiting.   oxyCODONE 5 MG immediate release tablet Commonly known as: Roxicodone Take 1 tablet (5 mg total) by mouth every 8 (eight) hours as needed for moderate pain or severe pain.   pantoprazole 40 MG tablet Commonly known as: PROTONIX Take 40 mg by mouth daily.   PARoxetine 20 MG tablet Commonly known as: PAXIL Take 1 tablet (20 mg total) by mouth daily.   polyethylene glycol  17 g packet Commonly known as: MIRALAX / GLYCOLAX Take 17 g by mouth daily.        DISCHARGE INSTRUCTIONS:   Follow-up PMD 5 days  If you experience worsening of your admission symptoms, develop shortness of breath, life threatening emergency, suicidal or homicidal thoughts you must seek medical attention immediately by calling 911 or calling your MD immediately  if symptoms less severe.  You Must read complete instructions/literature along with all the possible adverse reactions/side effects for all the Medicines you take and that have been prescribed to you. Take any new Medicines after you have completely understood and accept  all the possible adverse reactions/side effects.   Please note  You were cared for by a hospitalist during your hospital stay. If you have any questions about your discharge medications or the care you received while you were in the hospital after you are discharged, you can call the unit and asked to speak with the hospitalist on call if the hospitalist that took care of you is not available. Once you are discharged, your primary care physician will handle any further medical issues. Please note that NO REFILLS for any discharge medications will be authorized once you are discharged, as it is imperative that you return to your primary care physician (or establish a relationship with a primary care physician if you do not have one) for your aftercare needs so that they can reassess your need for medications and monitor your lab values.    Today   CHIEF COMPLAINT:   Chief Complaint  Patient presents with  . Abdominal Pain    HISTORY OF PRESENT ILLNESS:  Lori Orr  is a 68 y.o. female coming in with abdominal pain   VITAL SIGNS:  Blood pressure 116/73, pulse (!) 59, temperature 98 F (36.7 C), temperature source Oral, resp. rate 18, height 5\' 2"  (1.575 m), weight 53.4 kg, SpO2 98 %.  PHYSICAL EXAMINATION:  GENERAL:  68 y.o.-year-old patient lying in the bed with no acute distress.  EYES: Pupils equal, round, reactive to light and accommodation. No scleral icterus. Extraocular muscles intact.  HEENT: Head atraumatic, normocephalic. Oropharynx and nasopharynx clear.  NECK:  Supple, no jugular venous distention. No thyroid enlargement, no tenderness.  LUNGS: Normal breath sounds bilaterally, no wheezing, rales,rhonchi or crepitation. No use of accessory muscles of respiration.  CARDIOVASCULAR: S1, S2 normal. No murmurs, rubs, or gallops.  ABDOMEN: Soft, tender, non-distended. Bowel sounds present. No organomegaly or mass.  EXTREMITIES: No pedal edema, cyanosis, or clubbing.   NEUROLOGIC: Cranial nerves II through XII are intact. Muscle strength 5/5 in all extremities. Sensation intact. Gait not checked.  PSYCHIATRIC: The patient is alert and oriented x 3.  SKIN: No obvious rash, lesion, or ulcer.   DATA REVIEW:   CBC Recent Labs  Lab 01/29/19 0601  WBC 6.3  HGB 13.7  HCT 42.1  PLT 212    Chemistries  Recent Labs  Lab 01/29/19 0601  NA 139  K 3.8  CL 107  CO2 27  GLUCOSE 89  BUN 14  CREATININE 0.83  CALCIUM 9.0  AST 21  ALT 13  ALKPHOS 70  BILITOT 0.7    Microbiology Results  Results for orders placed or performed during the hospital encounter of 01/28/19  SARS CORONAVIRUS 2 Nasal Swab Aptima Multi Swab     Status: None   Collection Time: 01/29/19 12:44 AM   Specimen: Aptima Multi Swab; Nasal Swab  Result Value Ref Range Status   SARS Coronavirus  2 NEGATIVE NEGATIVE Final    Comment: (NOTE) SARS-CoV-2 target nucleic acids are NOT DETECTED. The SARS-CoV-2 RNA is generally detectable in upper and lower respiratory specimens during the acute phase of infection. Negative results do not preclude SARS-CoV-2 infection, do not rule out co-infections with other pathogens, and should not be used as the sole basis for treatment or other patient management decisions. Negative results must be combined with clinical observations, patient history, and epidemiological information. The expected result is Negative. Fact Sheet for Patients: SugarRoll.be Fact Sheet for Healthcare Providers: https://www.woods-mathews.com/ This test is not yet approved or cleared by the [place] FDA and  has been authorized for detection and/or diagnosis of SARS-CoV-2 by FDA under an Emergency Use Authorization (EUA). This EUA will remain  in effect (meaning this test can be used) for the duration of the COVID-19 declaration under Section 56 4(b)(1) of the Act, 21 U.S.C. section 360bbb-3(b)(1), unless the authorization  is terminated or revoked sooner. Performed at [facility], [place] [address], [place],  [address]     RADIOLOGY:  Ct Abdomen Pelvis W Contrast  Result Date: 01/29/2019 CLINICAL DATA:  Worsening abdominal pain EXAM: CT ABDOMEN AND PELVIS WITH CONTRAST TECHNIQUE: Multidetector CT imaging of the abdomen and pelvis was performed using the standard protocol following bolus administration of intravenous contrast. CONTRAST:  128mL OMNIPAQUE IOHEXOL 300 MG/ML  SOLN COMPARISON:  August 20, 2018 FINDINGS: Lower chest: There is stable pulmonary nodules at the right lung base and within the right middle lobe.Heart size is enlarged. Hepatobiliary: The liver is normal. Normal gallbladder.The common bile duct is dilated. Pancreas: The pancreatic duct is dilated but this is similar to prior studies. There are few small calcifications in the pancreas likely related to the patient's reported history of pancreatitis. Spleen: No splenic laceration or hematoma. Adrenals/Urinary Tract: --Adrenal glands: A benign appearing right adrenal adenoma is again noted. The left adrenal gland is unremarkable. --Right kidney/ureter: Multiple small cysts are again noted. --Left kidney/ureter: Multiple small left-sided renal cysts are again noted. There are some vascular calcifications. --Urinary bladder: Unremarkable. Stomach/Bowel: --Stomach/Duodenum: No hiatal hernia or other gastric abnormality. Normal duodenal course and caliber. --Small bowel: No dilatation or inflammation. --Colon: Moderate amount of stool throughout the colon --Appendix: Not visualized. No right lower quadrant inflammation or free fluid. Vascular/Lymphatic: Atherosclerotic calcification is present within the non-aneurysmal abdominal aorta, without hemodynamically significant stenosis. --No retroperitoneal lymphadenopathy. --No mesenteric lymphadenopathy. --No pelvic or inguinal lymphadenopathy. Reproductive: Unremarkable Other: No ascites or free air. The  abdominal wall is normal. Musculoskeletal. No acute displaced fractures. IMPRESSION: 1. No acute abdominal abnormality detected. 2. Moderate stool burden. 3.  Aortic Atherosclerosis (ICD10-I70.0). Electronically Signed   By: Constance Holster M.D.   On: 01/29/2019 00:14      Management plans discussed with the patient, and she told me she is going home today.  CODE STATUS:     Code Status Orders  (From admission, onward)         Start     Ordered   01/29/19 0234  Full code  Continuous     01/29/19 0236        Code Status History    Date Active Date Inactive Code Status Order ID Comments User Context   08/16/2017 0014 08/16/2017 1844 Full Code [id number]  Amelia Jo, MD Inpatient   02/25/2017 1653 02/26/2017 1313 Full Code [id number]  Idelle Crouch, MD Inpatient   Advance Care Planning Activity      TOTAL TIME TAKING  CARE OF THIS PATIENT: 35 minutes.    Loletha Grayer M.D on 01/29/2019 at 2:30 PM  Between 7am to 6pm - Pager - [phone]  After 6pm go to [email or web address] - Proofreader  [employer] [facility] Office  [phone]  CC: Primary care physician; Ulyess Blossom, PA

## 2019-01-29 NOTE — TOC Transition Note (Signed)
Transition of Care [facility]) - CM/SW Discharge Note   Patient Details  Name: Lori Orr MRN: [id number] Date of Birth: 06/20/1950  [facility] CM/SW Contact:  Su Hilt, RN Phone Number: 01/29/2019, 3:06 PM   Clinical Narrative:    Patient Discharged back to her motel that she resides at in [place] and provided with a Taxi voucher for transportation to get there Address [address]   Final next level of care: Home/Self Care Barriers to Discharge: Barriers Resolved   Patient Goals and CMS Choice        Discharge Placement                       Discharge Plan and Services                                     Social Determinants of Health (SDOH) Interventions     Readmission Risk Interventions No flowsheet data found.

## 2019-01-29 NOTE — Discharge Instructions (Signed)
Abdominal Pain, Adult    Many things can cause belly (abdominal) pain. Most times, belly pain is not dangerous. Many cases of belly pain can be watched and treated at home. Sometimes belly pain is serious, though. Your doctor will try to find the cause of your belly pain.  Follow these instructions at home:  · Take over-the-counter and prescription medicines only as told by your doctor. Do not take medicines that help you poop (laxatives) unless told to by your doctor.  · Drink enough fluid to keep your pee (urine) clear or pale yellow.  · Watch your belly pain for any changes.  · Keep all follow-up visits as told by your doctor. This is important.  Contact a doctor if:  · Your belly pain changes or gets worse.  · You are not hungry, or you lose weight without trying.  · You are having trouble pooping (constipated) or have watery poop (diarrhea) for more than 2-3 days.  · You have pain when you pee or poop.  · Your belly pain wakes you up at night.  · Your pain gets worse with meals, after eating, or with certain foods.  · You are throwing up and cannot keep anything down.  · You have a fever.  Get help right away if:  · Your pain does not go away as soon as your doctor says it should.  · You cannot stop throwing up.  · Your pain is only in areas of your belly, such as the right side or the left lower part of the belly.  · You have bloody or black poop, or poop that looks like tar.  · You have very bad pain, cramping, or bloating in your belly.  · You have signs of not having enough fluid or water in your body (dehydration), such as:  ? Dark pee, very little pee, or no pee.  ? Cracked lips.  ? Dry mouth.  ? Sunken eyes.  ? Sleepiness.  ? Weakness.  This information is not intended to replace advice given to you by your health care provider. Make sure you discuss any questions you have with your health care provider.  Document Released: 11/16/2007 Document Revised: 12/18/2015 Document Reviewed: 11/11/2015  Elsevier  Interactive Patient Education © 2020 [employer] Inc.

## 2019-01-29 NOTE — ED Notes (Signed)
Attempted to call report to floor. Primary RN unavailable - in another patient's room. Will re-attempt call.

## 2019-01-29 NOTE — ED Notes (Signed)
Patient given popsicle per MD Mansy.

## 2019-01-29 NOTE — ED Notes (Signed)
ED TO INPATIENT HANDOFF REPORT  ED Nurse Name and Phone #: Balinda Quails Name/Age/Gender Lori Orr 68 y.o. female Room/Bed: ED12A/ED12A  Code Status   Code Status: Full Code  Home/SNF/Other Home Patient oriented to: self, place, time and situation Is this baseline? Yes   Triage Complete: Triage complete  Chief Complaint EMS  Triage Note Patient c/o mid/left abdominal pain beginning yesterday. Patient reports multiple emeses since onset. Patient walked 1/2 mile to [employer], was unable to eat foot, walked back to hotel and called EMS. Patient report hx of pancreatitis, reports this feels same. Patient tearful.    Allergies Allergies  Allergen Reactions  . Sulfa Antibiotics Rash and Hives  . Tramadol Hives  . Trazodone And Nefazodone Other (See Comments)     Severe nightmares    Level of Care/Admitting Diagnosis ED Disposition    ED Disposition Condition [facility]: [place] [[id number]]  Level of Care: Med-Surg [16]  Covid Evaluation: Asymptomatic Screening Protocol (No Symptoms)  Diagnosis: Intractable nausea and vomiting [937342]  Admitting Physician: Christel Mormon [[id number]]  Attending Physician: Christel Mormon [[id number]]  PT Class (Do Not Modify): Observation [104]  PT Acc Code (Do Not Modify): Observation [10022]       B Medical/Surgery History Past Medical History:  Diagnosis Date  . Bradycardia   . Cancer ([place])    ovarian cancer  oophrectomy w/ only "1/4 ovary remaining"  . CHF (congestive heart failure) ([place])   . Chronic kidney disease   . Depression   . Hypertension   . Pancreatitis   . PTSD (post-traumatic stress disorder)   . SSS (sick sinus syndrome) ([place])    PPM implant 03/08/11 [facility] [employer] [id number] - SN [id number]   Past Surgical History:  Procedure Laterality Date  . ABDOMINAL HYSTERECTOMY    . APPENDECTOMY    . PACEMAKER IMPLANT  03/08/2011   [employer] [id number] [phone]) - Implanted For  SSS     A IV Location/Drains/Wounds Patient Lines/Drains/Airways Status   Active Line/Drains/Airways    Name:   Placement date:   Placement time:   Site:   Days:   Peripheral IV 01/28/19 Left Hand   01/28/19    1950    Hand   1          Intake/Output Last 24 hours No intake or output data in the 24 hours ending 01/29/19 0333  Labs/Imaging Results for orders placed or performed during the hospital encounter of 01/28/19 (from the past 48 hour(s))  Basic metabolic panel     Status: Abnormal   Collection Time: 01/28/19  7:17 PM  Result Value Ref Range   Sodium 139 135 - 145 mmol/L   Potassium 4.1 3.5 - 5.1 mmol/L   Chloride 110 98 - 111 mmol/L   CO2 20 (L) 22 - 32 mmol/L   Glucose, Bld 118 (H) 70 - 99 mg/dL   BUN 18 8 - 23 mg/dL   Creatinine, Ser 0.85 0.44 - 1.00 mg/dL   Calcium 9.5 8.9 - 10.3 mg/dL   GFR calc non Af Amer >60 >60 mL/min   GFR calc Af Amer >60 >60 mL/min   Anion gap 9 5 - 15    Comment: Performed at [facility], [address], [place], [place] [address]  Hepatic function panel     Status: None   Collection Time: 01/28/19  7:17 PM  Result Value Ref Range   Total Protein  6.9 6.5 - 8.1 g/dL   Albumin 4.0 3.5 - 5.0 g/dL   AST 26 15 - 41 U/L   ALT 15 0 - 44 U/L   Alkaline Phosphatase 67 38 - 126 U/L   Total Bilirubin 0.7 0.3 - 1.2 mg/dL   Bilirubin, Direct 0.1 0.0 - 0.2 mg/dL   Indirect Bilirubin 0.6 0.3 - 0.9 mg/dL    Comment: Performed at [facility], [place], [place] [address]  Troponin I (High Sensitivity)     Status: None   Collection Time: 01/28/19  7:31 PM  Result Value Ref Range   Troponin I (High Sensitivity) 8 <18 ng/L    Comment: (NOTE) Elevated high sensitivity troponin I (hsTnI) values and significant  changes across serial measurements may suggest ACS but many other  chronic and acute conditions are known to elevate hsTnI results.  Refer to the "Links" section for chest pain algorithms and additional   guidance. Performed at [facility], [address] [place], [place] [address]   Lipase, blood     Status: None   Collection Time: 01/28/19  7:46 PM  Result Value Ref Range   Lipase 37 11 - 51 U/L    Comment: Performed at [facility], [address] [place], [place] [address]  CBC     Status: None   Collection Time: 01/28/19  7:46 PM  Result Value Ref Range   WBC 8.7 4.0 - 10.5 K/uL   RBC 4.64 3.87 - 5.11 MIL/uL   Hemoglobin 13.8 12.0 - 15.0 g/dL   HCT 41.5 36.0 - 46.0 %   MCV 89.4 80.0 - 100.0 fL   MCH 29.7 26.0 - 34.0 pg   MCHC 33.3 30.0 - 36.0 g/dL   RDW 12.7 11.5 - 15.5 %   Platelets 224 150 - 400 K/uL   nRBC 0.0 0.0 - 0.2 %    Comment: Performed at [facility], [place], [place] [address]  Troponin I (High Sensitivity)     Status: None   Collection Time: 01/28/19  9:42 PM  Result Value Ref Range   Troponin I (High Sensitivity) 11 <18 ng/L    Comment: (NOTE) Elevated high sensitivity troponin I (hsTnI) values and significant  changes across serial measurements may suggest ACS but many other  chronic and acute conditions are known to elevate hsTnI results.  Refer to the "Links" section for chest pain algorithms and additional  guidance. Performed at [facility], [address] [place], [place] [address]   Urinalysis, Complete w Microscopic     Status: Abnormal   Collection Time: 01/28/19 10:27 PM  Result Value Ref Range   Color, Urine STRAW (A) YELLOW   APPearance CLEAR (A) CLEAR   Specific Gravity, Urine 1.009 1.005 - 1.030   pH 6.0 5.0 - 8.0   Glucose, UA NEGATIVE NEGATIVE mg/dL   Hgb urine dipstick NEGATIVE NEGATIVE   Bilirubin Urine NEGATIVE NEGATIVE   Ketones, ur 5 (A) NEGATIVE mg/dL   Protein, ur NEGATIVE NEGATIVE mg/dL   Nitrite NEGATIVE NEGATIVE   Leukocytes,Ua NEGATIVE NEGATIVE   RBC / HPF 0-5 0 - 5 RBC/hpf   WBC, UA 0-5 0 - 5 WBC/hpf   Bacteria, UA NONE SEEN NONE SEEN   Squamous Epithelial  / LPF NONE SEEN 0 - 5    Comment: Performed at [facility], [address] [place], [place] [address]   Ct Abdomen Pelvis W Contrast  Result Date: 01/29/2019  CLINICAL DATA:  Worsening abdominal pain EXAM: CT ABDOMEN AND PELVIS WITH CONTRAST TECHNIQUE: Multidetector CT imaging of the abdomen and pelvis was performed using the standard protocol following bolus administration of intravenous contrast. CONTRAST:  149mL OMNIPAQUE IOHEXOL 300 MG/ML  SOLN COMPARISON:  August 20, 2018 FINDINGS: Lower chest: There is stable pulmonary nodules at the right lung base and within the right middle lobe.Heart size is enlarged. Hepatobiliary: The liver is normal. Normal gallbladder.The common bile duct is dilated. Pancreas: The pancreatic duct is dilated but this is similar to prior studies. There are few small calcifications in the pancreas likely related to the patient's reported history of pancreatitis. Spleen: No splenic laceration or hematoma. Adrenals/Urinary Tract: --Adrenal glands: A benign appearing right adrenal adenoma is again noted. The left adrenal gland is unremarkable. --Right kidney/ureter: Multiple small cysts are again noted. --Left kidney/ureter: Multiple small left-sided renal cysts are again noted. There are some vascular calcifications. --Urinary bladder: Unremarkable. Stomach/Bowel: --Stomach/Duodenum: No hiatal hernia or other gastric abnormality. Normal duodenal course and caliber. --Small bowel: No dilatation or inflammation. --Colon: Moderate amount of stool throughout the colon --Appendix: Not visualized. No right lower quadrant inflammation or free fluid. Vascular/Lymphatic: Atherosclerotic calcification is present within the non-aneurysmal abdominal aorta, without hemodynamically significant stenosis. --No retroperitoneal lymphadenopathy. --No mesenteric lymphadenopathy. --No pelvic or inguinal lymphadenopathy. Reproductive: Unremarkable Other: No ascites or free air. The abdominal wall  is normal. Musculoskeletal. No acute displaced fractures. IMPRESSION: 1. No acute abdominal abnormality detected. 2. Moderate stool burden. 3.  Aortic Atherosclerosis (ICD10-I70.0). Electronically Signed   By: Constance Holster M.D.   On: 01/29/2019 00:14    Pending Labs Unresulted Labs (From admission, onward)    Start     Ordered   01/29/19 0500  Comprehensive metabolic panel  Tomorrow morning,   STAT     01/29/19 0236   01/29/19 0500  CBC  Tomorrow morning,   STAT     01/29/19 0236   01/29/19 0231  HIV antibody (Routine Testing)  Once,   STAT     01/29/19 0236   01/28/19 2318  SARS CORONAVIRUS 2 Nasal Swab Aptima Multi Swab  (Asymptomatic/Tier 2 Patients Labs)  Once,   STAT    Question Answer Comment  Is this test for diagnosis or screening Screening   Symptomatic for COVID-19 as defined by CDC No   Hospitalized for COVID-19 No   Admitted to ICU for COVID-19 No   Previously tested for COVID-19 No   Resident in a congregate (group) care setting No   Employed in healthcare setting No   Pregnant No      01/28/19 2317          Vitals/Pain Today's Vitals   01/28/19 2237 01/28/19 2300 01/28/19 2330 01/29/19 0043  BP:  136/85 135/86   Pulse:  (!) 53 (!) 57   Resp:  18 13   Temp:      TempSrc:      SpO2:  97% 95%   Weight:      Height:      PainSc: 5    4     Isolation Precautions No active isolations  Medications Medications  amLODipine (NORVASC) tablet 10 mg (has no administration in time range)  lisinopril (ZESTRIL) tablet 40 mg (has no administration in time range)  metoprolol succinate (TOPROL-XL) 24 hr tablet 200 mg (has no administration in time range)  PARoxetine (PAXIL) tablet 20 mg (has no administration in time range)  pantoprazole (PROTONIX) EC tablet 40 mg (has  no administration in time range)  apixaban (ELIQUIS) tablet 5 mg (has no administration in time range)  divalproex (DEPAKOTE) DR tablet 250 mg (has no administration in time range)  0.9 %  sodium  chloride infusion (has no administration in time range)  acetaminophen (TYLENOL) tablet 650 mg (has no administration in time range)    Or  acetaminophen (TYLENOL) suppository 650 mg (has no administration in time range)  traZODone (DESYREL) tablet 25 mg (has no administration in time range)  magnesium hydroxide (MILK OF MAGNESIA) suspension 30 mL (has no administration in time range)  ondansetron (ZOFRAN) tablet 4 mg (has no administration in time range)    Or  ondansetron (ZOFRAN) injection 4 mg (has no administration in time range)  morphine 2 MG/ML injection 2 mg (has no administration in time range)  metoCLOPramide (REGLAN) injection 5 mg (has no administration in time range)  ondansetron (ZOFRAN) injection 4 mg (4 mg Intravenous Given 01/28/19 1947)  morphine 4 MG/ML injection 4 mg (4 mg Intravenous Given 01/28/19 1949)  lactated ringers bolus 1,000 mL (0 mLs Intravenous Stopped 01/29/19 0043)  ondansetron (ZOFRAN) injection 4 mg (4 mg Intravenous Given 01/28/19 2203)  morphine 4 MG/ML injection 4 mg (4 mg Intravenous Given 01/28/19 2203)  iohexol (OMNIPAQUE) 300 MG/ML solution 100 mL (100 mLs Intravenous Contrast Given 01/28/19 2341)  morphine 4 MG/ML injection 4 mg (4 mg Intravenous Given 01/29/19 0003)  ketorolac (TORADOL) 30 MG/ML injection 15 mg (15 mg Intravenous Given 01/29/19 0223)    Mobility walks Low fall risk   Focused Assessments Gastrointestinal   R Recommendations: See Admitting Provider Note  Report given to:   Additional Notes:

## 2019-01-30 LAB — HIV ANTIBODY (ROUTINE TESTING W REFLEX): HIV Screen 4th Generation wRfx: NONREACTIVE

## 2019-02-07 ENCOUNTER — Emergency Department
Admission: EM | Admit: 2019-02-07 | Discharge: 2019-02-07 | Disposition: A | Discharge status: Home / Self Care | Payer: Medicare PPO | Attending: Emergency Medicine | Admitting: Emergency Medicine

## 2019-02-07 ENCOUNTER — Other Ambulatory Visit: Payer: Self-pay

## 2019-02-07 ENCOUNTER — Encounter: Payer: Self-pay | Admitting: Emergency Medicine

## 2019-02-07 DIAGNOSIS — Z950 Presence of cardiac pacemaker: Secondary | ICD-10-CM | POA: Diagnosis not present

## 2019-02-07 DIAGNOSIS — I509 Heart failure, unspecified: Secondary | ICD-10-CM | POA: Diagnosis not present

## 2019-02-07 DIAGNOSIS — N189 Chronic kidney disease, unspecified: Secondary | ICD-10-CM | POA: Diagnosis not present

## 2019-02-07 DIAGNOSIS — Z7901 Long term (current) use of anticoagulants: Secondary | ICD-10-CM | POA: Insufficient documentation

## 2019-02-07 DIAGNOSIS — Z79899 Other long term (current) drug therapy: Secondary | ICD-10-CM | POA: Insufficient documentation

## 2019-02-07 DIAGNOSIS — K861 Other chronic pancreatitis: Secondary | ICD-10-CM | POA: Diagnosis not present

## 2019-02-07 DIAGNOSIS — F17210 Nicotine dependence, cigarettes, uncomplicated: Secondary | ICD-10-CM | POA: Diagnosis not present

## 2019-02-07 DIAGNOSIS — R1013 Epigastric pain: Secondary | ICD-10-CM

## 2019-02-07 DIAGNOSIS — I130 Hypertensive heart and chronic kidney disease with heart failure and stage 1 through stage 4 chronic kidney disease, or unspecified chronic kidney disease: Secondary | ICD-10-CM | POA: Insufficient documentation

## 2019-02-07 DIAGNOSIS — R112 Nausea with vomiting, unspecified: Secondary | ICD-10-CM

## 2019-02-07 LAB — URINALYSIS, COMPLETE (UACMP) WITH MICROSCOPIC
Bilirubin Urine: NEGATIVE
Glucose, UA: NEGATIVE mg/dL
Hgb urine dipstick: NEGATIVE
Ketones, ur: NEGATIVE mg/dL
Nitrite: NEGATIVE
Protein, ur: NEGATIVE mg/dL
Specific Gravity, Urine: 1.014 (ref 1.005–1.030)
pH: 5 (ref 5.0–8.0)

## 2019-02-07 LAB — COMPREHENSIVE METABOLIC PANEL
ALT: 12 U/L (ref 0–44)
AST: 25 U/L (ref 15–41)
Albumin: 3.5 g/dL (ref 3.5–5.0)
Alkaline Phosphatase: 52 U/L (ref 38–126)
Anion gap: 8 (ref 5–15)
BUN: 29 mg/dL — ABNORMAL HIGH (ref 8–23)
CO2: 20 mmol/L — ABNORMAL LOW (ref 22–32)
Calcium: 9.2 mg/dL (ref 8.9–10.3)
Chloride: 114 mmol/L — ABNORMAL HIGH (ref 98–111)
Creatinine, Ser: 0.87 mg/dL (ref 0.44–1.00)
GFR calc Af Amer: >60 mL/min (ref >60)
GFR calc non Af Amer: >60 mL/min (ref >60)
Glucose, Bld: 148 mg/dL — ABNORMAL HIGH (ref 70–99)
Potassium: 4.4 mmol/L (ref 3.5–5.1)
Sodium: 142 mmol/L (ref 135–145)
Total Bilirubin: 0.7 mg/dL (ref 0.3–1.2)
Total Protein: 6.3 g/dL — ABNORMAL LOW (ref 6.5–8.1)

## 2019-02-07 LAB — LIPASE, BLOOD: Lipase: 56 U/L — ABNORMAL HIGH (ref 11–51)

## 2019-02-07 LAB — CBC
HCT: 40.7 % (ref 36.0–46.0)
Hemoglobin: 13.5 g/dL (ref 12.0–15.0)
MCH: 29.7 pg (ref 26.0–34.0)
MCHC: 33.2 g/dL (ref 30.0–36.0)
MCV: 89.5 fL (ref 80.0–100.0)
Platelets: 209 10*3/uL (ref 150–400)
RBC: 4.55 MIL/uL (ref 3.87–5.11)
RDW: 12.9 % (ref 11.5–15.5)
WBC: 9.2 10*3/uL (ref 4.0–10.5)
nRBC: 0 % (ref 0.0–0.2)

## 2019-02-07 MED ORDER — SODIUM CHLORIDE 0.9% FLUSH
3.0000 mL | Freq: Once | INTRAVENOUS | Status: AC
  Administered 2019-02-07: 14:00:00 3 mL via INTRAVENOUS

## 2019-02-07 MED ORDER — ONDANSETRON 4 MG PO TBDP: 4.0000 mg | ORAL_TABLET | Freq: Three times a day (TID) | ORAL | 0 refills | Status: AC | PRN

## 2019-02-07 MED ORDER — HYDROCODONE-ACETAMINOPHEN 5-325 MG PO TABS: 1.0000 | ORAL_TABLET | ORAL | 0 refills | Status: AC | PRN

## 2019-02-07 MED ORDER — LACTATED RINGERS IV BOLUS
1000.0000 mL | Freq: Once | INTRAVENOUS | Status: AC
  Administered 2019-02-07: 1000 mL via INTRAVENOUS

## 2019-02-07 MED ORDER — ONDANSETRON HCL 4 MG/2ML IJ SOLN
4.0000 mg | Freq: Once | INTRAMUSCULAR | Status: AC
  Administered 2019-02-07: 14:00:00 4 mg via INTRAVENOUS
  Filled 2019-02-07: qty 2

## 2019-02-07 MED ORDER — MORPHINE SULFATE (PF) 4 MG/ML IV SOLN
4.0000 mg | Freq: Once | INTRAVENOUS | Status: AC
  Administered 2019-02-07: 4 mg via INTRAVENOUS
  Filled 2019-02-07: qty 1

## 2019-02-07 NOTE — ED Provider Notes (Signed)
[facility] Emergency Department Provider Note   ____________________________________________   First MD Initiated Contact with Patient 02/07/19 1343     (approximate)  I have reviewed the triage vital signs and the nursing notes.   HISTORY  Chief Complaint Emesis and Abdominal Pain    HPI Lori Orr is a 68 y.o. female with past medical history of chronic pancreatitis, sick sinus syndrome status post pacemaker, hypertension, CKD who presents to the ED complaining of abdominal pain.  Patient reports that she has had acute worsening of her chronic epigastric and left upper quadrant abdominal pain over the past 24 hours.  She describes multiple episodes of nonbilious and nonbloody vomiting, denies any associated diarrhea.  She states the pain is similar to her typical pain, which she attributes to pancreatitis.  She was recently admitted for similar symptoms, with unremarkable CT at the time.  She denies any fevers, chills, chest pain, cough, or shortness of breath.  She has been taking nausea medicine at home without relief.        Past Medical History:  Diagnosis Date  . Bradycardia   . Cancer ([place])    ovarian cancer  oophrectomy w/ only "1/4 ovary remaining"  . CHF (congestive heart failure) ([place])   . Chronic kidney disease   . Depression   . Hypertension   . Pancreatitis   . PTSD (post-traumatic stress disorder)   . SSS (sick sinus syndrome) ([place])    PPM implant 03/08/11 - [employer] [phone] - SN [id number]    Patient Active Problem List   Diagnosis Date Noted  . Intractable nausea and vomiting 01/29/2019  . Abscess, gluteal, left   . TIA (transient ischemic attack) 08/15/2017  . Psychophysiological insomnia 05/19/2017  . Bipolar disorder, current episode mixed, moderate ([place]) 04/17/2017  . Recurrent pancreatitis 04/17/2017  . Ready to quit smoking 04/17/2017  . Atypical chest pain 02/25/2017  . Abnormal EKG 02/25/2017  . Cardiac  pacemaker in situ 03/29/2016  . History of malignant neoplasm of cervix 03/29/2016  . Hypertensive disorder 03/29/2016  . Kidney stone 03/29/2016  . Multiple nodules of lung 03/29/2016  . Acute pyelonephritis 07/26/2014    Past Surgical History:  Procedure Laterality Date  . ABDOMINAL HYSTERECTOMY    . APPENDECTOMY    . PACEMAKER IMPLANT  03/08/2011   [employer] [id number] [phone]) - Implanted For SSS    Prior to Admission medications   Medication Sig Start Date End Date Taking? Authorizing Provider  amLODipine (NORVASC) 10 MG tablet Take 10 mg by mouth daily. 11/03/18   [provider]  apixaban (ELIQUIS) 5 MG TABS tablet Take 1 tablet (5 mg total) by mouth 2 (two) times daily. 01/09/19   Deboraha Sprang, MD  divalproex (DEPAKOTE) 250 MG DR tablet Take 1 tablet (250 mg total) by mouth 3 (three) times daily. mood 10/22/18   Ursula Alert, MD  HYDROcodone-acetaminophen (NORCO/VICODIN) 5-325 MG tablet Take 1 tablet by mouth every 4 (four) hours as needed for moderate pain. 02/07/19 02/07/20  Blake Divine, MD  lisinopril (ZESTRIL) 40 MG tablet Take 1 tablet (40 mg total) by mouth daily. 11/16/18   Karamalegos, Devonne Doughty, DO  metoprolol (TOPROL-XL) 200 MG 24 hr tablet Take 1 tablet (200 mg total) by mouth daily. 07/17/18   Mikey College, NP  ondansetron (ZOFRAN ODT) 4 MG disintegrating tablet Take 1 tablet (4 mg total) by mouth every 8 (eight) hours as needed for nausea or vomiting. 02/07/19   Charna Archer,  Juanda Crumble, MD  ondansetron (ZOFRAN) 4 MG tablet Take 1 tablet (4 mg total) by mouth every 8 (eight) hours as needed for nausea or vomiting. 01/29/19   Loletha Grayer, MD  oxyCODONE (ROXICODONE) 5 MG immediate release tablet Take 1 tablet (5 mg total) by mouth every 8 (eight) hours as needed for moderate pain or severe pain. 01/29/19 01/29/20  Loletha Grayer, MD  pantoprazole (PROTONIX) 40 MG tablet Take 40 mg by mouth daily. 12/12/18   [provider]  PARoxetine (PAXIL)  20 MG tablet Take 1 tablet (20 mg total) by mouth daily. 11/14/18   Deboraha Sprang, MD  polyethylene glycol (MIRALAX / GLYCOLAX) 17 g packet Take 17 g by mouth daily. 01/29/19   Loletha Grayer, MD    Allergies Sulfa antibiotics, Tramadol, and Trazodone and nefazodone  Family History  Problem Relation Age of Onset  . Dementia Mother   . Heart attack Father        Died age 4  . Heart disease Sister        Congential  . Heart disease Sister        "Some kind of heart surgery."  . Fibromyalgia Daughter   . Cancer Brother   . Cancer Brother     Social History Social History   Tobacco Use  . Smoking status: Current Every Day Smoker    Packs/day: 0.50    Years: 50.00    Pack years: 25.00    Types: Cigarettes  . Smokeless tobacco: Never Used  . Tobacco comment: one pack every 3 days  Substance Use Topics  . Alcohol use: No  . Drug use: No    Review of Systems  Constitutional: No fever/chills Eyes: No visual changes. ENT: No sore throat. Cardiovascular: Denies chest pain. Respiratory: Denies shortness of breath. Gastrointestinal: Positive for abdominal pain, nausea, vomiting.  No diarrhea.  No constipation. Genitourinary: Negative for dysuria. Musculoskeletal: Negative for back pain. Skin: Negative for rash. Neurological: Negative for headaches, focal weakness or numbness.  ____________________________________________   PHYSICAL EXAM:  VITAL SIGNS: ED Triage Vitals  Enc Vitals Group     BP 02/07/19 1323 124/66     Pulse Rate 02/07/19 1323 61     Resp 02/07/19 1323 16     Temp 02/07/19 1323 98.7 F (37.1 C)     Temp Source 02/07/19 1323 Oral     SpO2 02/07/19 1323 97 %     Weight 02/07/19 1324 117 lb 11.6 oz (53.4 kg)     Height 02/07/19 1324 5\' 3"  (1.6 m)     Head Circumference --      Peak Flow --      Pain Score 02/07/19 1323 10     Pain Loc --      Pain Edu? --      Excl. in [place]? --     Constitutional: Alert and oriented. Eyes: Conjunctivae are  normal. Head: Atraumatic. Nose: No congestion/rhinnorhea. Mouth/Throat: Mucous membranes are moist. Neck: Normal ROM Cardiovascular: Normal rate, regular rhythm. Grossly normal heart sounds. Respiratory: Normal respiratory effort.  No retractions. Lungs CTAB. Gastrointestinal: Soft and tender to palpation in the epigastrium and left upper quadrant. No distention. Genitourinary: deferred Musculoskeletal: No lower extremity tenderness nor edema. Neurologic:  Normal speech and language. No gross focal neurologic deficits are appreciated. Skin:  Skin is warm, dry and intact. No rash noted. Psychiatric: Mood and affect are normal. Speech and behavior are normal.  ____________________________________________   LABS (all labs ordered are listed, but  only abnormal results are displayed)  Labs Reviewed  LIPASE, BLOOD - Abnormal; Notable for the following components:      Result Value   Lipase 56 (*)    All other components within normal limits  COMPREHENSIVE METABOLIC PANEL - Abnormal; Notable for the following components:   Chloride 114 (*)    CO2 20 (*)    Glucose, Bld 148 (*)    BUN 29 (*)    Total Protein 6.3 (*)    All other components within normal limits  URINALYSIS, COMPLETE (UACMP) WITH MICROSCOPIC - Abnormal; Notable for the following components:   Color, Urine YELLOW (*)    APPearance CLEAR (*)    Leukocytes,Ua TRACE (*)    Bacteria, UA FEW (*)    All other components within normal limits  CBC   ____________________________________________  EKG  ED ECG REPORT I, Blake Divine, the attending physician, personally viewed and interpreted this ECG.   Date: 02/07/2019  EKG Time: 13:32  Rate: 62  Rhythm: Atrially paced rhythm  Axis: LAD  Intervals:none  ST&T Change: None    PROCEDURES  Procedure(s) performed (including Critical Care):  Procedures   ____________________________________________   INITIAL IMPRESSION / ASSESSMENT AND PLAN / ED COURSE        68 year old female with history of chronic pancreatitis presents to the ED with acute on chronic abdominal pain.  She describes pain as similar to prior flareups, do not feel CT would be beneficial at this time as patient had negative CT abdomen/pelvis performed during recent admission.  Will check labs, including LFTs and lipase, treat symptomatically.  Do not suspect ACS as EKG without acute ischemic changes, symptoms more consistent with GI related cause.  Labs remarkable for mildly elevated lipase, consistent with some mild acute on chronic pancreatitis.  Remainder of labs unremarkable, UA without evidence of infection.  Patient reports feeling much better, pain improved and no further nausea or vomiting here in the ED.  Counseled patient on need to follow-up with GI and referral provided.  Will provide short course of pain medicine along with nausea medicine, counseled to return to the ED for new or worsening symptoms.  Patient agrees with plan.      ____________________________________________   FINAL CLINICAL IMPRESSION(S) / ED DIAGNOSES  Final diagnoses:  Idiopathic chronic pancreatitis (HCC)  Epigastric pain  Non-intractable vomiting with nausea, unspecified vomiting type     ED Discharge Orders         Ordered    ondansetron (ZOFRAN ODT) 4 MG disintegrating tablet  Every 8 hours PRN     02/07/19 1632    HYDROcodone-acetaminophen (NORCO/VICODIN) 5-325 MG tablet  Every 4 hours PRN     02/07/19 1632           Note:  This document was prepared using Dragon voice recognition software and may include unintentional dictation errors.   Blake Divine, MD 02/07/19 2018

## 2019-02-07 NOTE — ED Triage Notes (Signed)
Pt [facility] the ED [facility] [employer] with c/o left sided abd pain with N/V with hx of chronic pancreatitis. Pt is ambulatory to triage with no distress noted.

## 2019-03-14 DEATH — deceased

## 2019-05-08 LAB — CUP PACEART INCLINIC DEVICE CHECK
Brady Statistic RA Percent Paced: 79 %
Brady Statistic RV Percent Paced: 2 %
Date Time Interrogation Session: 20200728000000
Implantable Lead Implant Date: 20120925
Implantable Lead Implant Date: 20120925
Implantable Lead Location: 753859
Implantable Lead Location: 753860
Implantable Lead Model: 4135
Implantable Lead Model: 4136
Implantable Lead Serial Number: 28984048
Implantable Lead Serial Number: 29041902
Implantable Pulse Generator Implant Date: 20120925
Lead Channel Impedance Value: 500 Ohm
Lead Channel Impedance Value: 600 Ohm
Lead Channel Pacing Threshold Amplitude: 0.7 V
Lead Channel Pacing Threshold Amplitude: 1.2 V
Lead Channel Pacing Threshold Pulse Width: 0.4 ms
Lead Channel Pacing Threshold Pulse Width: 0.4 ms
Lead Channel Sensing Intrinsic Amplitude: 10.5 mV
Lead Channel Sensing Intrinsic Amplitude: 3 mV
Lead Channel Setting Pacing Amplitude: 2 V
Lead Channel Setting Pacing Amplitude: 2.4 V
Lead Channel Setting Pacing Pulse Width: 0.4 ms
Lead Channel Setting Sensing Sensitivity: 2.5 mV
Pulse Gen Serial Number: 189779
